# Patient Record
Sex: Female | Born: 1950 | Race: Black or African American | Hispanic: No | Smoking: Never smoker
Health system: Southern US, Community
[De-identification: ages and names within clinical notes are randomized; demographics above are authoritative.]

## PROBLEM LIST (undated history)

## (undated) DIAGNOSIS — M1990 Unspecified osteoarthritis, unspecified site: Secondary | ICD-10-CM

## (undated) HISTORY — PX: LAPAROSCOPIC GASTRIC BANDING: SHX1100

## (undated) HISTORY — DX: Unspecified osteoarthritis, unspecified site: M19.90

---

## 2018-01-21 HISTORY — PX: APPENDECTOMY: SHX54

## 2018-02-08 ENCOUNTER — Emergency Department (HOSPITAL_COMMUNITY): Payer: Medicare Other

## 2018-02-08 ENCOUNTER — Emergency Department (HOSPITAL_COMMUNITY)
Admission: EM | Admit: 2018-02-08 | Discharge: 2018-02-08 | Disposition: A | Discharge status: Home / Self Care | Payer: Medicare Other | Attending: Emergency Medicine | Admitting: Emergency Medicine

## 2018-02-08 ENCOUNTER — Encounter (HOSPITAL_COMMUNITY): Payer: Self-pay | Admitting: Emergency Medicine

## 2018-02-08 ENCOUNTER — Other Ambulatory Visit: Payer: Self-pay

## 2018-02-08 DIAGNOSIS — M25562 Pain in left knee: Secondary | ICD-10-CM | POA: Diagnosis present

## 2018-02-08 DIAGNOSIS — G8929 Other chronic pain: Secondary | ICD-10-CM | POA: Diagnosis not present

## 2018-02-08 MED ORDER — ONDANSETRON 4 MG PO TBDP
4.0000 mg | ORAL_TABLET | Freq: Once | ORAL | Status: AC
  Administered 2018-02-08: 4 mg via ORAL
  Filled 2018-02-08: qty 1

## 2018-02-08 MED ORDER — ONDANSETRON HCL 4 MG PO TABS: 4.0000 mg | ORAL_TABLET | Freq: Four times a day (QID) | ORAL | 0 refills | Status: DC

## 2018-02-08 MED ORDER — HYDROCODONE-ACETAMINOPHEN 5-325 MG PO TABS
1.0000 | ORAL_TABLET | Freq: Once | ORAL | Status: AC
  Administered 2018-02-08: 1 via ORAL
  Filled 2018-02-08: qty 1

## 2018-02-08 MED ORDER — OXYCODONE-ACETAMINOPHEN 5-325 MG PO TABS: 2.0000 | ORAL_TABLET | Freq: Three times a day (TID) | ORAL | 0 refills | Status: DC | PRN

## 2018-02-08 NOTE — ED Triage Notes (Signed)
States has chronic left knee pain and and past 2 days pain left knee worsening and currently 10/10 sharp with swelling. Pedal pulse radial pulse +2 full sensation. Denies trauma. States since the new have been exercising more.

## 2018-02-08 NOTE — Discharge Instructions (Addendum)
You may alternate taking Tylenol and Ibuprofen as needed for pain control. You may take 400-600 mg of ibuprofen every 6 hours and 500mg  of Tylenol every 6 hours. Do not exceed 4000 mg of Tylenol daily as this can lead to liver damage. Also, make sure to take Ibuprofen with meals as it can cause an upset stomach. Do not take other NSAIDs while taking Ibuprofen such as (Aleve, Naprosyn, Aspirin, Celebrex, etc) and do not take more than the prescribed dose as this can lead to ulcers and bleeding in your GI tract. You may use warm and cold compresses to help with your symptoms.   Prescription given for Percocet. You may take one percocet every 8 hours for breakthrough pain.Take medication as directed and do not operate machinery, drive a car, or work while taking this medication as it can make you drowsy.   Please follow up with your primary doctor within the next 7-10 days for re-evaluation and further treatment of your symptoms.  You were Given a referral to an orthopedic doctor.  Please call the office to make an appointment for follow-up  Please return to the ER sooner if you have any new or worsening symptoms.

## 2018-02-08 NOTE — ED Provider Notes (Signed)
[facility] EMERGENCY DEPARTMENT Provider Note   CSN: [id number] Arrival date & time: 02/08/18  1152     History   Chief Complaint Chief Complaint  Patient presents with  . Knee Pain    HPI Julie Richards is a 68 y.o. female.  HPI  Pt is a 68 y/o female who presents to the ED today c/o left knee pain.  Patient states her left knee pain that is chronic.  States she has a history of being a runner and is always had knee pain however over the last several days it seemed to worsen.  Describes her pain as 10/10, sharp with associated swelling.  It is worse with movement and when she tries to walk.  She states that the symptoms started after she has started exercising over the last few week.  She has been walking more.  History reviewed. No pertinent past medical history.  There are no active problems to display for this patient.   History reviewed. No pertinent surgical history.   OB History   No obstetric history on file.      Home Medications    Prior to Admission medications   Medication Sig Start Date End Date Taking? Authorizing Provider  ondansetron (ZOFRAN) 4 MG tablet Take 1 tablet (4 mg total) by mouth every 6 (six) hours. 02/08/18   Ehtan Delfavero S, PA-C  oxyCODONE-acetaminophen (PERCOCET/ROXICET) 5-325 MG tablet Take 2 tablets by mouth every 8 (eight) hours as needed for severe pain. 02/08/18   Glanda Spanbauer S, PA-C    Family History No family history on file.  Social History Social History   Tobacco Use  . Smoking status: Never Smoker  Substance Use Topics  . Alcohol use: Never    Frequency: Never  . Drug use: Never     Allergies   Percocet [oxycodone-acetaminophen]   Review of Systems Review of Systems  Constitutional: Negative for fever.  Musculoskeletal:       Left knee pain  Skin: Negative for color change and wound.     Physical Exam Updated Vital Signs BP 123/73 (BP Location: Right Arm)   Pulse 60   Temp  97.6 F (36.4 C) (Oral)   Resp 16   Ht 5\' 9"  (1.753 m)   Wt 133.8 kg   SpO2 98%   BMI 43.56 kg/m   Physical Exam Vitals signs and nursing note reviewed.  Constitutional:      General: She is not in acute distress.    Appearance: She is well-developed.  HENT:     Head: Normocephalic and atraumatic.  Eyes:     Conjunctiva/sclera: Conjunctivae normal.  Neck:     Musculoskeletal: Neck supple.  Cardiovascular:     Rate and Rhythm: Normal rate.  Pulmonary:     Effort: Pulmonary effort is normal.  Musculoskeletal:     Comments: TTP to the left knee over the medial joint line. There is a mild amount of swelling present however no erythema or warmth. No obvious joint laxity on exam. Is able to bend knee to 90 degrees.   Skin:    General: Skin is warm and dry.  Neurological:     Mental Status: She is alert.      ED Treatments / Results  Labs (all labs ordered are listed, but only abnormal results are displayed) Labs Reviewed - No data to display  EKG None  Radiology Dg Knee Complete 4 Views Left  Result Date: 02/08/2018 CLINICAL DATA:  Left knee pain  on treadmill. EXAM: LEFT KNEE - COMPLETE 4+ VIEW COMPARISON:  None. FINDINGS: No evidence of fracture, dislocation. There are degenerative joint changes of the left knee with narrowed joint space osteophyte formation and a small suprapatellar effusion. Soft tissues are unremarkable. IMPRESSION: No acute fracture or dislocation identified. Moderate osteoarthritic changes of left knee. Electronically Signed   By: Sherian Rein M.D.   On: 02/08/2018 14:49    Procedures Procedures (including critical care time) SPLINT APPLICATION Date/Time: 5:01 PM Authorized by: Karrie Meres Consent: Verbal consent obtained. Risks and benefits: risks, benefits and alternatives were discussed Consent given by: patient Splint applied by: orthopedic technician Location details: LLE Splint type: knee immobilizer Post-procedure: The splinted  body part was neurovascularly unchanged following the procedure. Patient tolerance: Patient tolerated the procedure well with no immediate complications.  Medications Ordered in ED Medications  HYDROcodone-acetaminophen (NORCO/VICODIN) 5-325 MG per tablet 1 tablet (1 tablet Oral Given 02/08/18 1246)  ondansetron (ZOFRAN-ODT) disintegrating tablet 4 mg (4 mg Oral Given 02/08/18 1247)     Initial Impression / Assessment and Plan / ED Course  I have reviewed the triage vital signs and the nursing notes.  Pertinent labs & imaging results that were available during my care of the patient were reviewed by me and considered in my medical decision making (see chart for details).   Final Clinical Impressions(s) / ED Diagnoses   Final diagnoses:  Chronic pain of left knee   Patient presenting with left knee pain that is chronic but has worsened over the last few days started exercising more.  Vitals are stable.  No erythema or warmth to the joint to suggest septic arthritis.  No fevers.  No recent falls or trauma.  On exam has tenderness to the medial aspect of the left knee.  X-ray right knee without evidence of fracture.  Advise anti-inflammatories and pain medications.  Knee immobilizer and crutches for pain comfort.  Will give referral to orthopedics however also advised patient to follow-up with her PCP in regards to symptoms.  Advised to return to the ER for new or worsening symptoms.  She voices understanding of the plan and reasons to return.  All questions answered.   ED Discharge Orders         Ordered    oxyCODONE-acetaminophen (PERCOCET/ROXICET) 5-325 MG tablet  Every 8 hours PRN     02/08/18 1516    ondansetron (ZOFRAN) 4 MG tablet  Every 6 hours     02/08/18 1516           Julie Richards S, PA-C 02/08/18 1701    Derwood Kaplan, MD 02/08/18 1726

## 2018-02-08 NOTE — Progress Notes (Signed)
Orthopedic Tech Progress Note Patient Details:  Julie Richards 1950-10-23 [id number]  Ortho Devices Type of Ortho Device: Crutches, Knee Immobilizer Ortho Device/Splint Location: LLE Ortho Device/Splint Interventions: Adjustment, Application, Ordered   Post Interventions Patient Tolerated: Well Instructions Provided: Care of device, Adjustment of device   Donald Pore 02/08/2018, 1:09 PM

## 2018-05-29 ENCOUNTER — Encounter (HOSPITAL_BASED_OUTPATIENT_CLINIC_OR_DEPARTMENT_OTHER): Payer: Self-pay

## 2018-05-29 DIAGNOSIS — G4710 Hypersomnia, unspecified: Secondary | ICD-10-CM

## 2018-05-29 DIAGNOSIS — R0683 Snoring: Secondary | ICD-10-CM

## 2018-05-29 DIAGNOSIS — R5383 Other fatigue: Secondary | ICD-10-CM

## 2018-06-03 ENCOUNTER — Other Ambulatory Visit: Payer: Self-pay | Admitting: Internal Medicine

## 2018-06-03 DIAGNOSIS — Z1231 Encounter for screening mammogram for malignant neoplasm of breast: Secondary | ICD-10-CM

## 2018-07-09 ENCOUNTER — Other Ambulatory Visit: Payer: Self-pay

## 2018-07-09 ENCOUNTER — Inpatient Hospital Stay (HOSPITAL_COMMUNITY)
Admission: EM | Admit: 2018-07-09 | Discharge: 2018-07-16 | DRG: 372 | Disposition: A | Discharge status: Home / Self Care | Payer: Medicare Other | Attending: Physician Assistant | Admitting: Physician Assistant

## 2018-07-09 ENCOUNTER — Emergency Department (HOSPITAL_COMMUNITY): Payer: Medicare Other

## 2018-07-09 DIAGNOSIS — Z79899 Other long term (current) drug therapy: Secondary | ICD-10-CM | POA: Diagnosis not present

## 2018-07-09 DIAGNOSIS — R109 Unspecified abdominal pain: Secondary | ICD-10-CM | POA: Diagnosis not present

## 2018-07-09 DIAGNOSIS — N179 Acute kidney failure, unspecified: Secondary | ICD-10-CM | POA: Diagnosis not present

## 2018-07-09 DIAGNOSIS — Z885 Allergy status to narcotic agent status: Secondary | ICD-10-CM

## 2018-07-09 DIAGNOSIS — Z79891 Long term (current) use of opiate analgesic: Secondary | ICD-10-CM

## 2018-07-09 DIAGNOSIS — Z1159 Encounter for screening for other viral diseases: Secondary | ICD-10-CM | POA: Diagnosis not present

## 2018-07-09 DIAGNOSIS — K3532 Acute appendicitis with perforation and localized peritonitis, without abscess: Principal | ICD-10-CM | POA: Diagnosis present

## 2018-07-09 LAB — CBC WITH DIFFERENTIAL/PLATELET
Abs Immature Granulocytes: 0.07 10*3/uL (ref 0.00–0.07)
Basophils Absolute: 0 10*3/uL (ref 0.0–0.1)
Basophils Relative: 0 %
Eosinophils Absolute: 0 10*3/uL (ref 0.0–0.5)
Eosinophils Relative: 0 %
HCT: 43.4 % (ref 36.0–46.0)
Hemoglobin: 14 g/dL (ref 12.0–15.0)
Immature Granulocytes: 1 %
Lymphocytes Relative: 16 %
Lymphs Abs: 2.4 10*3/uL (ref 0.7–4.0)
MCH: 27.6 pg (ref 26.0–34.0)
MCHC: 32.3 g/dL (ref 30.0–36.0)
MCV: 85.4 fL (ref 80.0–100.0)
Monocytes Absolute: 0.5 10*3/uL (ref 0.1–1.0)
Monocytes Relative: 3 %
Neutro Abs: 12 10*3/uL — ABNORMAL HIGH (ref 1.7–7.7)
Neutrophils Relative %: 80 %
Platelets: 178 10*3/uL (ref 150–400)
RBC: 5.08 MIL/uL (ref 3.87–5.11)
RDW: 14.1 % (ref 11.5–15.5)
WBC: 14.9 10*3/uL — ABNORMAL HIGH (ref 4.0–10.5)
nRBC: 0 % (ref 0.0–0.2)

## 2018-07-09 LAB — URINALYSIS, ROUTINE W REFLEX MICROSCOPIC
Bilirubin Urine: NEGATIVE
Glucose, UA: NEGATIVE mg/dL
Ketones, ur: NEGATIVE mg/dL
Nitrite: NEGATIVE
Protein, ur: NEGATIVE mg/dL
Specific Gravity, Urine: 1.021 (ref 1.005–1.030)
pH: 6 (ref 5.0–8.0)

## 2018-07-09 LAB — COMPREHENSIVE METABOLIC PANEL
ALT: 19 U/L (ref 0–44)
AST: 19 U/L (ref 15–41)
Albumin: 3.7 g/dL (ref 3.5–5.0)
Alkaline Phosphatase: 77 U/L (ref 38–126)
Anion gap: 11 (ref 5–15)
BUN: 13 mg/dL (ref 8–23)
CO2: 23 mmol/L (ref 22–32)
Calcium: 9.3 mg/dL (ref 8.9–10.3)
Chloride: 102 mmol/L (ref 98–111)
Creatinine, Ser: 1.13 mg/dL — ABNORMAL HIGH (ref 0.44–1.00)
GFR calc Af Amer: 59 mL/min — ABNORMAL LOW (ref >60)
GFR calc non Af Amer: 51 mL/min — ABNORMAL LOW (ref >60)
Glucose, Bld: 172 mg/dL — ABNORMAL HIGH (ref 70–99)
Potassium: 3.7 mmol/L (ref 3.5–5.1)
Sodium: 136 mmol/L (ref 135–145)
Total Bilirubin: 1.6 mg/dL — ABNORMAL HIGH (ref 0.3–1.2)
Total Protein: 7.3 g/dL (ref 6.5–8.1)

## 2018-07-09 LAB — SARS CORONAVIRUS 2 BY RT PCR (HOSPITAL ORDER, PERFORMED IN ~~LOC~~ HOSPITAL LAB): SARS Coronavirus 2: NEGATIVE

## 2018-07-09 LAB — LIPASE, BLOOD: Lipase: 24 U/L (ref 11–51)

## 2018-07-09 MED ORDER — ACETAMINOPHEN 325 MG PO TABS
650.0000 mg | ORAL_TABLET | Freq: Four times a day (QID) | ORAL | Status: DC | PRN
  Administered 2018-07-09 – 2018-07-12 (×2): 650 mg via ORAL
  Filled 2018-07-09 (×2): qty 2

## 2018-07-09 MED ORDER — ONDANSETRON HCL 4 MG/2ML IJ SOLN
4.0000 mg | Freq: Once | INTRAMUSCULAR | Status: AC
  Administered 2018-07-09: 4 mg via INTRAVENOUS
  Filled 2018-07-09: qty 2

## 2018-07-09 MED ORDER — IOHEXOL 300 MG/ML  SOLN
100.0000 mL | Freq: Once | INTRAMUSCULAR | Status: AC | PRN
  Administered 2018-07-09: 100 mL via INTRAVENOUS

## 2018-07-09 MED ORDER — PIPERACILLIN-TAZOBACTAM 3.375 G IVPB
3.3750 g | Freq: Three times a day (TID) | INTRAVENOUS | Status: DC
  Administered 2018-07-09 – 2018-07-14 (×14): 3.375 g via INTRAVENOUS
  Filled 2018-07-09 (×13): qty 50

## 2018-07-09 MED ORDER — SODIUM CHLORIDE 0.9 % IV BOLUS
1000.0000 mL | Freq: Once | INTRAVENOUS | Status: AC
  Administered 2018-07-09: 1000 mL via INTRAVENOUS

## 2018-07-09 MED ORDER — ENOXAPARIN SODIUM 40 MG/0.4ML ~~LOC~~ SOLN
40.0000 mg | SUBCUTANEOUS | Status: DC
  Administered 2018-07-09 – 2018-07-14 (×6): 40 mg via SUBCUTANEOUS
  Filled 2018-07-09 (×7): qty 0.4

## 2018-07-09 MED ORDER — ONDANSETRON HCL 4 MG/2ML IJ SOLN: 4.0000 mg | Freq: Four times a day (QID) | INTRAMUSCULAR | Status: DC | PRN

## 2018-07-09 MED ORDER — MORPHINE SULFATE (PF) 4 MG/ML IV SOLN
4.0000 mg | Freq: Once | INTRAVENOUS | Status: AC
  Administered 2018-07-09: 4 mg via INTRAVENOUS
  Filled 2018-07-09: qty 1

## 2018-07-09 MED ORDER — SODIUM CHLORIDE 0.9 % IV SOLN
2.0000 g | Freq: Once | INTRAVENOUS | Status: AC
  Administered 2018-07-09: 2 g via INTRAVENOUS
  Filled 2018-07-09: qty 20

## 2018-07-09 MED ORDER — KCL IN DEXTROSE-NACL 20-5-0.9 MEQ/L-%-% IV SOLN
INTRAVENOUS | Status: DC
  Administered 2018-07-09 – 2018-07-13 (×9): via INTRAVENOUS
  Filled 2018-07-09 (×10): qty 1000

## 2018-07-09 MED ORDER — ONDANSETRON 4 MG PO TBDP
4.0000 mg | ORAL_TABLET | Freq: Four times a day (QID) | ORAL | Status: DC | PRN
  Filled 2018-07-09: qty 1

## 2018-07-09 MED ORDER — DICYCLOMINE HCL 10 MG PO CAPS
10.0000 mg | ORAL_CAPSULE | Freq: Once | ORAL | Status: AC
  Administered 2018-07-09: 10 mg via ORAL
  Filled 2018-07-09: qty 1

## 2018-07-09 MED ORDER — HYDRALAZINE HCL 20 MG/ML IJ SOLN: 10.0000 mg | INTRAMUSCULAR | Status: DC | PRN

## 2018-07-09 MED ORDER — FENTANYL CITRATE (PF) 100 MCG/2ML IJ SOLN
50.0000 ug | INTRAMUSCULAR | Status: DC | PRN
  Administered 2018-07-10 (×4): 50 ug via INTRAVENOUS
  Filled 2018-07-09 (×4): qty 2

## 2018-07-09 MED ORDER — ACETAMINOPHEN 650 MG RE SUPP: 650.0000 mg | Freq: Four times a day (QID) | RECTAL | Status: DC | PRN

## 2018-07-09 MED ORDER — METRONIDAZOLE IN NACL 5-0.79 MG/ML-% IV SOLN
500.0000 mg | Freq: Once | INTRAVENOUS | Status: AC
  Administered 2018-07-09: 500 mg via INTRAVENOUS
  Filled 2018-07-09: qty 100

## 2018-07-09 MED ORDER — MORPHINE SULFATE (PF) 4 MG/ML IV SOLN
4.0000 mg | Freq: Once | INTRAVENOUS | Status: AC
  Administered 2018-07-09: 14:00:00 4 mg via INTRAVENOUS
  Filled 2018-07-09: qty 1

## 2018-07-09 NOTE — H&P (Signed)
Julie Richards is an 68 y.o. female.   Chief Complaint: Abdominal pain x1 day HPI: Asked to see patient at request of EDP for abdominal pain.  She complains of a 1 day history of abdominal pain that started yesterday.  The pain progressed overnight and is described as sharp crampy in her lower abdomen.  There is no radiation.  Her pain worsened today in her lower abdomen now is more in her right lower quadrant.  Denies any dysuria polyuria or blood in her stool.  She is had no nausea vomiting.  Pain is sharp in nature made worse with movement.  CT scan shows perforated appendicitis without abscess or free air or free fluid.  No past medical history on file.  No past surgical history on file.  No family history on file. Social History:  reports that she has never smoked. She does not have any smokeless tobacco history on file. She reports that she does not drink alcohol or use drugs.  Allergies:  Allergies  Allergen Reactions  . Percocet [Oxycodone-Acetaminophen] Nausea And Vomiting    (Not in a hospital admission)   Results for orders placed or performed during the hospital encounter of 07/09/18 (from the past 48 hour(s))  Comprehensive metabolic panel     Status: Abnormal   Collection Time: 07/09/18  1:48 PM  Result Value Ref Range   Sodium 136 135 - 145 mmol/L   Potassium 3.7 3.5 - 5.1 mmol/L   Chloride 102 98 - 111 mmol/L   CO2 23 22 - 32 mmol/L   Glucose, Bld 172 (H) 70 - 99 mg/dL   BUN 13 8 - 23 mg/dL   Creatinine, Ser 6.961.13 (H) 0.44 - 1.00 mg/dL   Calcium 9.3 8.9 - 29.510.3 mg/dL   Total Protein 7.3 6.5 - 8.1 g/dL   Albumin 3.7 3.5 - 5.0 g/dL   AST 19 15 - 41 U/L   ALT 19 0 - 44 U/L   Alkaline Phosphatase 77 38 - 126 U/L   Total Bilirubin 1.6 (H) 0.3 - 1.2 mg/dL   GFR calc non Af Amer 51 (L) >60 mL/min   GFR calc Af Amer 59 (L) >60 mL/min   Anion gap 11 5 - 15    Comment: Performed at [facility], [address] [place], [place] [address]  CBC with Differential      Status: Abnormal   Collection Time: 07/09/18  1:48 PM  Result Value Ref Range   WBC 14.9 (H) 4.0 - 10.5 K/uL   RBC 5.08 3.87 - 5.11 MIL/uL   Hemoglobin 14.0 12.0 - 15.0 g/dL   HCT 24.443.4 01.036.0 - 27.246.0 %   MCV 85.4 80.0 - 100.0 fL   MCH 27.6 26.0 - 34.0 pg   MCHC 32.3 30.0 - 36.0 g/dL   RDW 53.614.1 64.411.5 - 03.415.5 %   Platelets 178 150 - 400 K/uL   nRBC 0.0 0.0 - 0.2 %   Neutrophils Relative % 80 %   Neutro Abs 12.0 (H) 1.7 - 7.7 K/uL   Lymphocytes Relative 16 %   Lymphs Abs 2.4 0.7 - 4.0 K/uL   Monocytes Relative 3 %   Monocytes Absolute 0.5 0.1 - 1.0 K/uL   Eosinophils Relative 0 %   Eosinophils Absolute 0.0 0.0 - 0.5 K/uL   Basophils Relative 0 %   Basophils Absolute 0.0 0.0 - 0.1 K/uL   Immature Granulocytes 1 %   Abs Immature Granulocytes 0.07 0.00 - 0.07 K/uL  Comment: Performed at [facility], [address] [place], [place] [address]  Lipase, blood     Status: None   Collection Time: 07/09/18  1:48 PM  Result Value Ref Range   Lipase 24 11 - 51 U/L    Comment: Performed at [facility], [address] [place], [place] [address]  Urinalysis, Routine w reflex microscopic     Status: Abnormal   Collection Time: 07/09/18  4:21 PM  Result Value Ref Range   Color, Urine YELLOW YELLOW   APPearance CLEAR CLEAR   Specific Gravity, Urine 1.021 1.005 - 1.030   pH 6.0 5.0 - 8.0   Glucose, UA NEGATIVE NEGATIVE mg/dL   Hgb urine dipstick MODERATE (A) NEGATIVE   Bilirubin Urine NEGATIVE NEGATIVE   Ketones, ur NEGATIVE NEGATIVE mg/dL   Protein, ur NEGATIVE NEGATIVE mg/dL   Nitrite NEGATIVE NEGATIVE   Leukocytes,Ua SMALL (A) NEGATIVE   RBC / HPF 0-5 0 - 5 RBC/hpf   WBC, UA 0-5 0 - 5 WBC/hpf   Bacteria, UA RARE (A) NONE SEEN   Squamous Epithelial / LPF 0-5 0 - 5   Mucus PRESENT     Comment: Performed at [facility], [address] [place], [place] [address]   Ct Abdomen Pelvis W Contrast  Result Date: 07/09/2018 CLINICAL DATA:  Abdominal pain EXAM: CT ABDOMEN  AND PELVIS WITH CONTRAST TECHNIQUE: Multidetector CT imaging of the abdomen and pelvis was performed using the standard protocol following bolus administration of intravenous contrast. CONTRAST:  100mL OMNIPAQUE IOHEXOL 300 MG/ML  SOLN COMPARISON:  None. FINDINGS: Lower chest: No acute abnormality. Hepatobiliary: No focal liver abnormality is seen. No gallstones, gallbladder wall thickening, or biliary dilatation. Pancreas: Unremarkable. No pancreatic ductal dilatation or surrounding inflammatory changes. Spleen: Normal in size without focal abnormality. Adrenals/Urinary Tract: Adrenal glands are unremarkable. Kidneys are normal, without renal calculi, focal lesion, or hydronephrosis. Bladder is unremarkable. Stomach/Bowel: Prior gastric banding in satisfactory position. Small hiatal hernia. No bowel dilatation to suggest obstruction. Dilated appendix measuring 14 mm with severe surrounding inflammatory changes most consistent with acute appendicitis. There are a few locules air outside of the appendix collectively measuring approximately 18 mm, but adjacent to the appendix most concerning for perforated acute appendicitis. No drainable fluid collection. No pneumatosis, pneumoperitoneum or portal venous gas. Vascular/Lymphatic: No significant vascular findings are present. No enlarged abdominal or pelvic lymph nodes. Reproductive: Uterus and bilateral adnexa are unremarkable. Dystrophic calcification in the uterine fundus likely reflecting a small degenerated fibroid. Other: No abdominal wall hernia or abnormality. No abdominopelvic ascites. Musculoskeletal: No acute osseous abnormality. No aggressive osseous lesion. Bilateral facet arthropathy at L4-5 and L5-S1. IMPRESSION: 1. Dilated appendix with severe periappendiceal inflammatory changes and a few locules of air outside of, but adjacent to, the appendix most concerning for acute perforated appendicitis. Perforation appears contained and there is no drainable  fluid collection to suggest an abscess at this time. Electronically Signed   By: Elige KoHetal  Patel   On: 07/09/2018 16:25    Review of Systems  Constitutional: Negative for fever and malaise/fatigue.  Respiratory: Negative for cough and shortness of breath.   Gastrointestinal: Positive for abdominal pain. Negative for blood in stool, diarrhea and vomiting.    Blood pressure (!) 143/84, pulse 80, temperature 99.3 F (37.4 C), temperature source Oral, resp. rate 18, height 5\' 8"  (1.727 m), weight 134.7 kg, SpO2 98 %. Physical Exam  Constitutional: She is oriented to person, place, and time. She appears  well-developed and well-nourished.  HENT:  Head: Normocephalic and atraumatic.  Eyes: Pupils are equal, round, and reactive to light. EOM are normal.  Neck: Normal range of motion. Neck supple.  Cardiovascular: Normal rate.  Respiratory: Effort normal.  GI: There is abdominal tenderness in the right lower quadrant. There is rebound, guarding and tenderness at McBurney's point. There is no rigidity.  Musculoskeletal: Normal range of motion.  Neurological: She is alert and oriented to person, place, and time.  Psychiatric: She has a normal mood and affect. Her behavior is normal. Judgment and thought content normal.     Assessment/Plan Perforated appendicitis without abscess  Discussed nonoperative management which includes IV antibiotics for now.  There is no role for acute surgical intervention at this point which I explained to her.  Explained if her condition does worsen though she WOULD  require laparotomy at that point in  Time.    she is stable and will proceed with antibiotic treatment.  There is no drainable abscess at this point time but she may need reimaging in a few days to follow that.  Turner Daniels, MD 07/09/2018, 6:20 PM

## 2018-07-09 NOTE — ED Notes (Signed)
Spoke with patient's daughter, Burton Apley, relayed that CT showed appendicitis, surgery had been called and patient would likely be admitted for surgery. Daughter verbalized understanding of plan and thanked this Probation officer for the update.

## 2018-07-09 NOTE — ED Triage Notes (Signed)
Pt states that she had a pre made salad from [employer] yesterday afternoon and since then her stomach has been hurting ; pt denies any n/v/d ; pt states she has taken tums with no relief

## 2018-07-09 NOTE — ED Notes (Signed)
ED TO INPATIENT HANDOFF REPORT  ED Nurse Name and Phone #: [phone]  S Name/Age/Gender Julie Richards 68 y.o. female Room/Bed: 038C/038C  Code Status   Code Status: Not on file  Home/SNF/Other Home Patient oriented to: self, place, time and situation Is this baseline? Yes   Triage Complete: Triage complete  Chief Complaint abd pain  Triage Note Pt states that she had a pre made salad from [employer] yesterday afternoon and since then her stomach has been hurting ; pt denies any n/v/d ; pt states she has taken tums with no relief    Allergies Allergies  Allergen Reactions  . Percocet [Oxycodone-Acetaminophen] Nausea And Vomiting    Level of Care/Admitting Diagnosis ED Disposition    ED Disposition Condition Comment   Admit  [facility] [[id number]]  Level of Care: Med-Surg [16]  Covid Evaluation: Screening Protocol (No Symptoms)  Diagnosis: Perforated appendicitis [409811][[id number]]  Admitting Physician: CCS, MD [[id number]]  Attending Physician: CCS, MD [[id number]]  Estimated length of stay: past midnight tomorrow  Certification:: I certify this patient will need inpatient services for at least 2 midnights  PT Class (Do Not Modify): Inpatient [101]  PT Acc Code (Do Not Modify): Private [1]       B Medical/Surgery History No past medical history on file. No past surgical history on file.   A IV Location/Drains/Wounds Patient Lines/Drains/Airways Status   Active Line/Drains/Airways    Name:   Placement date:   Placement time:   Site:   Days:   Peripheral IV 07/09/18 Right Antecubital   07/09/18    1349    Antecubital   less than 1          Intake/Output Last 24 hours  Intake/Output Summary (Last 24 hours) at 07/09/2018 1855 Last data filed at 07/09/2018 1733 Gross per 24 hour  Intake 1100 ml  Output -  Net 1100 ml    Labs/Imaging Results for orders placed or performed during the hospital encounter of 07/09/18 (from the past 48 hour(s))   Comprehensive metabolic panel     Status: Abnormal   Collection Time: 07/09/18  1:48 PM  Result Value Ref Range   Sodium 136 135 - 145 mmol/L   Potassium 3.7 3.5 - 5.1 mmol/L   Chloride 102 98 - 111 mmol/L   CO2 23 22 - 32 mmol/L   Glucose, Bld 172 (H) 70 - 99 mg/dL   BUN 13 8 - 23 mg/dL   Creatinine, Ser 9.141.13 (H) 0.44 - 1.00 mg/dL   Calcium 9.3 8.9 - 78.210.3 mg/dL   Total Protein 7.3 6.5 - 8.1 g/dL   Albumin 3.7 3.5 - 5.0 g/dL   AST 19 15 - 41 U/L   ALT 19 0 - 44 U/L   Alkaline Phosphatase 77 38 - 126 U/L   Total Bilirubin 1.6 (H) 0.3 - 1.2 mg/dL   GFR calc non Af Amer 51 (L) >60 mL/min   GFR calc Af Amer 59 (L) >60 mL/min   Anion gap 11 5 - 15    Comment: Performed at [facility], [address] [place], [place] [address]  CBC with Differential     Status: Abnormal   Collection Time: 07/09/18  1:48 PM  Result Value Ref Range   WBC 14.9 (H) 4.0 - 10.5 K/uL   RBC 5.08 3.87 - 5.11 MIL/uL   Hemoglobin 14.0 12.0 - 15.0 g/dL   HCT 30.843.4 65.736.0 - 84.646.0 %   MCV 85.4 80.0 -  100.0 fL   MCH 27.6 26.0 - 34.0 pg   MCHC 32.3 30.0 - 36.0 g/dL   RDW 16.114.1 09.611.5 - 04.515.5 %   Platelets 178 150 - 400 K/uL   nRBC 0.0 0.0 - 0.2 %   Neutrophils Relative % 80 %   Neutro Abs 12.0 (H) 1.7 - 7.7 K/uL   Lymphocytes Relative 16 %   Lymphs Abs 2.4 0.7 - 4.0 K/uL   Monocytes Relative 3 %   Monocytes Absolute 0.5 0.1 - 1.0 K/uL   Eosinophils Relative 0 %   Eosinophils Absolute 0.0 0.0 - 0.5 K/uL   Basophils Relative 0 %   Basophils Absolute 0.0 0.0 - 0.1 K/uL   Immature Granulocytes 1 %   Abs Immature Granulocytes 0.07 0.00 - 0.07 K/uL    Comment: Performed at [facility], [address] [place], [place] [address]  Lipase, blood     Status: None   Collection Time: 07/09/18  1:48 PM  Result Value Ref Range   Lipase 24 11 - 51 U/L    Comment: Performed at [facility], [address] [place], [place] [address]  Urinalysis, Routine w reflex microscopic     Status: Abnormal    Collection Time: 07/09/18  4:21 PM  Result Value Ref Range   Color, Urine YELLOW YELLOW   APPearance CLEAR CLEAR   Specific Gravity, Urine 1.021 1.005 - 1.030   pH 6.0 5.0 - 8.0   Glucose, UA NEGATIVE NEGATIVE mg/dL   Hgb urine dipstick MODERATE (A) NEGATIVE   Bilirubin Urine NEGATIVE NEGATIVE   Ketones, ur NEGATIVE NEGATIVE mg/dL   Protein, ur NEGATIVE NEGATIVE mg/dL   Nitrite NEGATIVE NEGATIVE   Leukocytes,Ua SMALL (A) NEGATIVE   RBC / HPF 0-5 0 - 5 RBC/hpf   WBC, UA 0-5 0 - 5 WBC/hpf   Bacteria, UA RARE (A) NONE SEEN   Squamous Epithelial / LPF 0-5 0 - 5   Mucus PRESENT     Comment: Performed at [facility], [address] [place], [place] [address]  SARS Coronavirus 2 (CEPHEID - Performed in [facility] hospital lab), Hosp Order     Status: None   Collection Time: 07/09/18  5:06 PM   Specimen: Nasopharyngeal Swab  Result Value Ref Range   SARS Coronavirus 2 NEGATIVE NEGATIVE    Comment: (NOTE) If result is NEGATIVE SARS-CoV-2 target nucleic acids are NOT DETECTED. The SARS-CoV-2 RNA is generally detectable in upper and lower  respiratory specimens during the acute phase of infection. The lowest  concentration of SARS-CoV-2 viral copies this assay can detect is 250  copies / mL. A negative result does not preclude SARS-CoV-2 infection  and should not be used as the sole basis for treatment or other  patient management decisions.  A negative result may occur with  improper specimen collection / handling, submission of specimen other  than nasopharyngeal swab, presence of viral mutation(s) within the  areas targeted by this assay, and inadequate number of viral copies  (<250 copies / mL). A negative result must be combined with clinical  observations, patient history, and epidemiological information. If result is POSITIVE SARS-CoV-2 target nucleic acids are DETECTED. The SARS-CoV-2 RNA is generally detectable in upper and lower  respiratory specimens dur ing the  acute phase of infection.  Positive  results are indicative of active infection with SARS-CoV-2.  Clinical  correlation with patient history and other diagnostic information is  necessary to determine patient infection  status.  Positive results do  not rule out bacterial infection or co-infection with other viruses. If result is PRESUMPTIVE POSTIVE SARS-CoV-2 nucleic acids MAY BE PRESENT.   A presumptive positive result was obtained on the submitted specimen  and confirmed on repeat testing.  While 2019 novel coronavirus  (SARS-CoV-2) nucleic acids may be present in the submitted sample  additional confirmatory testing may be necessary for epidemiological  and / or clinical management purposes  to differentiate between  SARS-CoV-2 and other Sarbecovirus currently known to infect humans.  If clinically indicated additional testing with an alternate test  methodology [phone]) is advised. The SARS-CoV-2 RNA is generally  detectable in upper and lower respiratory sp ecimens during the acute  phase of infection. The expected result is Negative. Fact Sheet for Patients:  StrictlyIdeas.no Fact Sheet for Healthcare Providers: BankingDealers.co.za This test is not yet approved or cleared by the [place] FDA and has been authorized for detection and/or diagnosis of SARS-CoV-2 by FDA under an Emergency Use Authorization (EUA).  This EUA will remain in effect (meaning this test can be used) for the duration of the COVID-19 declaration under Section 564(b)(1) of the Act, 21 U.S.C. section 360bbb-3(b)(1), unless the authorization is terminated or revoked sooner. Performed at [facility], [address] [place], [place] [address]    Ct Abdomen Pelvis W Contrast  Result Date: 07/09/2018 CLINICAL DATA:  Abdominal pain EXAM: CT ABDOMEN AND PELVIS WITH CONTRAST TECHNIQUE: Multidetector CT imaging of the abdomen and pelvis was performed using  the standard protocol following bolus administration of intravenous contrast. CONTRAST:  175mL OMNIPAQUE IOHEXOL 300 MG/ML  SOLN COMPARISON:  None. FINDINGS: Lower chest: No acute abnormality. Hepatobiliary: No focal liver abnormality is seen. No gallstones, gallbladder wall thickening, or biliary dilatation. Pancreas: Unremarkable. No pancreatic ductal dilatation or surrounding inflammatory changes. Spleen: Normal in size without focal abnormality. Adrenals/Urinary Tract: Adrenal glands are unremarkable. Kidneys are normal, without renal calculi, focal lesion, or hydronephrosis. Bladder is unremarkable. Stomach/Bowel: Prior gastric banding in satisfactory position. Small hiatal hernia. No bowel dilatation to suggest obstruction. Dilated appendix measuring 14 mm with severe surrounding inflammatory changes most consistent with acute appendicitis. There are a few locules air outside of the appendix collectively measuring approximately 18 mm, but adjacent to the appendix most concerning for perforated acute appendicitis. No drainable fluid collection. No pneumatosis, pneumoperitoneum or portal venous gas. Vascular/Lymphatic: No significant vascular findings are present. No enlarged abdominal or pelvic lymph nodes. Reproductive: Uterus and bilateral adnexa are unremarkable. Dystrophic calcification in the uterine fundus likely reflecting a small degenerated fibroid. Other: No abdominal wall hernia or abnormality. No abdominopelvic ascites. Musculoskeletal: No acute osseous abnormality. No aggressive osseous lesion. Bilateral facet arthropathy at L4-5 and L5-S1. IMPRESSION: 1. Dilated appendix with severe periappendiceal inflammatory changes and a few locules of air outside of, but adjacent to, the appendix most concerning for acute perforated appendicitis. Perforation appears contained and there is no drainable fluid collection to suggest an abscess at this time. Electronically Signed   By: Kathreen Devoid   On:  07/09/2018 16:25    Pending Labs Unresulted Labs (From admission, onward)    Start     Ordered   Signed and Held  HIV antibody (Routine Testing)  Once,   R     Signed and Held   Signed and Held  Comprehensive metabolic panel  Tomorrow morning,   R     Signed and Held   Signed and Held  CBC  Tomorrow morning,  R     Signed and Held          Vitals/Pain Today's Vitals   07/09/18 1502 07/09/18 1645 07/09/18 1837 07/09/18 1840  BP:  (!) 143/84 (!) 150/85   Pulse:  (!) 104 (!) 117   Resp:  18 19   Temp:   99.2 F (37.3 C)   TempSrc:   Oral   SpO2:  98% 98%   Weight:      Height:      PainSc: 6  8   2      Isolation Precautions No active isolations  Medications Medications  cefTRIAXone (ROCEPHIN) 2 g in sodium chloride 0.9 % 100 mL IVPB (0 g Intravenous Stopped 07/09/18 1733)    And  metroNIDAZOLE (FLAGYL) IVPB 500 mg (500 mg Intravenous New Bag/Given 07/09/18 1848)  morphine 4 MG/ML injection 4 mg (4 mg Intravenous Given 07/09/18 1356)  sodium chloride 0.9 % bolus 1,000 mL (0 mLs Intravenous Stopped 07/09/18 1502)  ondansetron (ZOFRAN) injection 4 mg (4 mg Intravenous Given 07/09/18 1356)  dicyclomine (BENTYL) capsule 10 mg (10 mg Oral Given 07/09/18 1358)  iohexol (OMNIPAQUE) 300 MG/ML solution 100 mL (100 mLs Intravenous Contrast Given 07/09/18 1540)  morphine 4 MG/ML injection 4 mg (4 mg Intravenous Given 07/09/18 1700)    Mobility walks Low fall risk   Focused Assessments  R Recommendations: See Admitting Provider Note  Report given to:   Additional Notes:

## 2018-07-09 NOTE — ED Notes (Signed)
Report attempted, RN to call back. 

## 2018-07-09 NOTE — ED Provider Notes (Signed)
[facility] EMERGENCY [facility]PARTMENT Provider Note   CSN: [id number] Arrival date & time: 07/09/18  1312     History   Chief Complaint Chief Complaint  Patient presents with  . Abdominal Pain    HPI Julie Richards is a 68 y.o. female who presents with abdominal pain.  No significant past medical history.  She states "I'm sure it's gastroenteritis". She states that yesterday for lunch she ate a chef salad that was premade from [employer].  Yesterday evening she started to have lower abdominal cramping.  Today the cramping has become severe in nature.  It comes in waves.  Nothing makes it better or worse. She is having a lot of belching, bloating, and passing gas. She denies fever, chills, chest pain, shortness of breath, nausea, vomiting, diarrhea.  LBM was 2 days ago. She says it burns a little bit when she urinates.  She tried over-the-counter medicines without relief.  Past surgical history significant for lap band. She ate some blueberries around 7AM this morning.     HPI  No past medical history on file.  There are no active problems to display for this patient.   No past surgical history on file.   OB History   No obstetric history on file.      Home Medications    Prior to Admission medications   Medication Sig Start Date End Date Taking? Authorizing Provider  ondansetron (ZOFRAN) 4 MG tablet Take 1 tablet (4 mg total) by mouth every 6 (six) hours. 02/08/18   Couture, Cortni S, PA-C  oxyCODONE-acetaminophen (PERCOCET/ROXICET) 5-325 MG tablet Take 2 tablets by mouth every 8 (eight) hours as needed for severe pain. 02/08/18   Couture, Cortni S, PA-C    Family History No family history on file.  Social History Social History   Tobacco Use  . Smoking status: Never Smoker  Substance Use Topics  . Alcohol use: Never    Frequency: Never  . Drug use: Never     Allergies   Percocet [oxycodone-acetaminophen]   Review of Systems Review of Systems   Constitutional: Negative for fever.  Respiratory: Negative for shortness of breath.   Cardiovascular: Negative for chest pain.  Gastrointestinal: Positive for abdominal pain. Negative for blood in stool, constipation, diarrhea, nausea and vomiting.  Genitourinary: Positive for dysuria. Negative for difficulty urinating and flank pain.  All other systems reviewed and are negative.    Physical Exam Updated Vital Signs BP 109/66   Pulse 97   Temp 99.3 F (37.4 C) (Oral)   Resp 19   Ht 5\' 8"  (1.727 m)   Wt 134.7 kg   SpO2 98%   BMI 45.16 kg/m   Physical Exam Vitals signs and nursing note reviewed.  Constitutional:      General: She is not in acute distress.    Appearance: She is well-developed. She is obese. She is not ill-appearing.  HENT:     Head: Normocephalic and atraumatic.  Eyes:     General: No scleral icterus.       Right eye: No discharge.        Left eye: No discharge.     Conjunctiva/sclera: Conjunctivae normal.     Pupils: Pupils are equal, round, and reactive to light.  Neck:     Musculoskeletal: Normal range of motion.  Cardiovascular:     Rate and Rhythm: Normal rate and regular rhythm.  Pulmonary:     Effort: Pulmonary effort is normal. No respiratory distress.  Breath sounds: Normal breath sounds.  Abdominal:     General: Abdomen is protuberant. There is no distension.     Palpations: Abdomen is soft.     Tenderness: There is abdominal tenderness (diffuse, worse across the lower abdomen).  Skin:    General: Skin is warm and dry.  Neurological:     Mental Status: She is alert and oriented to person, place, and time.  Psychiatric:        Behavior: Behavior normal.      ED Treatments / Results  Labs (all labs ordered are listed, but only abnormal results are displayed) Labs Reviewed  COMPREHENSIVE METABOLIC PANEL - Abnormal; Notable for the following components:      Result Value   Glucose, Bld 172 (*)    Creatinine, Ser 1.13 (*)    Total  Bilirubin 1.6 (*)    GFR calc non Af Amer 51 (*)    GFR calc Af Amer 59 (*)    All other components within normal limits  CBC WITH DIFFERENTIAL/PLATELET - Abnormal; Notable for the following components:   WBC 14.9 (*)    Neutro Abs 12.0 (*)    All other components within normal limits  LIPASE, BLOOD  URINALYSIS, ROUTINE W REFLEX MICROSCOPIC    EKG    Radiology No results found.  Procedures Procedures (including critical care time)  Medications Ordered in ED Medications  morphine 4 MG/ML injection 4 mg (4 mg Intravenous Given 07/09/18 1356)  sodium chloride 0.9 % bolus 1,000 mL (0 mLs Intravenous Stopped 07/09/18 1502)  ondansetron (ZOFRAN) injection 4 mg (4 mg Intravenous Given 07/09/18 1356)  dicyclomine (BENTYL) capsule 10 mg (10 mg Oral Given 07/09/18 1358)     Initial Impression / Assessment and Plan / ED Course  I have reviewed the triage vital signs and the nursing notes.  Pertinent labs & imaging results that were available during my care of the patient were reviewed by me and considered in my medical decision making (see chart for details).  Clinical Course as of Jul 08 1649  Thu Jul 09, 2018  [id number] 68 year old female here with lower abdominal pain since yesterday possibly attributed to some food.  She is obese and has a soft abdomen but does have some focal tenderness in her right lower quadrant.  She has an elevated white count and her CT is suspicious for appendicitis.  Will consult surgery for evaluation.   [MB]    Clinical Course User Index [MB] Terrilee FilesButler, Michael C, MD    68 year old female presents with acute abdominal pain since yesterday.  She believes she has gastroenteritis after eating a salad from [employer].  Her vital signs are normal here.  She is generally tender but more tender in the lower abdomen.  Will obtain CBC CMP, lipase, urine as well as CT of the abdomen and pelvis.  Doubt gastroenteritis that she has not had any nausea vomiting diarrhea.  Will  give fluids and pain control.  3:20 PM Rechecked pt. Pain is 5/10. On repeat exam she is still having diffuse lower abdominal tenderness.  CBC shows leukocytosis of 14.9.  CMP is remarkable for hyperglycemia, mild elevated creatinine, mild elevated bilirubin.  UA appears clean  CT shows acute perforated appendicitis without abscess.  Shared visit with Dr. Charm BargesButler.  She was given Rocephin and Flagyl.  6:01 PM Discussed with Dr. Luisa Hartornett with surgery. He will come to see pt.   Final Clinical Impressions(s) / ED Diagnoses   Final diagnoses:  Acute  perforated appendicitis    ED Discharge Orders    None       Iris Pert 07/09/18 1803    Hayden Rasmussen, MD 07/10/18 1045

## 2018-07-10 ENCOUNTER — Other Ambulatory Visit: Payer: Self-pay

## 2018-07-10 ENCOUNTER — Encounter (HOSPITAL_COMMUNITY): Payer: Self-pay

## 2018-07-10 LAB — CBC
HCT: 39.4 % (ref 36.0–46.0)
Hemoglobin: 12.8 g/dL (ref 12.0–15.0)
MCH: 27.3 pg (ref 26.0–34.0)
MCHC: 32.5 g/dL (ref 30.0–36.0)
MCV: 84 fL (ref 80.0–100.0)
Platelets: 156 10*3/uL (ref 150–400)
RBC: 4.69 MIL/uL (ref 3.87–5.11)
RDW: 14.3 % (ref 11.5–15.5)
WBC: 20.2 10*3/uL — ABNORMAL HIGH (ref 4.0–10.5)
nRBC: 0 % (ref 0.0–0.2)

## 2018-07-10 LAB — COMPREHENSIVE METABOLIC PANEL
ALT: 15 U/L (ref 0–44)
AST: 21 U/L (ref 15–41)
Albumin: 3.1 g/dL — ABNORMAL LOW (ref 3.5–5.0)
Alkaline Phosphatase: 73 U/L (ref 38–126)
Anion gap: 9 (ref 5–15)
BUN: 17 mg/dL (ref 8–23)
CO2: 23 mmol/L (ref 22–32)
Calcium: 8.7 mg/dL — ABNORMAL LOW (ref 8.9–10.3)
Chloride: 106 mmol/L (ref 98–111)
Creatinine, Ser: 1.27 mg/dL — ABNORMAL HIGH (ref 0.44–1.00)
GFR calc Af Amer: 51 mL/min — ABNORMAL LOW (ref >60)
GFR calc non Af Amer: 44 mL/min — ABNORMAL LOW (ref >60)
Glucose, Bld: 164 mg/dL — ABNORMAL HIGH (ref 70–99)
Potassium: 3.5 mmol/L (ref 3.5–5.1)
Sodium: 138 mmol/L (ref 135–145)
Total Bilirubin: 1.9 mg/dL — ABNORMAL HIGH (ref 0.3–1.2)
Total Protein: 6.6 g/dL (ref 6.5–8.1)

## 2018-07-10 LAB — HIV ANTIBODY (ROUTINE TESTING W REFLEX): HIV Screen 4th Generation wRfx: NONREACTIVE

## 2018-07-10 MED ORDER — HYDROMORPHONE HCL 1 MG/ML IJ SOLN
1.0000 mg | INTRAMUSCULAR | Status: DC | PRN
  Administered 2018-07-10 (×2): 1 mg via INTRAVENOUS
  Administered 2018-07-10: 11:00:00 2 mg via INTRAVENOUS
  Administered 2018-07-11 – 2018-07-13 (×11): 1 mg via INTRAVENOUS
  Administered 2018-07-13 – 2018-07-14 (×2): 2 mg via INTRAVENOUS
  Filled 2018-07-10 (×3): qty 1
  Filled 2018-07-10 (×2): qty 2
  Filled 2018-07-10: qty 1
  Filled 2018-07-10: qty 2
  Filled 2018-07-10 (×9): qty 1

## 2018-07-10 NOTE — Plan of Care (Signed)
  Problem: Clinical Measurements: Goal: Ability to maintain clinical measurements within normal limits will improve Outcome: Progressing Goal: Will remain free from infection Outcome: Progressing   Problem: Coping: Goal: Level of anxiety will decrease Outcome: Progressing   Problem: Pain Managment: Goal: General experience of comfort will improve Outcome: Progressing   Problem: Safety: Goal: Ability to remain free from injury will improve Outcome: Progressing   

## 2018-07-10 NOTE — Progress Notes (Signed)
Subjective/Chief Complaint: Pt with con't abdominal pain   Objective: Vital signs in last 24 hours: Temp:  [99 F (37.2 C)-100.9 F (38.3 C)] 99 F (37.2 C) (06/19 0543) Pulse Rate:  [85-117] 94 (06/19 0543) Resp:  [17-32] 17 (06/19 0543) BP: (102-150)/(63-85) 106/69 (06/19 0543) SpO2:  [94 %-100 %] 100 % (06/19 0543) Weight:  [134.7 kg] 134.7 kg (06/18 2009) Last BM Date: 07/09/18  Intake/Output from previous day: 06/18 0701 - 06/19 0700 In: 1991.7 [P.O.:60; I.V.:689.9; IV Piggyback:1241.8] Out: -  Intake/Output this shift: No intake/output data recorded.  Constitutional: No acute distress, conversant, appears states age. Eyes: Anicteric sclerae, moist conjunctiva, no lid lag Lungs: Clear to auscultation bilaterally, normal respiratory effort CV: regular rate and rhythm, no murmurs, no peripheral edema, pedal pulses 2+ GI: Soft, no masses or hepatosplenomegaly, tender to palpation RLQ, no gaurding Skin: No rashes, palpation reveals normal turgor Psychiatric: appropriate judgment and insight, oriented to person, place, and time   Lab Results:  Recent Labs    07/09/18 1348 07/10/18 0257  WBC 14.9* 20.2*  HGB 14.0 12.8  HCT 43.4 39.4  PLT 178 156   BMET Recent Labs    07/09/18 1348 07/10/18 0257  NA 136 138  K 3.7 3.5  CL 102 106  CO2 23 23  GLUCOSE 172* 164*  BUN 13 17  CREATININE 1.13* 1.27*  CALCIUM 9.3 8.7*   Studies/Results: Ct Abdomen Pelvis W Contrast  Result Date: 07/09/2018 CLINICAL DATA:  Abdominal pain EXAM: CT ABDOMEN AND PELVIS WITH CONTRAST TECHNIQUE: Multidetector CT imaging of the abdomen and pelvis was performed using the standard protocol following bolus administration of intravenous contrast. CONTRAST:  126mL OMNIPAQUE IOHEXOL 300 MG/ML  SOLN COMPARISON:  None. FINDINGS: Lower chest: No acute abnormality. Hepatobiliary: No focal liver abnormality is seen. No gallstones, gallbladder wall thickening, or biliary dilatation. Pancreas:  Unremarkable. No pancreatic ductal dilatation or surrounding inflammatory changes. Spleen: Normal in size without focal abnormality. Adrenals/Urinary Tract: Adrenal glands are unremarkable. Kidneys are normal, without renal calculi, focal lesion, or hydronephrosis. Bladder is unremarkable. Stomach/Bowel: Prior gastric banding in satisfactory position. Small hiatal hernia. No bowel dilatation to suggest obstruction. Dilated appendix measuring 14 mm with severe surrounding inflammatory changes most consistent with acute appendicitis. There are a few locules air outside of the appendix collectively measuring approximately 18 mm, but adjacent to the appendix most concerning for perforated acute appendicitis. No drainable fluid collection. No pneumatosis, pneumoperitoneum or portal venous gas. Vascular/Lymphatic: No significant vascular findings are present. No enlarged abdominal or pelvic lymph nodes. Reproductive: Uterus and bilateral adnexa are unremarkable. Dystrophic calcification in the uterine fundus likely reflecting a small degenerated fibroid. Other: No abdominal wall hernia or abnormality. No abdominopelvic ascites. Musculoskeletal: No acute osseous abnormality. No aggressive osseous lesion. Bilateral facet arthropathy at L4-5 and L5-S1. IMPRESSION: 1. Dilated appendix with severe periappendiceal inflammatory changes and a few locules of air outside of, but adjacent to, the appendix most concerning for acute perforated appendicitis. Perforation appears contained and there is no drainable fluid collection to suggest an abscess at this time. Electronically Signed   By: Kathreen Devoid   On: 07/09/2018 16:25    Anti-infectives: Anti-infectives (From admission, onward)   Start     Dose/Rate Route Frequency Ordered Stop   07/09/18 2015  piperacillin-tazobactam (ZOSYN) IVPB 3.375 g     3.375 g 12.5 mL/hr over 240 Minutes Intravenous Every 8 hours 07/09/18 2009     07/09/18 1645  cefTRIAXone (ROCEPHIN) 2 g in  sodium  chloride 0.9 % 100 mL IVPB     2 g 200 mL/hr over 30 Minutes Intravenous  Once 07/09/18 1644 07/09/18 1733   07/09/18 1645  metroNIDAZOLE (FLAGYL) IVPB 500 mg     500 mg 100 mL/hr over 60 Minutes Intravenous  Once 07/09/18 1644 07/09/18 1948      Assessment/Plan: [age] F with perforated appendicitis and phlegmon ARF  1.  Plan to con't non op tx.  Con't Zosyn  2.  IVF 3.  PT may req repeat CT scan next week to check for drainable fluid collection   LOS: 1 day    Axel Fillerrmando Dragon Thrush 07/10/2018

## 2018-07-11 MED ORDER — DOCUSATE SODIUM 100 MG PO CAPS
100.0000 mg | ORAL_CAPSULE | Freq: Two times a day (BID) | ORAL | Status: DC
  Administered 2018-07-11 – 2018-07-14 (×6): 100 mg via ORAL
  Filled 2018-07-11 (×10): qty 1

## 2018-07-11 MED ORDER — DIPHENHYDRAMINE HCL 12.5 MG/5ML PO ELIX
12.5000 mg | ORAL_SOLUTION | Freq: Three times a day (TID) | ORAL | Status: DC | PRN
  Administered 2018-07-11: 12.5 mg via ORAL
  Filled 2018-07-11: qty 10

## 2018-07-11 MED ORDER — SODIUM CHLORIDE 0.9% FLUSH
10.0000 mL | INTRAVENOUS | Status: DC | PRN
  Administered 2018-07-14 – 2018-07-15 (×2): 10 mL
  Filled 2018-07-11 (×2): qty 40

## 2018-07-11 NOTE — Progress Notes (Signed)
Notified MD on call for breathing treatment for patient. MD order is for respiratory  to assess and treat.

## 2018-07-11 NOTE — Progress Notes (Addendum)
Assessment & Plan: 25 F with perforated appendicitis and phlegmon ARF   Continue IV hydration, abx's  Allow clear liquid diet  Encouraged OOB, ambulation  Plan repeat CT early next week for possible IR drainage procedure  Check CBC, BMET in AM 6/21         Armandina Gemma, MD       [facility]       Office: [phone]   Chief Complaint: Perforated acute appendicitis  Subjective: Patient in bed, nurse in room.  Comfortable.  Denies nausea or emesis.  Objective: Vital signs in last 24 hours: Temp:  [97.7 F (36.5 C)-100.3 F (37.9 C)] 98.8 F (37.1 C) (06/20 0604) Pulse Rate:  [86-94] 92 (06/20 0604) Resp:  [17-18] 18 (06/20 0604) BP: (110-111)/(66-83) 110/66 (06/20 0604) SpO2:  [94 %-96 %] 94 % (06/20 0604) Last BM Date: 07/09/18  Intake/Output from previous day: 06/19 0701 - 06/20 0700 In: 2710.9 [P.O.:150; I.V.:2409.5; IV Piggyback:151.4] Out: 0  Intake/Output this shift: No intake/output data recorded.  Physical Exam: HEENT - sclerae clear, mucous membranes moist Neck - soft Chest - clear bilaterally Cor - RRR Abdomen - soft, obese; moderate tenderness RLQ without mass Ext - no edema, non-tender Neuro - alert & oriented, no focal deficits  Lab Results:  Recent Labs    07/09/18 1348 07/10/18 0257  WBC 14.9* 20.2*  HGB 14.0 12.8  HCT 43.4 39.4  PLT 178 156   BMET Recent Labs    07/09/18 1348 07/10/18 0257  NA 136 138  K 3.7 3.5  CL 102 106  CO2 23 23  GLUCOSE 172* 164*  BUN 13 17  CREATININE 1.13* 1.27*  CALCIUM 9.3 8.7*   PT/INR No results for input(s): LABPROT, INR in the last 72 hours. Comprehensive Metabolic Panel:    Component Value Date/Time   NA 138 07/10/2018 0257   NA 136 07/09/2018 1348   K 3.5 07/10/2018 0257   K 3.7 07/09/2018 1348   CL 106 07/10/2018 0257   CL 102 07/09/2018 1348   CO2 23 07/10/2018 0257   CO2 23 07/09/2018 1348   BUN 17 07/10/2018 0257   BUN 13 07/09/2018 1348   CREATININE  1.27 (H) 07/10/2018 0257   CREATININE 1.13 (H) 07/09/2018 1348   GLUCOSE 164 (H) 07/10/2018 0257   GLUCOSE 172 (H) 07/09/2018 1348   CALCIUM 8.7 (L) 07/10/2018 0257   CALCIUM 9.3 07/09/2018 1348   AST 21 07/10/2018 0257   AST 19 07/09/2018 1348   ALT 15 07/10/2018 0257   ALT 19 07/09/2018 1348   ALKPHOS 73 07/10/2018 0257   ALKPHOS 77 07/09/2018 1348   BILITOT 1.9 (H) 07/10/2018 0257   BILITOT 1.6 (H) 07/09/2018 1348   PROT 6.6 07/10/2018 0257   PROT 7.3 07/09/2018 1348   ALBUMIN 3.1 (L) 07/10/2018 0257   ALBUMIN 3.7 07/09/2018 1348    Studies/Results: Ct Abdomen Pelvis W Contrast  Result Date: 07/09/2018 CLINICAL DATA:  Abdominal pain EXAM: CT ABDOMEN AND PELVIS WITH CONTRAST TECHNIQUE: Multidetector CT imaging of the abdomen and pelvis was performed using the standard protocol following bolus administration of intravenous contrast. CONTRAST:  162mL OMNIPAQUE IOHEXOL 300 MG/ML  SOLN COMPARISON:  None. FINDINGS: Lower chest: No acute abnormality. Hepatobiliary: No focal liver abnormality is seen. No gallstones, gallbladder wall thickening, or biliary dilatation. Pancreas: Unremarkable. No pancreatic ductal dilatation or surrounding inflammatory changes. Spleen: Normal in size without focal abnormality. Adrenals/Urinary Tract: Adrenal glands are unremarkable. Kidneys are normal, without  renal calculi, focal lesion, or hydronephrosis. Bladder is unremarkable. Stomach/Bowel: Prior gastric banding in satisfactory position. Small hiatal hernia. No bowel dilatation to suggest obstruction. Dilated appendix measuring 14 mm with severe surrounding inflammatory changes most consistent with acute appendicitis. There are a few locules air outside of the appendix collectively measuring approximately 18 mm, but adjacent to the appendix most concerning for perforated acute appendicitis. No drainable fluid collection. No pneumatosis, pneumoperitoneum or portal venous gas. Vascular/Lymphatic: No significant  vascular findings are present. No enlarged abdominal or pelvic lymph nodes. Reproductive: Uterus and bilateral adnexa are unremarkable. Dystrophic calcification in the uterine fundus likely reflecting a small degenerated fibroid. Other: No abdominal wall hernia or abnormality. No abdominopelvic ascites. Musculoskeletal: No acute osseous abnormality. No aggressive osseous lesion. Bilateral facet arthropathy at L4-5 and L5-S1. IMPRESSION: 1. Dilated appendix with severe periappendiceal inflammatory changes and a few locules of air outside of, but adjacent to, the appendix most concerning for acute perforated appendicitis. Perforation appears contained and there is no drainable fluid collection to suggest an abscess at this time. Electronically Signed   By: Elige KoHetal  Patel   On: 07/09/2018 16:25      Darnell Levelodd Nekeya Briski 07/11/2018  Patient ID: Melodye PedMargie Nigg, female   DOB: [birth year], 68 y.o.   MRN: [id number]

## 2018-07-11 NOTE — Progress Notes (Signed)
RT called to assess patient. Patient's breath sounds were clear and diminished in the bases. Spo2 96% on room air. Patient does not have history of respiratory issues or smoking history. RT did not give breathing treatment at this time. RN is about to give patient something for pain. RT informed RN to call if patient's WOB increases.

## 2018-07-12 LAB — BASIC METABOLIC PANEL
Anion gap: 7 (ref 5–15)
BUN: 13 mg/dL (ref 8–23)
CO2: 24 mmol/L (ref 22–32)
Calcium: 8.5 mg/dL — ABNORMAL LOW (ref 8.9–10.3)
Chloride: 110 mmol/L (ref 98–111)
Creatinine, Ser: 0.84 mg/dL (ref 0.44–1.00)
GFR calc Af Amer: >60 mL/min (ref >60)
GFR calc non Af Amer: >60 mL/min (ref >60)
Glucose, Bld: 135 mg/dL — ABNORMAL HIGH (ref 70–99)
Potassium: 3.4 mmol/L — ABNORMAL LOW (ref 3.5–5.1)
Sodium: 141 mmol/L (ref 135–145)

## 2018-07-12 LAB — CBC
HCT: 36.2 % (ref 36.0–46.0)
Hemoglobin: 11.8 g/dL — ABNORMAL LOW (ref 12.0–15.0)
MCH: 27.3 pg (ref 26.0–34.0)
MCHC: 32.6 g/dL (ref 30.0–36.0)
MCV: 83.8 fL (ref 80.0–100.0)
Platelets: 151 10*3/uL (ref 150–400)
RBC: 4.32 MIL/uL (ref 3.87–5.11)
RDW: 14.3 % (ref 11.5–15.5)
WBC: 16.7 10*3/uL — ABNORMAL HIGH (ref 4.0–10.5)
nRBC: 0 % (ref 0.0–0.2)

## 2018-07-12 NOTE — Progress Notes (Signed)
     Assessment & Plan: perforated appendicitis with phlegmon  Continue IV Zosyn             Continue IV hydration, clear liquid diet - creatinine improved             Continue clear liquid diet - patient does not wish to advance today             Encouraged OOB, ambulation - ambulating in halls with walker  WBC mildly improved today - 16.7             Plan repeat CT early next week for possible IR drainage procedure        Armandina Gemma, MD       [facility]       Office: [phone]   Chief Complaint: Perforated appendicitis with phlegmon  Subjective: Patient ambulating in hall with nursing, some pain RLQ.  Tolerating clear liquids.  Objective: Vital signs in last 24 hours: Temp:  [99.1 F (37.3 C)-100.6 F (38.1 C)] 100.6 F (38.1 C) (06/21 0413) Pulse Rate:  [90-97] 97 (06/21 0413) Resp:  [19-20] 19 (06/21 0413) BP: (131-145)/(73-75) 145/73 (06/21 0413) SpO2:  [97 %] 97 % (06/21 0413) Last BM Date: 07/07/18  Intake/Output from previous day: 06/20 0701 - 06/21 0700 In: 2349.9 [I.V.:2195.3; IV Piggyback:154.7] Out: -  Intake/Output this shift: No intake/output data recorded.  Physical Exam: HEENT - sclerae clear, mucous membranes moist Neck - soft Chest - clear bilaterally Cor - RRR Abdomen - soft, obese; tender RLQ Neuro - alert & oriented, no focal deficits  Lab Results:  Recent Labs    07/10/18 0257 07/12/18 0402  WBC 20.2* 16.7*  HGB 12.8 11.8*  HCT 39.4 36.2  PLT 156 151   BMET Recent Labs    07/10/18 0257 07/12/18 0402  NA 138 141  K 3.5 3.4*  CL 106 110  CO2 23 24  GLUCOSE 164* 135*  BUN 17 13  CREATININE 1.27* 0.84  CALCIUM 8.7* 8.5*   PT/INR No results for input(s): LABPROT, INR in the last 72 hours. Comprehensive Metabolic Panel:    Component Value Date/Time   NA 141 07/12/2018 0402   NA 138 07/10/2018 0257   K 3.4 (L) 07/12/2018 0402   K 3.5 07/10/2018 0257   CL 110 07/12/2018 0402   CL 106 07/10/2018  0257   CO2 24 07/12/2018 0402   CO2 23 07/10/2018 0257   BUN 13 07/12/2018 0402   BUN 17 07/10/2018 0257   CREATININE 0.84 07/12/2018 0402   CREATININE 1.27 (H) 07/10/2018 0257   GLUCOSE 135 (H) 07/12/2018 0402   GLUCOSE 164 (H) 07/10/2018 0257   CALCIUM 8.5 (L) 07/12/2018 0402   CALCIUM 8.7 (L) 07/10/2018 0257   AST 21 07/10/2018 0257   AST 19 07/09/2018 1348   ALT 15 07/10/2018 0257   ALT 19 07/09/2018 1348   ALKPHOS 73 07/10/2018 0257   ALKPHOS 77 07/09/2018 1348   BILITOT 1.9 (H) 07/10/2018 0257   BILITOT 1.6 (H) 07/09/2018 1348   PROT 6.6 07/10/2018 0257   PROT 7.3 07/09/2018 1348   ALBUMIN 3.1 (L) 07/10/2018 0257   ALBUMIN 3.7 07/09/2018 1348    Studies/Results: No results found.    Armandina Gemma 07/12/2018  Patient ID: Julie Richards, female   DOB: [birth year], 68 y.o.   MRN: [id number]

## 2018-07-13 ENCOUNTER — Inpatient Hospital Stay (HOSPITAL_COMMUNITY): Payer: Medicare Other

## 2018-07-13 ENCOUNTER — Other Ambulatory Visit (HOSPITAL_COMMUNITY): Payer: Medicare Other

## 2018-07-13 MED ORDER — POTASSIUM CHLORIDE CRYS ER 20 MEQ PO TBCR
20.0000 meq | EXTENDED_RELEASE_TABLET | Freq: Two times a day (BID) | ORAL | Status: AC
  Administered 2018-07-13 – 2018-07-14 (×4): 20 meq via ORAL
  Filled 2018-07-13 (×4): qty 1

## 2018-07-13 MED ORDER — IOHEXOL 300 MG/ML  SOLN
100.0000 mL | Freq: Once | INTRAMUSCULAR | Status: AC | PRN
  Administered 2018-07-13: 100 mL via INTRAVENOUS

## 2018-07-13 MED ORDER — KCL IN DEXTROSE-NACL 20-5-0.9 MEQ/L-%-% IV SOLN
INTRAVENOUS | Status: DC
  Administered 2018-07-13: 13:00:00 via INTRAVENOUS
  Filled 2018-07-13 (×2): qty 1000

## 2018-07-13 NOTE — Progress Notes (Signed)
[facility] Progress Note     Subjective: CC: RLQ pain Patient feels overall pain is improved but still occasionally has some cramping intermittent pain in RLQ. Denies nausea or vomiting. Tolerating CLD and having bowel function.   Objective: Vital signs in last 24 hours: Temp:  [98.2 F (36.8 C)-98.6 F (37 C)] 98.5 F (36.9 C) (06/22 0521) Pulse Rate:  [83-94] 94 (06/22 0521) Resp:  [17-20] 20 (06/22 0521) BP: (115-134)/(73-86) 123/73 (06/22 0521) SpO2:  [97 %-99 %] 97 % (06/22 0521) Last BM Date: 07/12/18  Intake/Output from previous day: 06/21 0701 - 06/22 0700 In: 3177.9 [P.O.:300; I.V.:2683.9; IV Piggyback:194] Out: -  Intake/Output this shift: No intake/output data recorded.  PE: Gen:  Alert, NAD, pleasant Card:  Regular rate and rhythm Pulm:  Normal effort, clear to auscultation bilaterally Abd: Soft, mildly TTP in RLQ, non-distended, +BS, no HSM Skin: warm and dry, no rashes  Psych: A&Ox3   Lab Results:  Recent Labs    07/12/18 0402  WBC 16.7*  HGB 11.8*  HCT 36.2  PLT 151   BMET Recent Labs    07/12/18 0402  NA 141  K 3.4*  CL 110  CO2 24  GLUCOSE 135*  BUN 13  CREATININE 0.84  CALCIUM 8.5*   PT/INR No results for input(s): LABPROT, INR in the last 72 hours. CMP     Component Value Date/Time   NA 141 07/12/2018 0402   K 3.4 (L) 07/12/2018 0402   CL 110 07/12/2018 0402   CO2 24 07/12/2018 0402   GLUCOSE 135 (H) 07/12/2018 0402   BUN 13 07/12/2018 0402   CREATININE 0.84 07/12/2018 0402   CALCIUM 8.5 (L) 07/12/2018 0402   PROT 6.6 07/10/2018 0257   ALBUMIN 3.1 (L) 07/10/2018 0257   AST 21 07/10/2018 0257   ALT 15 07/10/2018 0257   ALKPHOS 73 07/10/2018 0257   BILITOT 1.9 (H) 07/10/2018 0257   GFRNONAA >60 07/12/2018 0402   GFRAA >60 07/12/2018 0402   Lipase     Component Value Date/Time   LIPASE 24 07/09/2018 1348       Studies/Results: No results found.  Anti-infectives: Anti-infectives (From admission,  onward)   Start     Dose/Rate Route Frequency Ordered Stop   07/09/18 2015  piperacillin-tazobactam (ZOSYN) IVPB 3.375 g     3.375 g 12.5 mL/hr over 240 Minutes Intravenous Every 8 hours 07/09/18 2009     07/09/18 1645  cefTRIAXone (ROCEPHIN) 2 g in sodium chloride 0.9 % 100 mL IVPB     2 g 200 mL/hr over 30 Minutes Intravenous  Once 07/09/18 1644 07/09/18 1733   07/09/18 1645  metroNIDAZOLE (FLAGYL) IVPB 500 mg     500 mg 100 mL/hr over 60 Minutes Intravenous  Once 07/09/18 1644 07/09/18 1948       Assessment/Plan  Perforated appendicitis without abscess - tolerating CLD and having bowel function - advance to FLD - WBC trending down yesterday, 16 from 20 , afebrile - repeat tomorrow - CT today to re-evaluate for drainable collection, if present will consult IR - continue IV abx - continue to mobilize  FEN: FLD, decreased IVF VTE: SCDs ID: zosyn 6/18>>  LOS: 4 days    Brigid Re , [facility] 07/13/2018, 9:51 AM Pager: [place]: [phone]

## 2018-07-13 NOTE — Plan of Care (Signed)
  Problem: Health Behavior/Discharge Planning: Goal: Ability to manage health-related needs will improve Outcome: Progressing   Problem: Activity: Goal: Risk for activity intolerance will decrease Outcome: Progressing   Problem: Nutrition: Goal: Adequate nutrition will be maintained Outcome: Progressing   

## 2018-07-13 NOTE — Plan of Care (Signed)
  Problem: Clinical Measurements: Goal: Ability to maintain clinical measurements within normal limits will improve Outcome: Progressing   Problem: Coping: Goal: Level of anxiety will decrease Outcome: Progressing   Problem: Pain Managment: Goal: General experience of comfort will improve Outcome: Progressing   Problem: Safety: Goal: Ability to remain free from injury will improve Outcome: Progressing   

## 2018-07-14 LAB — CBC
HCT: 35.1 % — ABNORMAL LOW (ref 36.0–46.0)
Hemoglobin: 11.2 g/dL — ABNORMAL LOW (ref 12.0–15.0)
MCH: 26.7 pg (ref 26.0–34.0)
MCHC: 31.9 g/dL (ref 30.0–36.0)
MCV: 83.8 fL (ref 80.0–100.0)
Platelets: 194 10*3/uL (ref 150–400)
RBC: 4.19 MIL/uL (ref 3.87–5.11)
RDW: 14.2 % (ref 11.5–15.5)
WBC: 14 10*3/uL — ABNORMAL HIGH (ref 4.0–10.5)
nRBC: 0 % (ref 0.0–0.2)

## 2018-07-14 LAB — BASIC METABOLIC PANEL
Anion gap: 9 (ref 5–15)
BUN: 6 mg/dL — ABNORMAL LOW (ref 8–23)
CO2: 22 mmol/L (ref 22–32)
Calcium: 8.4 mg/dL — ABNORMAL LOW (ref 8.9–10.3)
Chloride: 107 mmol/L (ref 98–111)
Creatinine, Ser: 0.79 mg/dL (ref 0.44–1.00)
GFR calc Af Amer: >60 mL/min (ref >60)
GFR calc non Af Amer: >60 mL/min (ref >60)
Glucose, Bld: 106 mg/dL — ABNORMAL HIGH (ref 70–99)
Potassium: 3.5 mmol/L (ref 3.5–5.1)
Sodium: 138 mmol/L (ref 135–145)

## 2018-07-14 MED ORDER — HYDROMORPHONE HCL 1 MG/ML IJ SOLN
1.0000 mg | INTRAMUSCULAR | Status: DC | PRN
  Administered 2018-07-14: 1 mg via INTRAVENOUS
  Filled 2018-07-14: qty 1

## 2018-07-14 MED ORDER — AMOXICILLIN-POT CLAVULANATE 875-125 MG PO TABS
1.0000 | ORAL_TABLET | Freq: Two times a day (BID) | ORAL | Status: DC
  Administered 2018-07-14 – 2018-07-16 (×5): 1 via ORAL
  Filled 2018-07-14 (×5): qty 1

## 2018-07-14 MED ORDER — TRAMADOL HCL 50 MG PO TABS
50.0000 mg | ORAL_TABLET | Freq: Four times a day (QID) | ORAL | Status: DC | PRN
  Administered 2018-07-14 – 2018-07-15 (×3): 50 mg via ORAL
  Filled 2018-07-14 (×3): qty 1

## 2018-07-14 NOTE — Progress Notes (Signed)
[facility] Progress Note     Subjective: CC: pain Patient still having pain in RLQ that she describes as feeling like labor pain. Denies nausea or vomiting, tolerated FLD. Having bowel function.   Objective: Vital signs in last 24 hours: Temp:  [98.2 F (36.8 C)-98.9 F (37.2 C)] 98.6 F (37 C) (06/23 0533) Pulse Rate:  [86-97] 97 (06/23 0533) Resp:  [18-20] 20 (06/23 0533) BP: (117-136)/(70-86) 117/70 (06/23 0533) SpO2:  [91 %-97 %] 96 % (06/23 0533) Last BM Date: 07/13/18  Intake/Output from previous day: 06/22 0701 - 06/23 0700 In: 768.3 [P.O.:480; I.V.:238.3; IV Piggyback:50] Out: -  Intake/Output this shift: No intake/output data recorded.  PE: Gen:  Alert, NAD, pleasant Card:  Regular rate and rhythm Pulm:  Normal effort, clear to auscultation bilaterally Abd: Soft, mildly TTP in RLQ, non-distended, +BS, no HSM Skin: warm and dry, no rashes  Psych: A&Ox3   Lab Results:  Recent Labs    07/12/18 0402 07/14/18 0648  WBC 16.7* 14.0*  HGB 11.8* 11.2*  HCT 36.2 35.1*  PLT 151 194   BMET Recent Labs    07/12/18 0402 07/14/18 0338  NA 141 138  K 3.4* 3.5  CL 110 107  CO2 24 22  GLUCOSE 135* 106*  BUN 13 6*  CREATININE 0.84 0.79  CALCIUM 8.5* 8.4*   PT/INR No results for input(s): LABPROT, INR in the last 72 hours. CMP     Component Value Date/Time   NA 138 07/14/2018 0338   K 3.5 07/14/2018 0338   CL 107 07/14/2018 0338   CO2 22 07/14/2018 0338   GLUCOSE 106 (H) 07/14/2018 0338   BUN 6 (L) 07/14/2018 0338   CREATININE 0.79 07/14/2018 0338   CALCIUM 8.4 (L) 07/14/2018 0338   PROT 6.6 07/10/2018 0257   ALBUMIN 3.1 (L) 07/10/2018 0257   AST 21 07/10/2018 0257   ALT 15 07/10/2018 0257   ALKPHOS 73 07/10/2018 0257   BILITOT 1.9 (H) 07/10/2018 0257   GFRNONAA >60 07/14/2018 0338   GFRAA >60 07/14/2018 0338   Lipase     Component Value Date/Time   LIPASE 24 07/09/2018 1348       Studies/Results: Ct Abdomen Pelvis W  Contrast  Result Date: 07/13/2018 CLINICAL DATA:  Perforated appendicitis.  Re-evaluate for abscess. EXAM: CT ABDOMEN AND PELVIS WITH CONTRAST TECHNIQUE: Multidetector CT imaging of the abdomen and pelvis was performed using the standard protocol following bolus administration of intravenous contrast. CONTRAST:  100mL OMNIPAQUE IOHEXOL 300 MG/ML  SOLN COMPARISON:  CT abdomen pelvis dated July 09, 2018. FINDINGS: Lower chest: Minimal bilateral lower lobe subsegmental atelectasis. Hepatobiliary: Unchanged small simple cyst in the right hepatic lobe. No new focal liver abnormality. The gallbladder is unremarkable. No biliary dilatation. Pancreas: Unremarkable. No pancreatic ductal dilatation or surrounding inflammatory changes. Spleen: Normal in size without focal abnormality. Adrenals/Urinary Tract: The adrenal glands and left kidney are unremarkable. Unchanged nonobstructive calculi in the upper pole of the right kidney measuring up to 6 mm. No hydronephrosis. The bladder is under distended. Stomach/Bowel: Perforated appendicitis again identified with appendicolith at the base of the appendix. Inflammatory changes around the appendix have increased. The amount of extraluminal air adjacent to the appendix is slightly increased in size, now measuring 2.7 x 2.5 x 3.4 cm, previously 2.0 x 0.8 x 1.8 cm. 1.8 x 1.5 cm gas and fluid collection anterior to the cecum has slightly increased in size, previously 1.2 x 0.5 cm. Increased wall thickening of the cecum. Prior  gastric banding with unchanged small hiatal hernia. No obstruction. Vascular/Lymphatic: No significant vascular findings are present. No enlarged abdominal or pelvic lymph nodes. Reproductive: Unchanged small uterine fibroids.  No adnexal mass. Other: No abdominal wall hernia or abnormality. No abdominopelvic ascites. Musculoskeletal: No acute or significant osseous findings. IMPRESSION: 1. Perforated appendicitis again seen with interval increase in the  loculated extraluminal air adjacent to the appendix, now measuring 2.7 x 2.5 x 3.4 cm, previously 2.0 x 0.8 x 1.8 cm. Similarly, there is a small 1.8 x 1.5 cm gas and fluid collection anterior to the cecum that previously measured 1.2 x 0.5 cm, concerning for developing abscess. These both likely remain too small for percutaneous drainage. 2. Unchanged nonobstructive right nephrolithiasis. Electronically Signed   By: Titus Dubin M.D.   On: 07/13/2018 16:45    Anti-infectives: Anti-infectives (From admission, onward)   Start     Dose/Rate Route Frequency Ordered Stop   07/14/18 1000  amoxicillin-clavulanate (AUGMENTIN) 875-125 MG per tablet 1 tablet     1 tablet Oral Every 12 hours 07/14/18 0827     07/09/18 2015  piperacillin-tazobactam (ZOSYN) IVPB 3.375 g  Status:  Discontinued     3.375 g 12.5 mL/hr over 240 Minutes Intravenous Every 8 hours 07/09/18 2009 07/14/18 0827   07/09/18 1645  cefTRIAXone (ROCEPHIN) 2 g in sodium chloride 0.9 % 100 mL IVPB     2 g 200 mL/hr over 30 Minutes Intravenous  Once 07/09/18 1644 07/09/18 1733   07/09/18 1645  metroNIDAZOLE (FLAGYL) IVPB 500 mg     500 mg 100 mL/hr over 60 Minutes Intravenous  Once 07/09/18 1644 07/09/18 1948       Assessment/Plan Perforated appendicitis without abscess - tolerating FLD and having bowel function - advance to soft diet - WBC trending down, 14K today - will transition to PO abx - CT yesterday showed 2 collections still too small to drain - continue to mobilize - transition to PO pain control today   FEN: soft diet, SLIV VTE: SCDs, lovenox ID: zosyn 6/18>6/23; PO augmentin 6/23>>  LOS: 5 days    Brigid Re , [facility] 07/14/2018, 8:27 AM Pager: [place]: [phone]

## 2018-07-15 LAB — CBC
HCT: 33.9 % — ABNORMAL LOW (ref 36.0–46.0)
Hemoglobin: 11 g/dL — ABNORMAL LOW (ref 12.0–15.0)
MCH: 26.8 pg (ref 26.0–34.0)
MCHC: 32.4 g/dL (ref 30.0–36.0)
MCV: 82.7 fL (ref 80.0–100.0)
Platelets: 208 10*3/uL (ref 150–400)
RBC: 4.1 MIL/uL (ref 3.87–5.11)
RDW: 14.1 % (ref 11.5–15.5)
WBC: 14.6 10*3/uL — ABNORMAL HIGH (ref 4.0–10.5)
nRBC: 0 % (ref 0.0–0.2)

## 2018-07-15 MED ORDER — IBUPROFEN 400 MG PO TABS
400.0000 mg | ORAL_TABLET | Freq: Four times a day (QID) | ORAL | Status: DC
  Administered 2018-07-15 – 2018-07-16 (×5): 400 mg via ORAL
  Filled 2018-07-15 (×5): qty 1

## 2018-07-15 MED ORDER — ACETAMINOPHEN 325 MG PO TABS
650.0000 mg | ORAL_TABLET | Freq: Four times a day (QID) | ORAL | Status: DC
  Administered 2018-07-15 – 2018-07-16 (×5): 650 mg via ORAL
  Filled 2018-07-15 (×5): qty 2

## 2018-07-15 MED ORDER — PANTOPRAZOLE SODIUM 40 MG PO TBEC
40.0000 mg | DELAYED_RELEASE_TABLET | Freq: Every day | ORAL | Status: DC
  Administered 2018-07-15 – 2018-07-16 (×2): 40 mg via ORAL
  Filled 2018-07-15 (×2): qty 1

## 2018-07-15 NOTE — Care Management Important Message (Signed)
Important Message  Patient Details  Name: Julie Richards MRN: [id number] Date of Birth: 02-Aug-1950   Medicare Important Message Given:  Yes     Memory Argue 07/15/2018, 2:08 PM

## 2018-07-15 NOTE — Progress Notes (Signed)
[facility] Progress Note     Subjective: CC: pain Patient still having pain in RLQ, describes like a stitch this AM. Did not take any IV pain medication after yesterday AM. Tolerated diet, having bowel function.   Objective: Vital signs in last 24 hours: Temp:  [98 F (36.7 C)-98.8 F (37.1 C)] 98.4 F (36.9 C) (06/24 0528) Pulse Rate:  [82-92] 82 (06/24 0528) Resp:  [17] 17 (06/23 1729) BP: (130-145)/(78-85) 140/78 (06/24 0528) SpO2:  [97 %-100 %] 97 % (06/24 0528) Last BM Date: 07/14/18  Intake/Output from previous day: 06/23 0701 - 06/24 0700 In: 10 [I.V.:10] Out: -  Intake/Output this shift: No intake/output data recorded.  PE: Gen: Alert, NAD, pleasant Card: Regular rate and rhythm Pulm: Normal effort, clear to auscultation bilaterally Abd: Soft,mildly TTP in RLQ, non-distended,+BS, no HSM Skin: warm and dry, no rashes  Psych: A&Ox3   Lab Results:  Recent Labs    07/14/18 0648 07/15/18 0244  WBC 14.0* 14.6*  HGB 11.2* 11.0*  HCT 35.1* 33.9*  PLT 194 208   BMET Recent Labs    07/14/18 0338  NA 138  K 3.5  CL 107  CO2 22  GLUCOSE 106*  BUN 6*  CREATININE 0.79  CALCIUM 8.4*   PT/INR No results for input(s): LABPROT, INR in the last 72 hours. CMP     Component Value Date/Time   NA 138 07/14/2018 0338   K 3.5 07/14/2018 0338   CL 107 07/14/2018 0338   CO2 22 07/14/2018 0338   GLUCOSE 106 (H) 07/14/2018 0338   BUN 6 (L) 07/14/2018 0338   CREATININE 0.79 07/14/2018 0338   CALCIUM 8.4 (L) 07/14/2018 0338   PROT 6.6 07/10/2018 0257   ALBUMIN 3.1 (L) 07/10/2018 0257   AST 21 07/10/2018 0257   ALT 15 07/10/2018 0257   ALKPHOS 73 07/10/2018 0257   BILITOT 1.9 (H) 07/10/2018 0257   GFRNONAA >60 07/14/2018 0338   GFRAA >60 07/14/2018 0338   Lipase     Component Value Date/Time   LIPASE 24 07/09/2018 1348       Studies/Results: Ct Abdomen Pelvis W Contrast  Result Date: 07/13/2018 CLINICAL DATA:  Perforated  appendicitis.  Re-evaluate for abscess. EXAM: CT ABDOMEN AND PELVIS WITH CONTRAST TECHNIQUE: Multidetector CT imaging of the abdomen and pelvis was performed using the standard protocol following bolus administration of intravenous contrast. CONTRAST:  128mL OMNIPAQUE IOHEXOL 300 MG/ML  SOLN COMPARISON:  CT abdomen pelvis dated July 09, 2018. FINDINGS: Lower chest: Minimal bilateral lower lobe subsegmental atelectasis. Hepatobiliary: Unchanged small simple cyst in the right hepatic lobe. No new focal liver abnormality. The gallbladder is unremarkable. No biliary dilatation. Pancreas: Unremarkable. No pancreatic ductal dilatation or surrounding inflammatory changes. Spleen: Normal in size without focal abnormality. Adrenals/Urinary Tract: The adrenal glands and left kidney are unremarkable. Unchanged nonobstructive calculi in the upper pole of the right kidney measuring up to 6 mm. No hydronephrosis. The bladder is under distended. Stomach/Bowel: Perforated appendicitis again identified with appendicolith at the base of the appendix. Inflammatory changes around the appendix have increased. The amount of extraluminal air adjacent to the appendix is slightly increased in size, now measuring 2.7 x 2.5 x 3.4 cm, previously 2.0 x 0.8 x 1.8 cm. 1.8 x 1.5 cm gas and fluid collection anterior to the cecum has slightly increased in size, previously 1.2 x 0.5 cm. Increased wall thickening of the cecum. Prior gastric banding with unchanged small hiatal hernia. No obstruction. Vascular/Lymphatic: No significant vascular findings  are present. No enlarged abdominal or pelvic lymph nodes. Reproductive: Unchanged small uterine fibroids.  No adnexal mass. Other: No abdominal wall hernia or abnormality. No abdominopelvic ascites. Musculoskeletal: No acute or significant osseous findings. IMPRESSION: 1. Perforated appendicitis again seen with interval increase in the loculated extraluminal air adjacent to the appendix, now measuring  2.7 x 2.5 x 3.4 cm, previously 2.0 x 0.8 x 1.8 cm. Similarly, there is a small 1.8 x 1.5 cm gas and fluid collection anterior to the cecum that previously measured 1.2 x 0.5 cm, concerning for developing abscess. These both likely remain too small for percutaneous drainage. 2. Unchanged nonobstructive right nephrolithiasis. Electronically Signed   By: Obie DredgeWilliam T Derry M.D.   On: 07/13/2018 16:45    Anti-infectives: Anti-infectives (From admission, onward)   Start     Dose/Rate Route Frequency Ordered Stop   07/14/18 1200  amoxicillin-clavulanate (AUGMENTIN) 875-125 MG per tablet 1 tablet     1 tablet Oral Every 12 hours 07/14/18 0827     07/09/18 2015  piperacillin-tazobactam (ZOSYN) IVPB 3.375 g  Status:  Discontinued     3.375 g 12.5 mL/hr over 240 Minutes Intravenous Every 8 hours 07/09/18 2009 07/14/18 0827   07/09/18 1645  cefTRIAXone (ROCEPHIN) 2 g in sodium chloride 0.9 % 100 mL IVPB     2 g 200 mL/hr over 30 Minutes Intravenous  Once 07/09/18 1644 07/09/18 1733   07/09/18 1645  metroNIDAZOLE (FLAGYL) IVPB 500 mg     500 mg 100 mL/hr over 60 Minutes Intravenous  Once 07/09/18 1644 07/09/18 1948       Assessment/Plan Perforated appendicitis without abscess - tolerating soft and having bowel function - WBC stable on PO abx, afebrile - CT 6/22 showed 2 collections still too small to drain - continue to mobilize  - continue to work on PO pain control - scheduled tylenol and ibuprofen  FEN: soft diet, SLIV VTE: SCDs, lovenox ID: zosyn 6/18>6/23; PO augmentin 6/23>>  LOS: 6 days    Wells GuilesKelly Rayburn , [facility] 07/15/2018, 8:15 AM Pager: [phone] Consults: [phone]

## 2018-07-16 ENCOUNTER — Encounter (HOSPITAL_BASED_OUTPATIENT_CLINIC_OR_DEPARTMENT_OTHER): Payer: Medicare Other

## 2018-07-16 LAB — CBC
HCT: 32.8 % — ABNORMAL LOW (ref 36.0–46.0)
Hemoglobin: 10.7 g/dL — ABNORMAL LOW (ref 12.0–15.0)
MCH: 27 pg (ref 26.0–34.0)
MCHC: 32.6 g/dL (ref 30.0–36.0)
MCV: 82.8 fL (ref 80.0–100.0)
Platelets: 229 10*3/uL (ref 150–400)
RBC: 3.96 MIL/uL (ref 3.87–5.11)
RDW: 14 % (ref 11.5–15.5)
WBC: 11.3 10*3/uL — ABNORMAL HIGH (ref 4.0–10.5)
nRBC: 0 % (ref 0.0–0.2)

## 2018-07-16 MED ORDER — ACETAMINOPHEN 325 MG PO TABS: 650.0000 mg | ORAL_TABLET | Freq: Four times a day (QID) | ORAL | Status: DC | PRN

## 2018-07-16 MED ORDER — AMOXICILLIN-POT CLAVULANATE 875-125 MG PO TABS: 1.0000 | ORAL_TABLET | Freq: Two times a day (BID) | ORAL | 0 refills | Status: DC

## 2018-07-16 MED ORDER — TRAMADOL HCL 50 MG PO TABS: 50.0000 mg | ORAL_TABLET | Freq: Four times a day (QID) | ORAL | 1 refills | Status: DC | PRN

## 2018-07-16 MED ORDER — IBUPROFEN 400 MG PO TABS: 400.0000 mg | ORAL_TABLET | Freq: Four times a day (QID) | ORAL | 0 refills | Status: DC | PRN

## 2018-07-16 NOTE — Plan of Care (Signed)

## 2018-07-16 NOTE — Discharge Summary (Signed)
[facility] Discharge Summary   Patient ID: Julie Richards MRN: [id number] DOB/AGE: 10-05-1950 69 y.o.  Admit date: 07/09/2018 Discharge date: 07/16/2018  Admitting Diagnosis: Perforated appendicitis  Discharge Diagnosis Patient Active Problem List   Diagnosis Date Noted  . Perforated appendicitis 07/09/2018    Consultants None  Imaging: No results found.  Procedures None  Hospital Course:  Patient is a 68 year old female who presented to [facility] with abdominal pain.  Workup showed acute perforated appendicitis without drainable collections.  Patient was admitted and treated conservatively with bowel rest and antibiotics. Diet was advanced as tolerated. Follow up CT 6/22 showed 2 small collections that remained too small to drain. Patient continued on conservative management and pain and bowel function continued to improve. Transitioned to PO antibiotics 6/23 and tolerated well.   On 07/16/18, the patient was voiding well, tolerating diet, ambulating well, pain well controlled, vital signs stable and felt stable for discharge home.  Patient will follow up in our office in 2 weeks and knows to call with questions or concerns. She will call to confirm appointment date/time.    Physical Exam: Gen: Alert, NAD, pleasant Card: Regular rate and rhythm Pulm: Normal effort, clear to auscultation bilaterally Abd: Soft,mildly TTP in RLQ, non-distended,+BS, no HSM Skin: warm and dry, no rashes  Psych: A&Ox3  I have personally looked this patient up in the Controlled Substance Database and reviewed their medications.   Allergies as of 07/16/2018      Reactions   Percocet [oxycodone-acetaminophen] Nausea And Vomiting      Medication List    TAKE these medications   acetaminophen 325 MG tablet Commonly known as: TYLENOL Take 2 tablets (650 mg total) by mouth every 6 (six) hours as needed for mild pain.   amoxicillin-clavulanate 875-125 MG tablet Commonly known  as: AUGMENTIN Take 1 tablet by mouth every 12 (twelve) hours for 7 doses.   ibuprofen 400 MG tablet Commonly known as: ADVIL Take 1 tablet (400 mg total) by mouth every 6 (six) hours as needed for moderate pain.   multivitamin with minerals Tabs tablet Take 1 tablet by mouth daily.   OVER THE COUNTER MEDICATION Take 1 tablet by mouth daily. Mega 4   traMADol 50 MG tablet Commonly known as: ULTRAM Take 1-2 tablets (50-100 mg total) by mouth every 6 (six) hours as needed for moderate pain or severe pain.   Vitamin D3 1.25 MG (50000 UT) Caps Take 1 capsule by mouth daily.        Follow-up Information    Erroll Luna, MD. Go on 07/31/2018.   Specialty: General Surgery Why: Follow up appointment scheduled for 10:20 AM. Please arrive 30 min prior to appointment time for check in. Bring photo ID and insurance information. Our office is working on scheduling an outpatient CT for you prior to appointment.  Contact information: [address] [place] [address] [phone]           Signed: Brigid Re, [facility] 07/16/2018, 8:37 AM Pager: [phone] Consults: [phone]

## 2018-07-16 NOTE — Discharge Instructions (Signed)
When to call [facility] to be seen sooner: severe pain that does not get better with pain medication, nausea and vomiting, fever > 100.3  Appendicitis, Adult  Appendicitis is inflammation of the appendix. The appendix is a finger-shaped tube that is attached to the large intestine. If appendicitis is not treated, it can cause the appendix to tear (rupture). A ruptured appendix can lead to a life-threatening infection. It can also cause a painful collection of pus (abscess) to form in the appendix. What are the causes? This condition may be caused by a blockage in the appendix that leads to infection. The blockage can be caused by:  A ball of stool (feces).  Enlarged lymph glands. In some cases, the cause may not be known. What increases the risk? Age is a risk factor. You are more likely to develop this condition if you are between 4310 and 66 years of age. What are the signs or symptoms? Symptoms of this condition include:  Pain that starts around the belly button and moves toward the lower right part of the abdomen. The pain can become more severe as time passes. It gets worse with coughing or sudden movements.  Tenderness in the lower right abdomen.  Nausea.  Vomiting.  Loss of appetite.  Fever.  Difficulty passing stool (constipation).  Passing very loose stools (diarrhea).  Generally feeling unwell. How is this diagnosed? This condition may be diagnosed with:  A physical exam.  Blood tests.  Urine test. To confirm the diagnosis, an ultrasound, MRI, or CT scan may be done. How is this treated? This condition is usually treated with surgery to remove the appendix (appendectomy). There are two methods for doing an appendectomy:  Open appendectomy. In this surgery, the appendix is removed through a large incision that is made in the lower right abdomen. This procedure may be recommended if: ? You have major scarring from a previous surgery. ? You have a  bleeding disorder. ? You are pregnant and are about to give birth. ? You have a condition that makes it hard to do surgery through small incisions (laparoscopic procedure). This includes severe infection or a ruptured appendix.  Laparoscopic appendectomy. In this surgery, the appendix is removed through small incisions. This procedure usually causes less pain and fewer problems than an open appendectomy. It also has a shorter recovery time. If the appendix has ruptured and an abscess has formed:  A drain may be placed into the abscess to remove fluid.  Antibiotic medicines may be given through an IV.  The appendix may or may not need to be removed. Follow these instructions at home: If you had surgery, follow instructions from your health care provider about how to care for yourself at home and how to care for your incision. Medicines  Take over-the-counter and prescription medicines only as told by your health care provider.  If you were prescribed an antibiotic medicine, take it as told by your health care provider. Do not stop taking the antibiotic even if you start to feel better. Eating and drinking  Follow instructions from your health care provider about eating restrictions. You may slowly resume a regular diet once your nausea or vomiting stops. General instructions  Do not use any products that contain nicotine or tobacco, such as cigarettes, e-cigarettes, and chewing tobacco. If you need help quitting, ask your health care provider.  Do not drive or use heavy machinery while taking prescription pain medicine.  Ask your health care provider if the  medicine prescribed to you can cause constipation. You may need to take steps to prevent or treat constipation, such as: ? Drink enough fluid to keep your urine pale yellow. ? Take over-the-counter or prescription medicines. ? Eat foods that are high in fiber, such as beans, whole grains, and fresh fruits and vegetables. ? Limit  foods that are high in fat and processed sugars, such as fried or sweet foods.  Keep all follow-up visits as told by your health care provider. This is important. Contact a health care provider if:  There is pus, blood, or excessive drainage coming from your incision.  You have nausea or vomiting. Get help right away if you have:  Worsening abdominal pain.  A fever.  Chills.  Fatigue.  Muscle aches.  Shortness of breath. Summary  Appendicitis is inflammation of the appendix.  This condition may be caused by a blockage in the appendix that leads to infection.  This condition is usually treated with surgery to remove the appendix. This information is not intended to replace advice given to you by your health care provider. Make sure you discuss any questions you have with your health care provider. Document Released: 01/07/2005 Document Revised: 06/25/2017 Document Reviewed: 06/25/2017 Elsevier Interactive Patient Education  2019 [employer].

## 2018-07-18 ENCOUNTER — Emergency Department (HOSPITAL_COMMUNITY): Payer: Medicare Other

## 2018-07-18 ENCOUNTER — Encounter (HOSPITAL_COMMUNITY): Payer: Self-pay

## 2018-07-18 ENCOUNTER — Other Ambulatory Visit: Payer: Self-pay

## 2018-07-18 ENCOUNTER — Inpatient Hospital Stay (HOSPITAL_COMMUNITY)
Admission: EM | Admit: 2018-07-18 | Discharge: 2018-07-27 | DRG: 871 | Disposition: A | Discharge status: Home / Self Care | Payer: Medicare Other | Attending: General Surgery | Admitting: General Surgery

## 2018-07-18 DIAGNOSIS — Z885 Allergy status to narcotic agent status: Secondary | ICD-10-CM

## 2018-07-18 DIAGNOSIS — Z9884 Bariatric surgery status: Secondary | ICD-10-CM | POA: Diagnosis not present

## 2018-07-18 DIAGNOSIS — A419 Sepsis, unspecified organism: Secondary | ICD-10-CM

## 2018-07-18 DIAGNOSIS — Z8719 Personal history of other diseases of the digestive system: Secondary | ICD-10-CM | POA: Diagnosis not present

## 2018-07-18 DIAGNOSIS — R0982 Postnasal drip: Secondary | ICD-10-CM | POA: Diagnosis not present

## 2018-07-18 DIAGNOSIS — E669 Obesity, unspecified: Secondary | ICD-10-CM | POA: Diagnosis present

## 2018-07-18 DIAGNOSIS — G4700 Insomnia, unspecified: Secondary | ICD-10-CM | POA: Diagnosis present

## 2018-07-18 DIAGNOSIS — A4151 Sepsis due to Escherichia coli [E. coli]: Principal | ICD-10-CM | POA: Diagnosis present

## 2018-07-18 DIAGNOSIS — Z6841 Body Mass Index (BMI) 40.0 and over, adult: Secondary | ICD-10-CM | POA: Diagnosis not present

## 2018-07-18 DIAGNOSIS — E876 Hypokalemia: Secondary | ICD-10-CM | POA: Diagnosis not present

## 2018-07-18 DIAGNOSIS — K3532 Acute appendicitis with perforation and localized peritonitis, without abscess: Secondary | ICD-10-CM

## 2018-07-18 DIAGNOSIS — K567 Ileus, unspecified: Secondary | ICD-10-CM | POA: Diagnosis not present

## 2018-07-18 DIAGNOSIS — Z20828 Contact with and (suspected) exposure to other viral communicable diseases: Secondary | ICD-10-CM | POA: Diagnosis present

## 2018-07-18 DIAGNOSIS — K3533 Acute appendicitis with perforation and localized peritonitis, with abscess: Secondary | ICD-10-CM | POA: Diagnosis present

## 2018-07-18 DIAGNOSIS — R1033 Periumbilical pain: Secondary | ICD-10-CM | POA: Diagnosis present

## 2018-07-18 DIAGNOSIS — K651 Peritoneal abscess: Secondary | ICD-10-CM | POA: Diagnosis present

## 2018-07-18 LAB — COMPREHENSIVE METABOLIC PANEL
ALT: 46 U/L — ABNORMAL HIGH (ref 0–44)
AST: 49 U/L — ABNORMAL HIGH (ref 15–41)
Albumin: 2.9 g/dL — ABNORMAL LOW (ref 3.5–5.0)
Alkaline Phosphatase: 80 U/L (ref 38–126)
Anion gap: 14 (ref 5–15)
BUN: <5 mg/dL — ABNORMAL LOW (ref 8–23)
CO2: 22 mmol/L (ref 22–32)
Calcium: 9.3 mg/dL (ref 8.9–10.3)
Chloride: 102 mmol/L (ref 98–111)
Creatinine, Ser: 0.91 mg/dL (ref 0.44–1.00)
GFR calc Af Amer: >60 mL/min (ref >60)
GFR calc non Af Amer: >60 mL/min (ref >60)
Glucose, Bld: 137 mg/dL — ABNORMAL HIGH (ref 70–99)
Potassium: 3.3 mmol/L — ABNORMAL LOW (ref 3.5–5.1)
Sodium: 138 mmol/L (ref 135–145)
Total Bilirubin: 0.8 mg/dL (ref 0.3–1.2)
Total Protein: 7.5 g/dL (ref 6.5–8.1)

## 2018-07-18 LAB — URINALYSIS, ROUTINE W REFLEX MICROSCOPIC
Bilirubin Urine: NEGATIVE
Glucose, UA: NEGATIVE mg/dL
Ketones, ur: 80 mg/dL — AB
Nitrite: NEGATIVE
Protein, ur: 100 mg/dL — AB
Specific Gravity, Urine: 1.019 (ref 1.005–1.030)
Trans Epithel, UA: 1
pH: 6 (ref 5.0–8.0)

## 2018-07-18 LAB — CBC WITH DIFFERENTIAL/PLATELET
Abs Immature Granulocytes: 0.17 10*3/uL — ABNORMAL HIGH (ref 0.00–0.07)
Basophils Absolute: 0.1 10*3/uL (ref 0.0–0.1)
Basophils Relative: 0 %
Eosinophils Absolute: 0 10*3/uL (ref 0.0–0.5)
Eosinophils Relative: 0 %
HCT: 38 % (ref 36.0–46.0)
Hemoglobin: 12.2 g/dL (ref 12.0–15.0)
Immature Granulocytes: 1 %
Lymphocytes Relative: 11 %
Lymphs Abs: 2.4 10*3/uL (ref 0.7–4.0)
MCH: 26.8 pg (ref 26.0–34.0)
MCHC: 32.1 g/dL (ref 30.0–36.0)
MCV: 83.5 fL (ref 80.0–100.0)
Monocytes Absolute: 1 10*3/uL (ref 0.1–1.0)
Monocytes Relative: 5 %
Neutro Abs: 17.6 10*3/uL — ABNORMAL HIGH (ref 1.7–7.7)
Neutrophils Relative %: 83 %
Platelets: 326 10*3/uL (ref 150–400)
RBC: 4.55 MIL/uL (ref 3.87–5.11)
RDW: 14.2 % (ref 11.5–15.5)
WBC: 21.3 10*3/uL — ABNORMAL HIGH (ref 4.0–10.5)
nRBC: 0 % (ref 0.0–0.2)

## 2018-07-18 LAB — LACTIC ACID, PLASMA: Lactic Acid, Venous: 1.4 mmol/L (ref 0.5–1.9)

## 2018-07-18 LAB — SARS CORONAVIRUS 2 BY RT PCR (HOSPITAL ORDER, PERFORMED IN ~~LOC~~ HOSPITAL LAB): SARS Coronavirus 2: NEGATIVE

## 2018-07-18 LAB — LIPASE, BLOOD: Lipase: 40 U/L (ref 11–51)

## 2018-07-18 MED ORDER — ONDANSETRON HCL 4 MG/2ML IJ SOLN
4.0000 mg | Freq: Four times a day (QID) | INTRAMUSCULAR | Status: DC | PRN
  Administered 2018-07-18 – 2018-07-25 (×4): 4 mg via INTRAVENOUS
  Filled 2018-07-18 (×3): qty 2

## 2018-07-18 MED ORDER — METOPROLOL TARTRATE 5 MG/5ML IV SOLN: 5.0000 mg | Freq: Four times a day (QID) | INTRAVENOUS | Status: DC | PRN

## 2018-07-18 MED ORDER — TRAMADOL HCL 50 MG PO TABS: 50.0000 mg | ORAL_TABLET | Freq: Four times a day (QID) | ORAL | Status: DC | PRN

## 2018-07-18 MED ORDER — DIPHENHYDRAMINE HCL 12.5 MG/5ML PO ELIX: 12.5000 mg | ORAL_SOLUTION | Freq: Four times a day (QID) | ORAL | Status: DC | PRN

## 2018-07-18 MED ORDER — MORPHINE SULFATE (PF) 2 MG/ML IV SOLN
2.0000 mg | INTRAVENOUS | Status: DC | PRN
  Administered 2018-07-19: 2 mg via INTRAVENOUS
  Filled 2018-07-18 (×2): qty 1

## 2018-07-18 MED ORDER — SODIUM CHLORIDE 0.9 % IV SOLN: 2.0000 g | Freq: Three times a day (TID) | INTRAVENOUS | Status: DC

## 2018-07-18 MED ORDER — ONDANSETRON HCL 4 MG/2ML IJ SOLN
4.0000 mg | Freq: Once | INTRAMUSCULAR | Status: AC
  Administered 2018-07-18: 4 mg via INTRAVENOUS
  Filled 2018-07-18: qty 2

## 2018-07-18 MED ORDER — MORPHINE SULFATE (PF) 4 MG/ML IV SOLN
4.0000 mg | Freq: Once | INTRAVENOUS | Status: AC
  Administered 2018-07-18: 4 mg via INTRAVENOUS
  Filled 2018-07-18: qty 1

## 2018-07-18 MED ORDER — DIPHENHYDRAMINE HCL 50 MG/ML IJ SOLN
12.5000 mg | Freq: Four times a day (QID) | INTRAMUSCULAR | Status: DC | PRN
  Administered 2018-07-19 – 2018-07-26 (×7): 12.5 mg via INTRAVENOUS
  Filled 2018-07-18 (×7): qty 1

## 2018-07-18 MED ORDER — METRONIDAZOLE IN NACL 5-0.79 MG/ML-% IV SOLN
500.0000 mg | Freq: Once | INTRAVENOUS | Status: AC
  Administered 2018-07-18: 19:00:00 500 mg via INTRAVENOUS
  Filled 2018-07-18: qty 100

## 2018-07-18 MED ORDER — IOHEXOL 300 MG/ML  SOLN
100.0000 mL | Freq: Once | INTRAMUSCULAR | Status: AC | PRN
  Administered 2018-07-18: 100 mL via INTRAVENOUS

## 2018-07-18 MED ORDER — ONDANSETRON HCL 4 MG/2ML IJ SOLN
INTRAMUSCULAR | Status: AC
  Filled 2018-07-18: qty 2

## 2018-07-18 MED ORDER — SODIUM CHLORIDE 0.9 % IV BOLUS
1000.0000 mL | Freq: Once | INTRAVENOUS | Status: AC
  Administered 2018-07-18: 1000 mL via INTRAVENOUS

## 2018-07-18 MED ORDER — SODIUM CHLORIDE 0.9 % IV SOLN
2.0000 g | Freq: Once | INTRAVENOUS | Status: AC
  Administered 2018-07-18: 2 g via INTRAVENOUS
  Filled 2018-07-18: qty 2

## 2018-07-18 MED ORDER — DEXTROSE-NACL 5-0.45 % IV SOLN
INTRAVENOUS | Status: DC
  Administered 2018-07-18 – 2018-07-20 (×2): via INTRAVENOUS

## 2018-07-18 MED ORDER — SODIUM CHLORIDE 0.9 % IV SOLN
2.0000 g | Freq: Three times a day (TID) | INTRAVENOUS | Status: DC
  Administered 2018-07-19 – 2018-07-23 (×12): 2 g via INTRAVENOUS
  Filled 2018-07-18 (×16): qty 2

## 2018-07-18 MED ORDER — ENOXAPARIN SODIUM 40 MG/0.4ML ~~LOC~~ SOLN
40.0000 mg | SUBCUTANEOUS | Status: DC
  Administered 2018-07-19: 40 mg via SUBCUTANEOUS
  Filled 2018-07-18: qty 0.4

## 2018-07-18 MED ORDER — METRONIDAZOLE IN NACL 5-0.79 MG/ML-% IV SOLN
500.0000 mg | Freq: Three times a day (TID) | INTRAVENOUS | Status: DC
  Administered 2018-07-19 – 2018-07-22 (×11): 500 mg via INTRAVENOUS
  Filled 2018-07-18 (×11): qty 100

## 2018-07-18 MED ORDER — ONDANSETRON 4 MG PO TBDP
4.0000 mg | ORAL_TABLET | Freq: Four times a day (QID) | ORAL | Status: DC | PRN
  Administered 2018-07-22: 4 mg via ORAL
  Filled 2018-07-18: qty 1

## 2018-07-18 MED ORDER — OXYCODONE HCL 5 MG PO TABS
5.0000 mg | ORAL_TABLET | ORAL | Status: DC | PRN
  Administered 2018-07-21 – 2018-07-27 (×5): 5 mg via ORAL
  Filled 2018-07-18 (×5): qty 1

## 2018-07-18 NOTE — ED Provider Notes (Signed)
Medical screening examination/treatment/procedure(s) were conducted as a shared visit with non-physician practitioner(s) and myself.  I personally evaluated the patient during the encounter. Briefly, the patient is a 68 y.o. female who presents the ED with nausea, vomiting, abdominal pain.  Patient currently on antibiotics after extended hospital stay where she was treated with IV antibiotics for perforated appendicitis.  Had multiple CT scans but did not have any obvious abscess.  States that she was feeling okay at discharge but over the last day she has had increased nausea, vomiting, diarrhea, abdominal pain.  Pain mostly in the lower abdominal area.  Patient tachycardic and has temperature 100.2.  Patient with a leukocytosis of 21.  Infectious work-up initiated.  Empirically given IV antibiotics.  Will get a CT scan of the abdomen to look for any developing abscesses or source for infection.  CT scan shows worsening abscess in abdomen.  Patient already given IV antibiotics.  Patient to be admitted to general surgery for further care.  This chart was dictated using voice recognition software.  Despite best efforts to proofread,  errors can occur which can change the documentation meaning.     EKG Interpretation  Date/Time:  Saturday July 18 2018 15:38:13 EDT Ventricular Rate:  99 PR Interval:    QRS Duration: 98 QT Interval:  359 QTC Calculation: 461 R Axis:   88 Text Interpretation:  Sinus rhythm Probable left atrial enlargement Borderline right axis deviation Confirmed by Lennice Sites [phone] on 07/18/2018 3:49:12 PM          Lennice Sites, DO 07/18/18 1957

## 2018-07-18 NOTE — ED Notes (Signed)
Attempted report 

## 2018-07-18 NOTE — ED Notes (Signed)
ED TO INPATIENT HANDOFF REPORT  ED Nurse Name and Phone #:  Elliot GurneyWoody [phone]  S Name/Age/Gender Julie PedMargie Degregorio 68 y.o. female Room/Bed: 042C/042C  Code Status   Code Status: Full Code  Home/SNF/Other Home Patient oriented to: self, place, time and situation Is this baseline? Yes   Triage Complete: Triage complete  Chief Complaint emesis/post op problem  Triage Note Pt from home, c/o emesis that began last night, lower abd pain; states that "nothing stays down"; was seen last week for same, diagnosed with ruptured appendix, sent home with antibiotics; endorses diarrhea, but states she took a colace, states "I become bloated if I don't take it". Denies sick sick contacts, denies fevers, cough   Allergies Allergies  Allergen Reactions  . Percocet [Oxycodone-Acetaminophen] Nausea And Vomiting    Level of Care/Admitting Diagnosis ED Disposition    ED Disposition Condition Comment   Admit  [facility] [[id number]]  Level of Care: Med-Surg [16]  Covid Evaluation: Screening Protocol (No Symptoms)  Diagnosis: Abscess of abdominal cavity [facility](HCC) [[id number]][[id number]]  Admitting Physician: CCS, MD [[id number]]  Attending Physician: CCS, MD [[id number]]  Estimated length of stay: past midnight tomorrow  Certification:: I certify this patient will need inpatient services for at least 2 midnights  PT Class (Do Not Modify): Inpatient [101]  PT Acc Code (Do Not Modify): Private [1]       B Medical/Surgery History History reviewed. No pertinent past medical history. Past Surgical History:  Procedure Laterality Date  . LAPAROSCOPIC GASTRIC BANDING       A IV Location/Drains/Wounds Patient Lines/Drains/Airways Status   Active Line/Drains/Airways    Name:   Placement date:   Placement time:   Site:   Days:   Peripheral IV 07/18/18 Right;Anterior Forearm   07/18/18    1703    Forearm   less than 1          Intake/Output Last 24 hours  Intake/Output Summary (Last 24 hours)  at 07/18/2018 2136 Last data filed at 07/18/2018 2048 Gross per 24 hour  Intake 1100 ml  Output -  Net 1100 ml    Labs/Imaging Results for orders placed or performed during the hospital encounter of 07/18/18 (from the past 48 hour(s))  Lipase, blood     Status: None   Collection Time: 07/18/18  3:00 PM  Result Value Ref Range   Lipase 40 11 - 51 U/L    Comment: Performed at [facility], [address] [place], [place] [address]  Comprehensive metabolic panel     Status: Abnormal   Collection Time: 07/18/18  3:00 PM  Result Value Ref Range   Sodium 138 135 - 145 mmol/L   Potassium 3.3 (L) 3.5 - 5.1 mmol/L   Chloride 102 98 - 111 mmol/L   CO2 22 22 - 32 mmol/L   Glucose, Bld 137 (H) 70 - 99 mg/dL   BUN <5 (L) 8 - 23 mg/dL   Creatinine, Ser 9.810.91 0.44 - 1.00 mg/dL   Calcium 9.3 8.9 - 19.110.3 mg/dL   Total Protein 7.5 6.5 - 8.1 g/dL   Albumin 2.9 (L) 3.5 - 5.0 g/dL   AST 49 (H) 15 - 41 U/L   ALT 46 (H) 0 - 44 U/L   Alkaline Phosphatase 80 38 - 126 U/L   Total Bilirubin 0.8 0.3 - 1.2 mg/dL   GFR calc non Af Amer >60 >60 mL/min   GFR calc Af Amer >60 >60 mL/min   Anion gap 14  5 - 15    Comment: Performed at [facility], [address] [place], [place] [address]  CBC with Differential     Status: Abnormal   Collection Time: 07/18/18  3:00 PM  Result Value Ref Range   WBC 21.3 (H) 4.0 - 10.5 K/uL   RBC 4.55 3.87 - 5.11 MIL/uL   Hemoglobin 12.2 12.0 - 15.0 g/dL   HCT 19.138.0 47.836.0 - 29.546.0 %   MCV 83.5 80.0 - 100.0 fL   MCH 26.8 26.0 - 34.0 pg   MCHC 32.1 30.0 - 36.0 g/dL   RDW 62.114.2 30.811.5 - 65.715.5 %   Platelets 326 150 - 400 K/uL   nRBC 0.0 0.0 - 0.2 %   Neutrophils Relative % 83 %   Neutro Abs 17.6 (H) 1.7 - 7.7 K/uL   Lymphocytes Relative 11 %   Lymphs Abs 2.4 0.7 - 4.0 K/uL   Monocytes Relative 5 %   Monocytes Absolute 1.0 0.1 - 1.0 K/uL   Eosinophils Relative 0 %   Eosinophils Absolute 0.0 0.0 - 0.5 K/uL   Basophils Relative 0 %   Basophils Absolute 0.1 0.0 - 0.1  K/uL   Immature Granulocytes 1 %   Abs Immature Granulocytes 0.17 (H) 0.00 - 0.07 K/uL    Comment: Performed at [facility], [address] [place], [place] [address]  Lactic acid, plasma     Status: None   Collection Time: 07/18/18  3:30 PM  Result Value Ref Range   Lactic Acid, Venous 1.4 0.5 - 1.9 mmol/L    Comment: Performed at [facility], [address] [place], [place] [address]  SARS Coronavirus 2 (CEPHEID - Performed in [facility] hospital lab), Hosp Order     Status: None   Collection Time: 07/18/18  3:50 PM   Specimen: Nasopharyngeal Swab  Result Value Ref Range   SARS Coronavirus 2 NEGATIVE NEGATIVE    Comment: (NOTE) If result is NEGATIVE SARS-CoV-2 target nucleic acids are NOT DETECTED. The SARS-CoV-2 RNA is generally detectable in upper and lower  respiratory specimens during the acute phase of infection. The lowest  concentration of SARS-CoV-2 viral copies this assay can detect is 250  copies / mL. A negative result does not preclude SARS-CoV-2 infection  and should not be used as the sole basis for treatment or other  patient management decisions.  A negative result may occur with  improper specimen collection / handling, submission of specimen other  than nasopharyngeal swab, presence of viral mutation(s) within the  areas targeted by this assay, and inadequate number of viral copies  (<250 copies / mL). A negative result must be combined with clinical  observations, patient history, and epidemiological information. If result is POSITIVE SARS-CoV-2 target nucleic acids are DETECTED. The SARS-CoV-2 RNA is generally detectable in upper and lower  respiratory specimens dur ing the acute phase of infection.  Positive  results are indicative of active infection with SARS-CoV-2.  Clinical  correlation with patient history and other diagnostic information is  necessary to determine patient infection status.  Positive results do  not rule out bacterial  infection or co-infection with other viruses. If result is PRESUMPTIVE POSTIVE SARS-CoV-2 nucleic acids MAY BE PRESENT.   A presumptive positive result was obtained on the submitted specimen  and confirmed on repeat testing.  While 2019 novel coronavirus  (SARS-CoV-2) nucleic acids may be present in the submitted sample  additional confirmatory testing may be necessary for epidemiological  and / or clinical management purposes  to differentiate between  SARS-CoV-2 and other Sarbecovirus currently known to infect humans.  If clinically indicated additional testing with an alternate test  methodology [phone]) is advised. The SARS-CoV-2 RNA is generally  detectable in upper and lower respiratory sp ecimens during the acute  phase of infection. The expected result is Negative. Fact Sheet for Patients:  StrictlyIdeas.no Fact Sheet for Healthcare Providers: BankingDealers.co.za This test is not yet approved or cleared by the [place] FDA and has been authorized for detection and/or diagnosis of SARS-CoV-2 by FDA under an Emergency Use Authorization (EUA).  This EUA will remain in effect (meaning this test can be used) for the duration of the COVID-19 declaration under Section 564(b)(1) of the Act, 21 U.S.C. section 360bbb-3(b)(1), unless the authorization is terminated or revoked sooner. Performed at [facility], [place] [address], [place], [place] [address]   Urinalysis, Routine w reflex microscopic     Status: Abnormal   Collection Time: 07/18/18  4:00 PM  Result Value Ref Range   Color, Urine YELLOW YELLOW   APPearance HAZY (A) CLEAR   Specific Gravity, Urine 1.019 1.005 - 1.030   pH 6.0 5.0 - 8.0   Glucose, UA NEGATIVE NEGATIVE mg/dL   Hgb urine dipstick MODERATE (A) NEGATIVE   Bilirubin Urine NEGATIVE NEGATIVE   Ketones, ur 80 (A) NEGATIVE mg/dL   Protein, ur 100 (A) NEGATIVE mg/dL   Nitrite NEGATIVE NEGATIVE    Leukocytes,Ua TRACE (A) NEGATIVE   RBC / HPF 21-50 0 - 5 RBC/hpf   WBC, UA 6-10 0 - 5 WBC/hpf   Bacteria, UA RARE (A) NONE SEEN   Squamous Epithelial / LPF 0-5 0 - 5   Trans Epithel, UA 1    Mucus PRESENT     Comment: Performed at [facility], [place] [address] [place], [place] [address]   Dg Chest Portable 1 View  Result Date: 07/18/2018 CLINICAL DATA:  Fever EXAM: PORTABLE CHEST 1 VIEW COMPARISON:  None. FINDINGS: Cardiomegaly. Pulmonary vascular prominence without focal airspace opacity or overt edema. The visualized skeletal structures are unremarkable. IMPRESSION: Cardiomegaly. Pulmonary vascular prominence without focal airspace opacity or overt edema. Electronically Signed   By: Eddie Candle M.D.   On: 07/18/2018 17:23    Pending Labs Unresulted Labs (From admission, onward)    Start     Ordered   07/25/18 0500  Creatinine, serum  (enoxaparin (LOVENOX)    CrCl >/= 30 ml/min)  Weekly,   R    Comments: while on enoxaparin therapy    07/18/18 2110   07/19/18 0500  Comprehensive metabolic panel  Daily,   R     07/18/18 2110   07/19/18 0500  CBC  Daily,   R     07/18/18 2110   07/18/18 2109  CBC  (enoxaparin (LOVENOX)    CrCl >/= 30 ml/min)  Once,   STAT    Comments: Baseline for enoxaparin therapy IF NOT ALREADY DRAWN.  Notify MD if PLT < 100 K.    07/18/18 2110   07/18/18 2109  Creatinine, serum  (enoxaparin (LOVENOX)    CrCl >/= 30 ml/min)  Once,   STAT    Comments: Baseline for enoxaparin therapy IF NOT ALREADY DRAWN.    07/18/18 2110   07/18/18 1521  Blood Culture (routine x 2)  BLOOD CULTURE X 2,   STAT     07/18/18 1523          Vitals/Pain  Today's Vitals   07/18/18 2051 07/18/18 2052 07/18/18 2100 07/18/18 2130  BP:  (!) 152/92 139/78 134/78  Pulse:  (!) 102 (!) 103 100  Resp:  (!) 23 (!) 25 (!) 24  Temp:      TempSrc:      SpO2:  96% 95% 90%  Weight:      Height:      PainSc: 6        Isolation Precautions No active  isolations  Medications Medications  ceFEPIme (MAXIPIME) 2 g in sodium chloride 0.9 % 100 mL IVPB (has no administration in time range)  enoxaparin (LOVENOX) injection 40 mg (has no administration in time range)  dextrose 5 %-0.45 % sodium chloride infusion (has no administration in time range)  oxyCODONE (Oxy IR/ROXICODONE) immediate release tablet 5 mg (has no administration in time range)  morphine 2 MG/ML injection 2 mg (has no administration in time range)  ondansetron (ZOFRAN-ODT) disintegrating tablet 4 mg ( Oral See Alternative 07/18/18 2125)    Or  ondansetron (ZOFRAN) injection 4 mg (4 mg Intravenous Given 07/18/18 2125)  metoprolol tartrate (LOPRESSOR) injection 5 mg (has no administration in time range)  diphenhydrAMINE (BENADRYL) 12.5 MG/5ML elixir 12.5 mg (has no administration in time range)    Or  diphenhydrAMINE (BENADRYL) injection 12.5 mg (has no administration in time range)  ceFEPIme (MAXIPIME) 2 g in sodium chloride 0.9 % 100 mL IVPB (has no administration in time range)    And  metroNIDAZOLE (FLAGYL) IVPB 500 mg (has no administration in time range)  ceFEPIme (MAXIPIME) 2 g in sodium chloride 0.9 % 100 mL IVPB (0 g Intravenous Stopped 07/18/18 1838)  metroNIDAZOLE (FLAGYL) IVPB 500 mg (0 mg Intravenous Stopped 07/18/18 2104)  morphine 4 MG/ML injection 4 mg (4 mg Intravenous Given 07/18/18 1722)  ondansetron (ZOFRAN) injection 4 mg (4 mg Intravenous Given 07/18/18 1722)  iohexol (OMNIPAQUE) 300 MG/ML solution 100 mL (100 mLs Intravenous Contrast Given 07/18/18 1734)  sodium chloride 0.9 % bolus 1,000 mL (0 mLs Intravenous Stopped 07/18/18 2048)  morphine 4 MG/ML injection 4 mg (4 mg Intravenous Given 07/18/18 2125)    Mobility walks Low fall risk   Focused Assessments Gastro assessment: bloating, nausea, diarrhea   R Recommendations: See Admitting Provider Note  Report given to:   Additional Notes:

## 2018-07-18 NOTE — Progress Notes (Signed)
Pharmacy Antibiotic Note  Julie Richards is a 68 y.o. female admitted on 07/18/2018 with intra-abdominal infection. Pharmacy has been consulted for cefepime dosing. Pt with Tmax 100.2 and WBC is elevated at 21.3. Scr is WNL. Pt was recently medically managed wfor perforated appendicitis and was discharged on augmentin on 6/25.   Plan: Cefepime 2gm IV Q8H F/u renal fxn, C&S, clinical status and peak/trough at SS  Height: 5' 8.5" (174 cm) Weight: 295 lb (133.8 kg) IBW/kg (Calculated) : 65.05  Temp (24hrs), Avg:99.1 F (37.3 C), Min:99.1 F (37.3 C), Max:99.1 F (37.3 C)  Recent Labs  Lab 07/12/18 0402 07/14/18 0338 07/14/18 0648 07/15/18 0244 07/16/18 0254  WBC 16.7*  --  14.0* 14.6* 11.3*  CREATININE 0.84 0.79  --   --   --     Estimated Creatinine Clearance: 102.5 mL/min (by C-G formula based on SCr of 0.79 mg/dL).    Allergies  Allergen Reactions  . Percocet [Oxycodone-Acetaminophen] Nausea And Vomiting    Antimicrobials this admission: Cefepime 6/27>> Flagyl x 1 6/27  Dose adjustments this admission: N/A  Microbiology results: Pending  Thank you for allowing pharmacy to be a part of this patient's care.  Lamoine Fredricksen, Rande Lawman 07/18/2018 3:26 PM

## 2018-07-18 NOTE — ED Triage Notes (Signed)
Pt from home, c/o emesis that began last night, lower abd pain; states that "nothing stays down"; was seen last week for same, diagnosed with ruptured appendix, sent home with antibiotics; endorses diarrhea, but states she took a colace, states "I become bloated if I don't take it". Denies sick sick contacts, denies fevers, cough

## 2018-07-18 NOTE — H&P (Signed)
Reason for Consult:abdominal pain  Julie Richards is an 68 y.o. female.  HPI: 68 yo female was admitted 2 weeks ago with ruptured appendicitis. At the time there was no drainable abscess. She was admitted for IV antibiotics. She improved and was discharge home 1 week ago. She did well but over the last few days has had increasing bloating, abdominal pain. Now she has nausea and vomiting and the pain has increased in severity.  History reviewed. No pertinent past medical history.  Past Surgical History:  Procedure Laterality Date  . LAPAROSCOPIC GASTRIC BANDING      No family history on file.  Social History:  reports that she has never smoked. She has never used smokeless tobacco. She reports that she does not drink alcohol or use drugs.  Allergies:  Allergies  Allergen Reactions  . Percocet [Oxycodone-Acetaminophen] Nausea And Vomiting    Medications: I have reviewed the patient's current medications.  Results for orders placed or performed during the hospital encounter of 07/18/18 (from the past 48 hour(s))  Lipase, blood     Status: None   Collection Time: 07/18/18  3:00 PM  Result Value Ref Range   Lipase 40 11 - 51 U/L    Comment: Performed at [facility], [place] [address] [place], [place] [address]  Comprehensive metabolic panel     Status: Abnormal   Collection Time: 07/18/18  3:00 PM  Result Value Ref Range   Sodium 138 135 - 145 mmol/L   Potassium 3.3 (L) 3.5 - 5.1 mmol/L   Chloride 102 98 - 111 mmol/L   CO2 22 22 - 32 mmol/L   Glucose, Bld 137 (H) 70 - 99 mg/dL   BUN <5 (L) 8 - 23 mg/dL   Creatinine, Ser 0.91 0.44 - 1.00 mg/dL   Calcium 9.3 8.9 - 10.3 mg/dL   Total Protein 7.5 6.5 - 8.1 g/dL   Albumin 2.9 (L) 3.5 - 5.0 g/dL   AST 49 (H) 15 - 41 U/L   ALT 46 (H) 0 - 44 U/L   Alkaline Phosphatase 80 38 - 126 U/L   Total Bilirubin 0.8 0.3 - 1.2 mg/dL   GFR calc non Af Amer >60 >60 mL/min   GFR calc Af Amer >60 >60 mL/min   Anion gap 14 5 - 15   Comment: Performed at [facility], [place] [address] [place], [place] [address]  CBC with Differential     Status: Abnormal   Collection Time: 07/18/18  3:00 PM  Result Value Ref Range   WBC 21.3 (H) 4.0 - 10.5 K/uL   RBC 4.55 3.87 - 5.11 MIL/uL   Hemoglobin 12.2 12.0 - 15.0 g/dL   HCT 38.0 36.0 - 46.0 %   MCV 83.5 80.0 - 100.0 fL   MCH 26.8 26.0 - 34.0 pg   MCHC 32.1 30.0 - 36.0 g/dL   RDW 14.2 11.5 - 15.5 %   Platelets 326 150 - 400 K/uL   nRBC 0.0 0.0 - 0.2 %   Neutrophils Relative % 83 %   Neutro Abs 17.6 (H) 1.7 - 7.7 K/uL   Lymphocytes Relative 11 %   Lymphs Abs 2.4 0.7 - 4.0 K/uL   Monocytes Relative 5 %   Monocytes Absolute 1.0 0.1 - 1.0 K/uL   Eosinophils Relative 0 %   Eosinophils Absolute 0.0 0.0 - 0.5 K/uL   Basophils Relative 0 %   Basophils Absolute 0.1 0.0 - 0.1 K/uL   Immature Granulocytes 1 %  Abs Immature Granulocytes 0.17 (H) 0.00 - 0.07 K/uL    Comment: Performed at [facility], [address] [place], [place] [address]  Lactic acid, plasma     Status: None   Collection Time: 07/18/18  3:30 PM  Result Value Ref Range   Lactic Acid, Venous 1.4 0.5 - 1.9 mmol/L    Comment: Performed at [facility], [address] [place], [place] [address]  SARS Coronavirus 2 (CEPHEID - Performed in [facility] hospital lab), Hosp Order     Status: None   Collection Time: 07/18/18  3:50 PM   Specimen: Nasopharyngeal Swab  Result Value Ref Range   SARS Coronavirus 2 NEGATIVE NEGATIVE    Comment: (NOTE) If result is NEGATIVE SARS-CoV-2 target nucleic acids are NOT DETECTED. The SARS-CoV-2 RNA is generally detectable in upper and lower  respiratory specimens during the acute phase of infection. The lowest  concentration of SARS-CoV-2 viral copies this assay can detect is 250  copies / mL. A negative result does not preclude SARS-CoV-2 infection  and should not be used as the sole basis for treatment or other  patient management decisions.  A negative  result may occur with  improper specimen collection / handling, submission of specimen other  than nasopharyngeal swab, presence of viral mutation(s) within the  areas targeted by this assay, and inadequate number of viral copies  (<250 copies / mL). A negative result must be combined with clinical  observations, patient history, and epidemiological information. If result is POSITIVE SARS-CoV-2 target nucleic acids are DETECTED. The SARS-CoV-2 RNA is generally detectable in upper and lower  respiratory specimens dur ing the acute phase of infection.  Positive  results are indicative of active infection with SARS-CoV-2.  Clinical  correlation with patient history and other diagnostic information is  necessary to determine patient infection status.  Positive results do  not rule out bacterial infection or co-infection with other viruses. If result is PRESUMPTIVE POSTIVE SARS-CoV-2 nucleic acids MAY BE PRESENT.   A presumptive positive result was obtained on the submitted specimen  and confirmed on repeat testing.  While 2019 novel coronavirus  (SARS-CoV-2) nucleic acids may be present in the submitted sample  additional confirmatory testing may be necessary for epidemiological  and / or clinical management purposes  to differentiate between  SARS-CoV-2 and other Sarbecovirus currently known to infect humans.  If clinically indicated additional testing with an alternate test  methodology [phone] is advised. The SARS-CoV-2 RNA is generally  detectable in upper and lower respiratory sp ecimens during the acute  phase of infection. The expected result is Negative. Fact Sheet for Patients:  BoilerBrush.com.cyhttps://www.fda.gov/media/136312/download Fact Sheet for Healthcare Providers: https://pope.com/https://www.fda.gov/media/136313/download This test is not yet approved or cleared by the [place] FDA and has been authorized for detection and/or diagnosis of SARS-CoV-2 by FDA under an Emergency Use Authorization  (EUA).  This EUA will remain in effect (meaning this test can be used) for the duration of the COVID-19 declaration under Section 564(b)(1) of the Act, 21 U.S.C. section 360bbb-3(b)(1), unless the authorization is terminated or revoked sooner. Performed at [facility], [address] [place], [place] [address]   Urinalysis, Routine w reflex microscopic     Status: Abnormal   Collection Time: 07/18/18  4:00 PM  Result Value Ref Range   Color, Urine YELLOW YELLOW   APPearance HAZY (A) CLEAR   Specific Gravity, Urine 1.019 1.005 - 1.030   pH 6.0 5.0 - 8.0  Glucose, UA NEGATIVE NEGATIVE mg/dL   Hgb urine dipstick MODERATE (A) NEGATIVE   Bilirubin Urine NEGATIVE NEGATIVE   Ketones, ur 80 (A) NEGATIVE mg/dL   Protein, ur 161100 (A) NEGATIVE mg/dL   Nitrite NEGATIVE NEGATIVE   Leukocytes,Ua TRACE (A) NEGATIVE   RBC / HPF 21-50 0 - 5 RBC/hpf   WBC, UA 6-10 0 - 5 WBC/hpf   Bacteria, UA RARE (A) NONE SEEN   Squamous Epithelial / LPF 0-5 0 - 5   Trans Epithel, UA 1    Mucus PRESENT     Comment: Performed at [facility], [address] [place], [place] [address]    Dg Chest Portable 1 View  Result Date: 07/18/2018 CLINICAL DATA:  Fever EXAM: PORTABLE CHEST 1 VIEW COMPARISON:  None. FINDINGS: Cardiomegaly. Pulmonary vascular prominence without focal airspace opacity or overt edema. The visualized skeletal structures are unremarkable. IMPRESSION: Cardiomegaly. Pulmonary vascular prominence without focal airspace opacity or overt edema. Electronically Signed   By: Lauralyn PrimesAlex  Bibbey M.D.   On: 07/18/2018 17:23    Review of Systems  Constitutional: Negative for chills and fever.  HENT: Negative for hearing loss.   Eyes: Negative for blurred vision and double vision.  Respiratory: Negative for cough and hemoptysis.   Cardiovascular: Negative for chest pain and palpitations.  Gastrointestinal: Positive for abdominal pain, nausea and vomiting.  Genitourinary: Negative for dysuria and  urgency.  Musculoskeletal: Negative for myalgias and neck pain.  Skin: Negative for itching and rash.  Neurological: Negative for dizziness, tingling and headaches.  Endo/Heme/Allergies: Does not bruise/bleed easily.  Psychiatric/Behavioral: Negative for depression and suicidal ideas.   Blood pressure (!) 152/92, pulse (!) 102, temperature 99.6 F (37.6 C), temperature source Oral, resp. rate (!) 23, height 5' 8.5" (1.74 m), weight 133.8 kg, SpO2 96 %. Physical Exam  Vitals reviewed. Constitutional: She is oriented to person, place, and time. She appears well-developed and well-nourished.  HENT:  Head: Normocephalic and atraumatic.  Eyes: Pupils are equal, round, and reactive to light. Conjunctivae and EOM are normal.  Neck: Normal range of motion. Neck supple.  Cardiovascular: Normal rate and regular rhythm.  Respiratory: Effort normal and breath sounds normal.  GI: Soft. Bowel sounds are normal. She exhibits no distension. There is abdominal tenderness in the right lower quadrant and periumbilical area. There is guarding.  Musculoskeletal: Normal range of motion.  Neurological: She is alert and oriented to person, place, and time.  Skin: Skin is warm and dry.  Psychiatric: She has a normal mood and affect. Her behavior is normal.      Assessment/Plan: 68 yo female with ruptured appendicitis. Previously she had no drainable abscess. Now she has a larger 9cm abscess as well as exam and lab findings consistent with recurrent abdominal abscess. -IV abx -pain control -IR consult for drainage of abscess -NPO  De BlanchLuke Aaron Ryver Zadrozny 07/18/2018, 9:10 PM

## 2018-07-18 NOTE — ED Notes (Signed)
Patient transported to CT 

## 2018-07-18 NOTE — ED Provider Notes (Signed)
[facility] EMERGENCY DEPARTMENT Provider Note   CSN: [id number] Arrival date & time: 07/18/18  1441    History   Chief Complaint Chief Complaint  Patient presents with   Emesis    HPI Julie Richards is a 68 y.o. female who presents today for evaluation of abdominal pain.  She was seen on 07/09/2018 and discharged on 6/25 after medical management of a ruptured appendix.  She reports that the rest of the day on 6/25 and for the majority of the day on 6/26 she was well, able to hold down soup and her antibiotics without difficulty.  Last night she began feeling bloated like her abdomen is tight with nausea, vomiting, and diarrhea.  She reports that she has had 10 episodes of vomiting today and about 5 episodes of loose unformed diarrhea without blood in either.  She says that her pain has shifted from her right lower quadrant to the umbilical area.  She denies any fevers at home however she has not checked.  She has not had any ibuprofen or Tylenol today.  She has been unable to take her antibiotics as she vomited them every time she tried today.        HPI  History reviewed. No pertinent past medical history.  Patient Active Problem List   Diagnosis Date Noted   Perforated appendicitis 07/09/2018    Past Surgical History:  Procedure Laterality Date   LAPAROSCOPIC GASTRIC BANDING       OB History   No obstetric history on file.      Home Medications    Prior to Admission medications   Medication Sig Start Date End Date Taking? Authorizing Provider  acetaminophen (TYLENOL) 325 MG tablet Take 2 tablets (650 mg total) by mouth every 6 (six) hours as needed for mild pain. 07/16/18   Rayburn, Alphonsus SiasKelly A, PA-C  amoxicillin-clavulanate (AUGMENTIN) 875-125 MG tablet Take 1 tablet by mouth every 12 (twelve) hours for 7 doses. 07/16/18 07/20/18  Rayburn, Alphonsus SiasKelly A, PA-C  Cholecalciferol (VITAMIN D3) 1.25 MG (50000 UT) CAPS Take 1 capsule by mouth daily.    [provider]  ibuprofen (ADVIL) 400 MG tablet Take 1 tablet (400 mg total) by mouth every 6 (six) hours as needed for moderate pain. 07/16/18   Rayburn, Alphonsus SiasKelly A, PA-C  Multiple Vitamin (MULTIVITAMIN WITH MINERALS) TABS tablet Take 1 tablet by mouth daily.    [provider]  OVER THE COUNTER MEDICATION Take 1 tablet by mouth daily. Mega 4    [provider]  traMADol (ULTRAM) 50 MG tablet Take 1-2 tablets (50-100 mg total) by mouth every 6 (six) hours as needed for moderate pain or severe pain. 07/16/18   Rayburn, Alphonsus SiasKelly A, PA-C    Family History No family history on file.  Social History Social History   Tobacco Use   Smoking status: Never Smoker   Smokeless tobacco: Never Used  Substance Use Topics   Alcohol use: Never    Frequency: Never   Drug use: Never     Allergies   Percocet [oxycodone-acetaminophen]   Review of Systems Review of Systems  Constitutional: Positive for appetite change and fatigue. Negative for chills, diaphoresis and fever.  Gastrointestinal: Positive for abdominal distention, abdominal pain, diarrhea, nausea and vomiting.  Genitourinary: Negative for dysuria, flank pain, hematuria and urgency.  Musculoskeletal: Negative for back pain and neck pain.  Skin: Negative for color change and rash.  Neurological: Positive for weakness (Generalized, overall. ). Negative for headaches.  All other systems reviewed and are negative.    Physical Exam Updated Vital Signs BP (!) 159/96    Pulse (!) 105    Temp 99.6 F (37.6 C) (Oral)    Resp (!) 24    Ht 5' 8.5" (1.74 m)    Wt 133.8 kg    SpO2 94%    BMI 44.20 kg/m   Physical Exam Vitals signs and nursing note reviewed.  Constitutional:      Appearance: She is well-developed. She is ill-appearing.  HENT:     Head: Normocephalic and atraumatic.  Eyes:     General: No scleral icterus.       Right eye: No discharge.        Left eye: No discharge.     Conjunctiva/sclera: Conjunctivae  normal.  Neck:     Musculoskeletal: Normal range of motion.  Cardiovascular:     Rate and Rhythm: Regular rhythm. Tachycardia present.     Pulses: Normal pulses.     Heart sounds: Normal heart sounds.  Pulmonary:     Effort: Pulmonary effort is normal. No respiratory distress.     Breath sounds: Normal breath sounds. No stridor.  Abdominal:     General: Bowel sounds are decreased. There is distension.     Palpations: Abdomen is soft.     Tenderness: There is abdominal tenderness. There is rebound.     Hernia: No hernia is present.     Comments: Distention of abdomen, with swelling visualized in the LUQ.  Abdomen is generally TTP, primarily in the periumbilical area with rebound tenderness, involuntary guarding, and pressure in the LLQ causes pain in RLQ.   Musculoskeletal:        General: No deformity.  Skin:    General: Skin is warm and dry.  Neurological:     General: No focal deficit present.     Mental Status: She is alert and oriented to person, place, and time.     Motor: No abnormal muscle tone.  Psychiatric:        Mood and Affect: Mood normal.        Behavior: Behavior normal.      ED Treatments / Results  Labs (all labs ordered are listed, but only abnormal results are displayed) Labs Reviewed  COMPREHENSIVE METABOLIC PANEL - Abnormal; Notable for the following components:      Result Value   Potassium 3.3 (*)    Glucose, Bld 137 (*)    BUN <5 (*)    Albumin 2.9 (*)    AST 49 (*)    ALT 46 (*)    All other components within normal limits  URINALYSIS, ROUTINE W REFLEX MICROSCOPIC - Abnormal; Notable for the following components:   APPearance HAZY (*)    Hgb urine dipstick MODERATE (*)    Ketones, ur 80 (*)    Protein, ur 100 (*)    Leukocytes,Ua TRACE (*)    Bacteria, UA RARE (*)    All other components within normal limits  CBC WITH DIFFERENTIAL/PLATELET - Abnormal; Notable for the following components:   WBC 21.3 (*)    Neutro Abs 17.6 (*)    Abs  Immature Granulocytes 0.17 (*)    All other components within normal limits  SARS CORONAVIRUS 2 (HOSPITAL ORDER, PERFORMED IN  HOSPITAL LAB)  CULTURE, BLOOD (ROUTINE X 2)  CULTURE, BLOOD (ROUTINE X 2)  LIPASE, BLOOD  LACTIC ACID, PLASMA    EKG EKG Interpretation  Date/Time:  Saturday July 18 2018 15:38:13 EDT Ventricular Rate:  99 PR Interval:    QRS Duration: 98 QT Interval:  359 QTC Calculation: 461 R Axis:   88 Text Interpretation:  Sinus rhythm Probable left atrial enlargement Borderline right axis deviation Confirmed by Lennice Sites [phone]) on 07/18/2018 3:49:12 PM   Radiology Ct Abdomen Pelvis W Contrast  Result Date: 07/18/2018 CLINICAL DATA:  Abdominal pain and nausea and vomiting for 10 days. Perforated appendicitis with abscess. EXAM: CT ABDOMEN AND PELVIS WITH CONTRAST TECHNIQUE: Multidetector CT imaging of the abdomen and pelvis was performed using the standard protocol following bolus administration of intravenous contrast. CONTRAST:  143mL OMNIPAQUE IOHEXOL 300 MG/ML  SOLN COMPARISON:  07/13/2018 FINDINGS: Lower Chest: No acute findings. Hepatobiliary: No hepatic masses identified. Small right hepatic lobe cysts remains stable. Gallbladder is unremarkable. Pancreas:  No mass or inflammatory changes. Spleen: Within normal limits in size and appearance. Adrenals/Urinary Tract: No masses identified. Small right renal calculi again seen no evidence of ureteral calculi or hydronephrosis. Stomach/Bowel: Gastric lap band again seen in place. Increased size of fluid and gas collection in right lower quadrant with surrounding inflammatory changes. This measures 9.5 x 6.6 cm, compared to 4.7 x 4.2 cm previously. Associated wall thickening of the cecum and ascending colon is again demonstrated. These findings are consistent with ruptured appendicitis with enlarging periappendiceal abscess. Vascular/Lymphatic: No pathologically enlarged lymph nodes. No abdominal aortic  aneurysm. Reproductive: Stable small uterine fibroids measuring up to 2 cm. No adnexal mass or free pelvic fluid. Other:  None. Musculoskeletal:  No suspicious bone lesions identified. IMPRESSION: 1. Ruptured appendicitis, with increased size of periappendiceal abscess no measuring 9.5 cm 2. Stable right nephrolithiasis, without ureteral calculi or hydronephrosis. 3. Stable small uterine fibroids. Electronically Signed   By: Marlaine Hind M.D.   On: 07/18/2018 18:23   Dg Chest Portable 1 View  Result Date: 07/18/2018 CLINICAL DATA:  Fever EXAM: PORTABLE CHEST 1 VIEW COMPARISON:  None. FINDINGS: Cardiomegaly. Pulmonary vascular prominence without focal airspace opacity or overt edema. The visualized skeletal structures are unremarkable. IMPRESSION: Cardiomegaly. Pulmonary vascular prominence without focal airspace opacity or overt edema. Electronically Signed   By: Eddie Candle M.D.   On: 07/18/2018 17:23   CT abdomen pelvis results visible in PACS.   Impressions indicate ruptured appendicitis with increased size of periappendiceal abscess now measuring 9.5 cm   Procedures .Critical Care Performed by: Lorin Glass, PA-C Authorized by: Lorin Glass, PA-C   Critical care provider statement:    Critical care time (minutes):  45   Critical care time was exclusive of:  Separately billable procedures and treating other patients   Critical care was necessary to treat or prevent imminent or life-threatening deterioration of the following conditions:  Sepsis   Critical care was time spent personally by me on the following activities:  Discussions with consultants, evaluation of patient's response to treatment, examination of patient, ordering and performing treatments and interventions, ordering and review of laboratory studies, ordering and review of radiographic studies, pulse oximetry, re-evaluation of patient's condition, obtaining history from patient or surrogate and review of old  charts   (including critical care time)  Medications Ordered in ED Medications  metroNIDAZOLE (FLAGYL) IVPB 500 mg (500 mg Intravenous New Bag/Given 07/18/18 1838)  ceFEPIme (MAXIPIME) 2 g in sodium chloride 0.9 % 100 mL IVPB (has no administration in time range)  ceFEPIme (MAXIPIME) 2 g in sodium chloride 0.9 % 100 mL IVPB (0 g Intravenous Stopped 07/18/18 1838)  morphine 4 MG/ML  injection 4 mg (4 mg Intravenous Given 07/18/18 1722)  ondansetron (ZOFRAN) injection 4 mg (4 mg Intravenous Given 07/18/18 1722)  iohexol (OMNIPAQUE) 300 MG/ML solution 100 mL (100 mLs Intravenous Contrast Given 07/18/18 1734)  sodium chloride 0.9 % bolus 1,000 mL (1,000 mLs Intravenous New Bag/Given 07/18/18 1808)     Initial Impression / Assessment and Plan / ED Course  I have reviewed the triage vital signs and the nursing notes.  Pertinent labs & imaging results that were available during my care of the patient were reviewed by me and considered in my medical decision making (see chart for details).  Clinical Course as of Jul 18 1850  Sat Jul 18, 2018  1749 Lactic acid is not elevated above 4, she is not hypotensive, therefore 30/kg fluid bolus not given.  Lactic Acid, Venous: 1.4 [EH]    Clinical Course User Index [EH] Cristina GongHammond, Darly Fails W, PA-C      Patient presents today for evaluation of worsening nausea, vomiting, and diarrhea and pain in the setting of a ruptured appendix treated with medical management.  On initial exam concern for sepsis as she is tachycardic and tachypneic.  Code sepsis was called and she was started on broad-spectrum intra-abdominal antibiotics.  Labs were obtained and reviewed, her white count is 21.3.  Her urine has Rare bacteria with 0-5 squamous epithelial cells.  Potassium is slightly low at 3.3, however she does not have other significant electrolyte or hematologic derangements.  Lactic acid is 1.4.  She was not given 30/kg fluid bolus as her lactic acid was not elevated above  4 and she is not hypotensive.    CT abdomen pelvis was obtained showing that her abscess/fluid collection has significantly increased now measuring 9.5 cm.  I suspect that this is the cause of her sepsis and symptoms today.  This patient was seen as a shared visit with Dr. Lockie Molauratolo.  I spoke with Dr. Ellouise NewerKessinger from general surgery who will admit the patient for further management.  Patient's pain was treated in the emergency room with morphine, and she was given Zofran for nausea.  Patient remained hemodynamically stable with mild tachycardia, tachypnea and hypertension while under my care.  Patient to be admitted to the hospital.    Final Clinical Impressions(s) / ED Diagnoses   Final diagnoses:  Perforated appendicitis  Sepsis without acute organ dysfunction, due to unspecified organism [facility](HCC)    ED Discharge Orders    None       Cristina GongHammond, Renel Ende W, PA-C 07/18/18 2256    Virgina Norfolkuratolo, Adam, DO 07/18/18 2343

## 2018-07-18 NOTE — ED Notes (Signed)
Iv team at bedside  

## 2018-07-19 ENCOUNTER — Encounter (HOSPITAL_COMMUNITY): Payer: Self-pay | Admitting: *Deleted

## 2018-07-19 LAB — COMPREHENSIVE METABOLIC PANEL
ALT: 35 U/L (ref 0–44)
AST: 29 U/L (ref 15–41)
Albumin: 2.4 g/dL — ABNORMAL LOW (ref 3.5–5.0)
Alkaline Phosphatase: 72 U/L (ref 38–126)
Anion gap: 9 (ref 5–15)
BUN: 5 mg/dL — ABNORMAL LOW (ref 8–23)
CO2: 23 mmol/L (ref 22–32)
Calcium: 8.3 mg/dL — ABNORMAL LOW (ref 8.9–10.3)
Chloride: 105 mmol/L (ref 98–111)
Creatinine, Ser: 0.84 mg/dL (ref 0.44–1.00)
GFR calc Af Amer: >60 mL/min (ref >60)
GFR calc non Af Amer: >60 mL/min (ref >60)
Glucose, Bld: 109 mg/dL — ABNORMAL HIGH (ref 70–99)
Potassium: 3.2 mmol/L — ABNORMAL LOW (ref 3.5–5.1)
Sodium: 137 mmol/L (ref 135–145)
Total Bilirubin: 0.7 mg/dL (ref 0.3–1.2)
Total Protein: 6.4 g/dL — ABNORMAL LOW (ref 6.5–8.1)

## 2018-07-19 LAB — CBC
HCT: 34 % — ABNORMAL LOW (ref 36.0–46.0)
Hemoglobin: 11.2 g/dL — ABNORMAL LOW (ref 12.0–15.0)
MCH: 27.4 pg (ref 26.0–34.0)
MCHC: 32.9 g/dL (ref 30.0–36.0)
MCV: 83.1 fL (ref 80.0–100.0)
Platelets: 309 10*3/uL (ref 150–400)
RBC: 4.09 MIL/uL (ref 3.87–5.11)
RDW: 14.3 % (ref 11.5–15.5)
WBC: 23.2 10*3/uL — ABNORMAL HIGH (ref 4.0–10.5)
nRBC: 0 % (ref 0.0–0.2)

## 2018-07-19 LAB — PROTIME-INR
INR: 1.2 (ref 0.8–1.2)
Prothrombin Time: 15 seconds (ref 11.4–15.2)

## 2018-07-19 MED ORDER — HYDROMORPHONE HCL 1 MG/ML IJ SOLN
1.0000 mg | INTRAMUSCULAR | Status: DC | PRN
  Administered 2018-07-19 – 2018-07-25 (×18): 1 mg via INTRAVENOUS
  Filled 2018-07-19 (×20): qty 1

## 2018-07-19 MED ORDER — ENOXAPARIN SODIUM 40 MG/0.4ML ~~LOC~~ SOLN: 40.0000 mg | SUBCUTANEOUS | Status: DC

## 2018-07-19 NOTE — Consult Note (Signed)
Chief Complaint: Patient was seen in consultation today for abdominal abscess aspiration/possible drain placement.  Referring Physician(s): Dr. Feliciana RossettiLuke Kinsinger  Supervising Physician: Gilmer MorWagner, Jaime  Patient [facility]: [facility]  History of Present Illness: Julie Richards is a 68 y.o. female with no significant past medical history who originally [facility] with abdominal pain on 07/09/18 and was ultimately found to have acute perforated appendicitis without drainable collections. She was treated conservatively with bowel rest and antibiotics. She was discharged on 07/16/18 with PO antibiotics and close follow up with surgery. Unfortunately she began to experience worsening abdominal pain, bloating, nausea, vomiting and diarrhea and again [facility] on 07/18/18. CT abd/pelvis with contrast was performed which showed rupture appendicitis with increase size of periappendiceal abscess now measuring 9.5 cm. IR has been consulted for aspiration and possible drain placement.  Patient sitting up in bed during exam, appears uncomfortable. States her abdominal pain is worse right now because she is due for pain medications - she reports several episodes of emesis this morning that "looked like spit up because I have nothing left." She denies any other complaints. She is hopeful that her appendix will eventually be removed. She states understanding of requested procedure and wishes to proceed.   History reviewed. No pertinent past medical history.  Past Surgical History:  Procedure Laterality Date   LAPAROSCOPIC GASTRIC BANDING      Allergies: Percocet [oxycodone-acetaminophen]  Medications: Prior to Admission medications   Medication Sig Start Date End Date Taking? Authorizing Provider  acetaminophen (TYLENOL) 325 MG tablet Take 2 tablets (650 mg total) by mouth every 6 (six) hours as needed for mild pain. Patient taking differently: Take 325 mg by mouth every 6 (six) hours as  needed for mild pain.  07/16/18  Yes Rayburn, Tresa EndoKelly A, PA-C  amoxicillin-clavulanate (AUGMENTIN) 875-125 MG tablet Take 1 tablet by mouth every 12 (twelve) hours for 7 doses. 07/16/18 07/20/18 Yes Rayburn, Alphonsus SiasKelly A, PA-C  Cholecalciferol (VITAMIN D3 PO) Take 1 capsule by mouth daily.    Yes [provider]  docusate sodium (COLACE) 100 MG capsule Take 100 mg by mouth daily as needed for mild constipation (bloatin).   Yes [provider]  ibuprofen (ADVIL) 400 MG tablet Take 1 tablet (400 mg total) by mouth every 6 (six) hours as needed for moderate pain. 07/16/18  Yes Rayburn, Alphonsus SiasKelly A, PA-C  Multiple Vitamin (MULTIVITAMIN WITH MINERALS) TABS tablet Take 1 tablet by mouth daily.   Yes [provider]  OVER THE COUNTER MEDICATION Take 1 capsule by mouth daily. Mega 4    Yes [provider]  traMADol (ULTRAM) 50 MG tablet Take 1-2 tablets (50-100 mg total) by mouth every 6 (six) hours as needed for moderate pain or severe pain. 07/16/18  Yes Rayburn, Alphonsus SiasKelly A, PA-C     No family history on file.  Social History   Socioeconomic History   Marital status: Widowed    Spouse name: Not on file   Number of children: Not on file   Years of education: Not on file   Highest education level: Not on file  Occupational History   Not on file  Social Needs   Financial resource strain: Not on file   Food insecurity    Worry: Not on file    Inability: Not on file   Transportation needs    Medical: Not on file    Non-medical: Not on file  Tobacco Use   Smoking status: Never Smoker  Smokeless tobacco: Never Used  Substance and Sexual Activity   Alcohol use: Never    Frequency: Never   Drug use: Never   Sexual activity: Not on file  Lifestyle   Physical activity    Days per week: Not on file    Minutes per session: Not on file   Stress: Not on file  Relationships   Social connections    Talks on phone: Not on file    Gets together: Not on file     Attends religious service: Not on file    Active member of club or organization: Not on file    Attends meetings of clubs or organizations: Not on file    Relationship status: Not on file  Other Topics Concern   Not on file  Social History Narrative   Not on file     Review of Systems: A 12 point ROS discussed and pertinent positives are indicated in the HPI above.  All other systems are negative.  Review of Systems  Constitutional: Positive for fatigue. Negative for chills and fever.  Respiratory: Negative for cough and shortness of breath.   Cardiovascular: Negative for chest pain.  Gastrointestinal: Positive for abdominal distention, abdominal pain, diarrhea, nausea and vomiting.  Musculoskeletal: Positive for back pain.  Skin: Negative for rash.  Neurological: Negative for syncope and headaches.  Psychiatric/Behavioral: Negative for confusion.    Vital Signs: BP 137/70    Pulse 98    Temp 99.6 F (37.6 C) (Oral)    Resp 20    Ht 5' 8.5" (1.74 m)    Wt 295 lb (133.8 kg)    SpO2 90%    BMI 44.20 kg/m   Physical Exam Vitals signs and nursing note reviewed.  Constitutional:      Comments: Appears uncomfortable, pleasant, good historian.  HENT:     Head: Normocephalic.  Cardiovascular:     Rate and Rhythm: Normal rate and regular rhythm.  Pulmonary:     Effort: Pulmonary effort is normal.     Breath sounds: Normal breath sounds.  Abdominal:     General: There is distension.     Tenderness: There is abdominal tenderness (LLQ, RLQ).  Skin:    General: Skin is warm and dry.  Neurological:     Mental Status: She is alert and oriented to person, place, and time.  Psychiatric:        Mood and Affect: Mood normal.        Behavior: Behavior normal.        Thought Content: Thought content normal.        Judgment: Judgment normal.      MD Evaluation Airway: WNL Heart: WNL Abdomen: WNL Chest/ Lungs: WNL ASA  Classification: 3 Mallampati/Airway Score:  One   Imaging: Ct Abdomen Pelvis W Contrast  Result Date: 07/18/2018 CLINICAL DATA:  Abdominal pain and nausea and vomiting for 10 days. Perforated appendicitis with abscess. EXAM: CT ABDOMEN AND PELVIS WITH CONTRAST TECHNIQUE: Multidetector CT imaging of the abdomen and pelvis was performed using the standard protocol following bolus administration of intravenous contrast. CONTRAST:  118mL OMNIPAQUE IOHEXOL 300 MG/ML  SOLN COMPARISON:  07/13/2018 FINDINGS: Lower Chest: No acute findings. Hepatobiliary: No hepatic masses identified. Small right hepatic lobe cysts remains stable. Gallbladder is unremarkable. Pancreas:  No mass or inflammatory changes. Spleen: Within normal limits in size and appearance. Adrenals/Urinary Tract: No masses identified. Small right renal calculi again seen no evidence of ureteral calculi or hydronephrosis. Stomach/Bowel: Gastric lap band  again seen in place. Increased size of fluid and gas collection in right lower quadrant with surrounding inflammatory changes. This measures 9.5 x 6.6 cm, compared to 4.7 x 4.2 cm previously. Associated wall thickening of the cecum and ascending colon is again demonstrated. These findings are consistent with ruptured appendicitis with enlarging periappendiceal abscess. Vascular/Lymphatic: No pathologically enlarged lymph nodes. No abdominal aortic aneurysm. Reproductive: Stable small uterine fibroids measuring up to 2 cm. No adnexal mass or free pelvic fluid. Other:  None. Musculoskeletal:  No suspicious bone lesions identified. IMPRESSION: 1. Ruptured appendicitis, with increased size of periappendiceal abscess no measuring 9.5 cm 2. Stable right nephrolithiasis, without ureteral calculi or hydronephrosis. 3. Stable small uterine fibroids. Electronically Signed   By: Danae OrleansJohn A Stahl M.D.   On: 07/18/2018 18:23   Ct Abdomen Pelvis W Contrast  Result Date: 07/13/2018 CLINICAL DATA:  Perforated appendicitis.  Re-evaluate for abscess. EXAM: CT ABDOMEN  AND PELVIS WITH CONTRAST TECHNIQUE: Multidetector CT imaging of the abdomen and pelvis was performed using the standard protocol following bolus administration of intravenous contrast. CONTRAST:  100mL OMNIPAQUE IOHEXOL 300 MG/ML  SOLN COMPARISON:  CT abdomen pelvis dated July 09, 2018. FINDINGS: Lower chest: Minimal bilateral lower lobe subsegmental atelectasis. Hepatobiliary: Unchanged small simple cyst in the right hepatic lobe. No new focal liver abnormality. The gallbladder is unremarkable. No biliary dilatation. Pancreas: Unremarkable. No pancreatic ductal dilatation or surrounding inflammatory changes. Spleen: Normal in size without focal abnormality. Adrenals/Urinary Tract: The adrenal glands and left kidney are unremarkable. Unchanged nonobstructive calculi in the upper pole of the right kidney measuring up to 6 mm. No hydronephrosis. The bladder is under distended. Stomach/Bowel: Perforated appendicitis again identified with appendicolith at the base of the appendix. Inflammatory changes around the appendix have increased. The amount of extraluminal air adjacent to the appendix is slightly increased in size, now measuring 2.7 x 2.5 x 3.4 cm, previously 2.0 x 0.8 x 1.8 cm. 1.8 x 1.5 cm gas and fluid collection anterior to the cecum has slightly increased in size, previously 1.2 x 0.5 cm. Increased wall thickening of the cecum. Prior gastric banding with unchanged small hiatal hernia. No obstruction. Vascular/Lymphatic: No significant vascular findings are present. No enlarged abdominal or pelvic lymph nodes. Reproductive: Unchanged small uterine fibroids.  No adnexal mass. Other: No abdominal wall hernia or abnormality. No abdominopelvic ascites. Musculoskeletal: No acute or significant osseous findings. IMPRESSION: 1. Perforated appendicitis again seen with interval increase in the loculated extraluminal air adjacent to the appendix, now measuring 2.7 x 2.5 x 3.4 cm, previously 2.0 x 0.8 x 1.8 cm.  Similarly, there is a small 1.8 x 1.5 cm gas and fluid collection anterior to the cecum that previously measured 1.2 x 0.5 cm, concerning for developing abscess. These both likely remain too small for percutaneous drainage. 2. Unchanged nonobstructive right nephrolithiasis. Electronically Signed   By: Obie DredgeWilliam T Derry M.D.   On: 07/13/2018 16:45   Ct Abdomen Pelvis W Contrast  Result Date: 07/09/2018 CLINICAL DATA:  Abdominal pain EXAM: CT ABDOMEN AND PELVIS WITH CONTRAST TECHNIQUE: Multidetector CT imaging of the abdomen and pelvis was performed using the standard protocol following bolus administration of intravenous contrast. CONTRAST:  100mL OMNIPAQUE IOHEXOL 300 MG/ML  SOLN COMPARISON:  None. FINDINGS: Lower chest: No acute abnormality. Hepatobiliary: No focal liver abnormality is seen. No gallstones, gallbladder wall thickening, or biliary dilatation. Pancreas: Unremarkable. No pancreatic ductal dilatation or surrounding inflammatory changes. Spleen: Normal in size without focal abnormality. Adrenals/Urinary Tract: Adrenal glands are unremarkable. Kidneys  are normal, without renal calculi, focal lesion, or hydronephrosis. Bladder is unremarkable. Stomach/Bowel: Prior gastric banding in satisfactory position. Small hiatal hernia. No bowel dilatation to suggest obstruction. Dilated appendix measuring 14 mm with severe surrounding inflammatory changes most consistent with acute appendicitis. There are a few locules air outside of the appendix collectively measuring approximately 18 mm, but adjacent to the appendix most concerning for perforated acute appendicitis. No drainable fluid collection. No pneumatosis, pneumoperitoneum or portal venous gas. Vascular/Lymphatic: No significant vascular findings are present. No enlarged abdominal or pelvic lymph nodes. Reproductive: Uterus and bilateral adnexa are unremarkable. Dystrophic calcification in the uterine fundus likely reflecting a small degenerated fibroid.  Other: No abdominal wall hernia or abnormality. No abdominopelvic ascites. Musculoskeletal: No acute osseous abnormality. No aggressive osseous lesion. Bilateral facet arthropathy at L4-5 and L5-S1. IMPRESSION: 1. Dilated appendix with severe periappendiceal inflammatory changes and a few locules of air outside of, but adjacent to, the appendix most concerning for acute perforated appendicitis. Perforation appears contained and there is no drainable fluid collection to suggest an abscess at this time. Electronically Signed   By: Elige Ko   On: 07/09/2018 16:25   Dg Chest Portable 1 View  Result Date: 07/18/2018 CLINICAL DATA:  Fever EXAM: PORTABLE CHEST 1 VIEW COMPARISON:  None. FINDINGS: Cardiomegaly. Pulmonary vascular prominence without focal airspace opacity or overt edema. The visualized skeletal structures are unremarkable. IMPRESSION: Cardiomegaly. Pulmonary vascular prominence without focal airspace opacity or overt edema. Electronically Signed   By: Lauralyn Primes M.D.   On: 07/18/2018 17:23    Labs:  CBC: Recent Labs    07/15/18 0244 07/16/18 0254 07/18/18 1500 07/19/18 0334  WBC 14.6* 11.3* 21.3* 23.2*  HGB 11.0* 10.7* 12.2 11.2*  HCT 33.9* 32.8* 38.0 34.0*  PLT 208 229 326 309    COAGS: Recent Labs    07/19/18 0818  INR 1.2    BMP: Recent Labs    07/12/18 0402 07/14/18 0338 07/18/18 1500 07/19/18 0334  NA 141 138 138 137  K 3.4* 3.5 3.3* 3.2*  CL 110 107 102 105  CO2 GLUCOSE 135* 106* 137* 109*  BUN 13 6* <5* 5*  CALCIUM 8.5* 8.4* 9.3 8.3*  CREATININE 0.84 0.79 0.91 0.84  GFRNONAA >60 >60 >60 >60  GFRAA >60 >60 >60 >60    LIVER FUNCTION TESTS: Recent Labs    07/09/18 1348 07/10/18 0257 07/18/18 1500 07/19/18 0334  BILITOT 1.6* 1.9* 0.8 0.7  AST 19 21 49* 29  ALT 19 15 46* 35  ALKPHOS 77 73 80 72  PROT 7.3 6.6 7.5 6.4*  ALBUMIN 3.7 3.1* 2.9* 2.4*    TUMOR MARKERS: No results for input(s): AFPTM, CEA, CA199, CHROMGRNA in the  last 8760 hours.  Assessment and Plan:  68 y/o F with known perforated appendicitis who was treated earlier this month with bowel rest and antibiotics who returned to [facility] ED last night due to worsening abdominal pain, nausea, vomiting and diarrhea. Repeat imaging shows periappendiceal collection which has increased from 6/22 - request made to IR for percutaneous drainage of this collection. Patient reviewed with Dr. Loreta Ave today who approves patient for procedure.  Patient has been NPO today and I will place orders for NPO at midnight as well, she received Lovenox this morning which precludes IR from proceeding with placement today - I have held tomorrow's scheduled Lovenox in anticipation of procedure. Tmax 100.2, WBC 23.2, hgb 11.2, plt 309, INR 1.2.  Risks and benefits discussed  with the patient including bleeding, infection, damage to adjacent structures, bowel perforation/fistula connection, and sepsis.  All of the patient's questions were answered, patient is agreeable to proceed.  Consent signed and in chart.  Thank you for this interesting consult.  I greatly enjoyed meeting Julie Richards and look forward to participating in their care.  A copy of this report was sent to the requesting provider on this date.  Electronically Signed: Villa HerbShannon A Jacque Garrels, PA-C 07/19/2018, 12:58 PM   I spent a total of 40 Minutes in face to face in clinical consultation, greater than 50% of which was counseling/coordinating care for abdominal abscess drain placement.

## 2018-07-19 NOTE — Progress Notes (Signed)
Subjective: Awake.  Sitting in chair.  A little lethargic.  Recent IV narcotics.  Says she has no pain at this time.  Does not look toxic Await IR drainage of pelvic abscess. Temp 99.6.  BP 130/70.  Heart rate 98.  Objective: Vital signs in last 24 hours: Temp:  [99.1 F (37.3 C)-100.2 F (37.9 C)] 99.6 F (37.6 C) (06/27 2227) Pulse Rate:  [95-108] 98 (06/27 2227) Resp:  [20-28] 20 (06/27 2227) BP: (134-167)/(70-97) 137/70 (06/27 2227) SpO2:  [90 %-97 %] 90 % (06/27 2200) Weight:  [133.8 kg] 133.8 kg (06/27 1455) Last BM Date: 07/18/18  Intake/Output from previous day: 06/27 0701 - 06/28 0700 In: 1598.7 [I.V.:1298.7; IV Piggyback:300] Out: 300 [Urine:300] Intake/Output this shift: No intake/output data recorded.  General appearance: Awake sitting in chair.  A little lethargic.  Recent analgesics given.  Appropriate Resp: clear to auscultation bilaterally GI: Mild obesity.  Soft.  Tender with guarding right lower quadrant.  I do not appreciate a mass. Extremities: extremities normal, atraumatic, no cyanosis or edema  Lab Results:  Recent Labs    07/18/18 1500 07/19/18 0334  WBC 21.3* 23.2*  HGB 12.2 11.2*  HCT 38.0 34.0*  PLT 326 309   BMET Recent Labs    07/18/18 1500 07/19/18 0334  NA 138 137  K 3.3* 3.2*  CL 102 105  CO2 22 23  GLUCOSE 137* 109*  BUN <5* 5*  CREATININE 0.91 0.84  CALCIUM 9.3 8.3*   PT/INR Recent Labs    07/19/18 0818  LABPROT 15.0  INR 1.2   ABG No results for input(s): PHART, HCO3 in the last 72 hours.  Invalid input(s): PCO2, PO2  Studies/Results: Ct Abdomen Pelvis W Contrast  Result Date: 07/18/2018 CLINICAL DATA:  Abdominal pain and nausea and vomiting for 10 days. Perforated appendicitis with abscess. EXAM: CT ABDOMEN AND PELVIS WITH CONTRAST TECHNIQUE: Multidetector CT imaging of the abdomen and pelvis was performed using the standard protocol following bolus administration of intravenous contrast. CONTRAST:  111mL  OMNIPAQUE IOHEXOL 300 MG/ML  SOLN COMPARISON:  07/13/2018 FINDINGS: Lower Chest: No acute findings. Hepatobiliary: No hepatic masses identified. Small right hepatic lobe cysts remains stable. Gallbladder is unremarkable. Pancreas:  No mass or inflammatory changes. Spleen: Within normal limits in size and appearance. Adrenals/Urinary Tract: No masses identified. Small right renal calculi again seen no evidence of ureteral calculi or hydronephrosis. Stomach/Bowel: Gastric lap band again seen in place. Increased size of fluid and gas collection in right lower quadrant with surrounding inflammatory changes. This measures 9.5 x 6.6 cm, compared to 4.7 x 4.2 cm previously. Associated wall thickening of the cecum and ascending colon is again demonstrated. These findings are consistent with ruptured appendicitis with enlarging periappendiceal abscess. Vascular/Lymphatic: No pathologically enlarged lymph nodes. No abdominal aortic aneurysm. Reproductive: Stable small uterine fibroids measuring up to 2 cm. No adnexal mass or free pelvic fluid. Other:  None. Musculoskeletal:  No suspicious bone lesions identified. IMPRESSION: 1. Ruptured appendicitis, with increased size of periappendiceal abscess no measuring 9.5 cm 2. Stable right nephrolithiasis, without ureteral calculi or hydronephrosis. 3. Stable small uterine fibroids. Electronically Signed   By: Marlaine Hind M.D.   On: 07/18/2018 18:23   Dg Chest Portable 1 View  Result Date: 07/18/2018 CLINICAL DATA:  Fever EXAM: PORTABLE CHEST 1 VIEW COMPARISON:  None. FINDINGS: Cardiomegaly. Pulmonary vascular prominence without focal airspace opacity or overt edema. The visualized skeletal structures are unremarkable. IMPRESSION: Cardiomegaly. Pulmonary vascular prominence without focal airspace opacity or overt edema.  Electronically Signed   By: Lauralyn PrimesAlex  Bibbey M.D.   On: 07/18/2018 17:23    Anti-infectives: Anti-infectives (From admission, onward)   Start     Dose/Rate  Route Frequency Ordered Stop   07/19/18 0200  ceFEPIme (MAXIPIME) 2 g in sodium chloride 0.9 % 100 mL IVPB     2 g 200 mL/hr over 30 Minutes Intravenous Every 8 hours 07/18/18 1602     07/19/18 0200  metroNIDAZOLE (FLAGYL) IVPB 500 mg     500 mg 100 mL/hr over 60 Minutes Intravenous Every 8 hours 07/18/18 2110     07/18/18 2115  ceFEPIme (MAXIPIME) 2 g in sodium chloride 0.9 % 100 mL IVPB  Status:  Discontinued     2 g 200 mL/hr over 30 Minutes Intravenous Every 8 hours 07/18/18 2110 07/18/18 2219   07/18/18 1530  ceFEPIme (MAXIPIME) 2 g in sodium chloride 0.9 % 100 mL IVPB     2 g 200 mL/hr over 30 Minutes Intravenous  Once 07/18/18 1523 07/18/18 1838   07/18/18 1530  metroNIDAZOLE (FLAGYL) IVPB 500 mg     500 mg 100 mL/hr over 60 Minutes Intravenous  Once 07/18/18 1523 07/18/18 2104      Assessment/Plan:  Pelvic abscess.  9.5 cm.  This is related to her perforated appendicitis -Proceed with IR drainage of pelvic abscess today. -IV antibiotics.  Flagyl and Maxipime -N.p.o. until radiology procedure completed, then can advance diet as tolerated -Lovenox for DVT prophylaxis. -Labs ordered for tomorrow   LOS: 1 day    Ernestene MentionHaywood M Galo Sayed 07/19/2018

## 2018-07-20 ENCOUNTER — Inpatient Hospital Stay (HOSPITAL_COMMUNITY): Payer: Medicare Other

## 2018-07-20 LAB — COMPREHENSIVE METABOLIC PANEL
ALT: 26 U/L (ref 0–44)
AST: 22 U/L (ref 15–41)
Albumin: 2.3 g/dL — ABNORMAL LOW (ref 3.5–5.0)
Alkaline Phosphatase: 68 U/L (ref 38–126)
Anion gap: 9 (ref 5–15)
BUN: 9 mg/dL (ref 8–23)
CO2: 27 mmol/L (ref 22–32)
Calcium: 8.5 mg/dL — ABNORMAL LOW (ref 8.9–10.3)
Chloride: 102 mmol/L (ref 98–111)
Creatinine, Ser: 0.82 mg/dL (ref 0.44–1.00)
GFR calc Af Amer: >60 mL/min (ref >60)
GFR calc non Af Amer: >60 mL/min (ref >60)
Glucose, Bld: 101 mg/dL — ABNORMAL HIGH (ref 70–99)
Potassium: 3.8 mmol/L (ref 3.5–5.1)
Sodium: 138 mmol/L (ref 135–145)
Total Bilirubin: 1 mg/dL (ref 0.3–1.2)
Total Protein: 6.5 g/dL (ref 6.5–8.1)

## 2018-07-20 LAB — CBC
HCT: 35.3 % — ABNORMAL LOW (ref 36.0–46.0)
Hemoglobin: 11.6 g/dL — ABNORMAL LOW (ref 12.0–15.0)
MCH: 27.6 pg (ref 26.0–34.0)
MCHC: 32.9 g/dL (ref 30.0–36.0)
MCV: 83.8 fL (ref 80.0–100.0)
Platelets: 345 10*3/uL (ref 150–400)
RBC: 4.21 MIL/uL (ref 3.87–5.11)
RDW: 14.2 % (ref 11.5–15.5)
WBC: 18.2 10*3/uL — ABNORMAL HIGH (ref 4.0–10.5)
nRBC: 0 % (ref 0.0–0.2)

## 2018-07-20 MED ORDER — SODIUM CHLORIDE 0.9% FLUSH
5.0000 mL | Freq: Three times a day (TID) | INTRAVENOUS | Status: DC
  Administered 2018-07-20 – 2018-07-27 (×19): 5 mL

## 2018-07-20 MED ORDER — FENTANYL CITRATE (PF) 100 MCG/2ML IJ SOLN
INTRAMUSCULAR | Status: AC | PRN
  Administered 2018-07-20: 25 ug via INTRAVENOUS
  Administered 2018-07-20: 50 ug via INTRAVENOUS
  Administered 2018-07-20: 25 ug via INTRAVENOUS

## 2018-07-20 MED ORDER — SODIUM CHLORIDE 0.9% FLUSH
10.0000 mL | Freq: Two times a day (BID) | INTRAVENOUS | Status: DC
  Administered 2018-07-20: 10 mL

## 2018-07-20 MED ORDER — KCL IN DEXTROSE-NACL 20-5-0.45 MEQ/L-%-% IV SOLN
INTRAVENOUS | Status: DC
  Administered 2018-07-20 – 2018-07-26 (×8): via INTRAVENOUS
  Filled 2018-07-20 (×8): qty 1000

## 2018-07-20 MED ORDER — ENOXAPARIN SODIUM 40 MG/0.4ML ~~LOC~~ SOLN
40.0000 mg | SUBCUTANEOUS | Status: DC
  Administered 2018-07-21 – 2018-07-27 (×7): 40 mg via SUBCUTANEOUS
  Filled 2018-07-20 (×7): qty 0.4

## 2018-07-20 MED ORDER — MIDAZOLAM HCL 2 MG/2ML IJ SOLN
INTRAMUSCULAR | Status: AC | PRN
  Administered 2018-07-20: 0.5 mg via INTRAVENOUS
  Administered 2018-07-20: 1 mg via INTRAVENOUS
  Administered 2018-07-20: 0.5 mg via INTRAVENOUS

## 2018-07-20 MED ORDER — MIDAZOLAM HCL 2 MG/2ML IJ SOLN
INTRAMUSCULAR | Status: AC
  Filled 2018-07-20: qty 2

## 2018-07-20 MED ORDER — FENTANYL CITRATE (PF) 100 MCG/2ML IJ SOLN
INTRAMUSCULAR | Status: AC
  Filled 2018-07-20: qty 2

## 2018-07-20 MED ORDER — SODIUM CHLORIDE 0.9% FLUSH: 10.0000 mL | INTRAVENOUS | Status: DC | PRN

## 2018-07-20 MED ORDER — LIDOCAINE HCL 1 % IJ SOLN
INTRAMUSCULAR | Status: AC
  Filled 2018-07-20: qty 20

## 2018-07-20 NOTE — Progress Notes (Signed)
Patient transported off unit for procedure. 

## 2018-07-20 NOTE — Progress Notes (Signed)
Patient returned to unit from IR procedure.

## 2018-07-20 NOTE — Progress Notes (Signed)
Subjective/Chief Complaint: No flatus or bm, no more nausea or emesis, concerned about not having appendix out and kids not sure what is going on   Objective: Vital signs in last 24 hours: Temp:  [97.7 F (36.5 C)-98.7 F (37.1 C)] 98.7 F (37.1 C) (06/29 0459) Pulse Rate:  [76-88] 88 (06/29 0459) Resp:  [17-20] 17 (06/29 0459) BP: (109-125)/(59-76) 125/70 (06/29 0459) SpO2:  [89 %-95 %] 89 % (06/29 0459) Last BM Date: 07/18/18  Intake/Output from previous day: 06/28 0701 - 06/29 0700 In: 0  Out: 650 [Urine:650] Intake/Output this shift: No intake/output data recorded.  General appearance: Awake and alert, nervous Lungs clear cv rrr GI: soft tender rlq and periumbilical Extremities: no edema Neuro a/o times three  Lab Results:  Recent Labs    07/19/18 0334 07/20/18 0256  WBC 23.2* 18.2*  HGB 11.2* 11.6*  HCT 34.0* 35.3*  PLT 309 345   BMET Recent Labs    07/19/18 0334 07/20/18 0256  NA 137 138  K 3.2* 3.8  CL 105 102  CO2 23 27  GLUCOSE 109* 101*  BUN 5* 9  CREATININE 0.84 0.82  CALCIUM 8.3* 8.5*   PT/INR Recent Labs    07/19/18 0818  LABPROT 15.0  INR 1.2   ABG No results for input(s): PHART, HCO3 in the last 72 hours.  Invalid input(s): PCO2, PO2  Studies/Results: Ct Abdomen Pelvis W Contrast  Result Date: 07/18/2018 CLINICAL DATA:  Abdominal pain and nausea and vomiting for 10 days. Perforated appendicitis with abscess. EXAM: CT ABDOMEN AND PELVIS WITH CONTRAST TECHNIQUE: Multidetector CT imaging of the abdomen and pelvis was performed using the standard protocol following bolus administration of intravenous contrast. CONTRAST:  100mL OMNIPAQUE IOHEXOL 300 MG/ML  SOLN COMPARISON:  07/13/2018 FINDINGS: Lower Chest: No acute findings. Hepatobiliary: No hepatic masses identified. Small right hepatic lobe cysts remains stable. Gallbladder is unremarkable. Pancreas:  No mass or inflammatory changes. Spleen: Within normal limits in size and  appearance. Adrenals/Urinary Tract: No masses identified. Small right renal calculi again seen no evidence of ureteral calculi or hydronephrosis. Stomach/Bowel: Gastric lap band again seen in place. Increased size of fluid and gas collection in right lower quadrant with surrounding inflammatory changes. This measures 9.5 x 6.6 cm, compared to 4.7 x 4.2 cm previously. Associated wall thickening of the cecum and ascending colon is again demonstrated. These findings are consistent with ruptured appendicitis with enlarging periappendiceal abscess. Vascular/Lymphatic: No pathologically enlarged lymph nodes. No abdominal aortic aneurysm. Reproductive: Stable small uterine fibroids measuring up to 2 cm. No adnexal mass or free pelvic fluid. Other:  None. Musculoskeletal:  No suspicious bone lesions identified. IMPRESSION: 1. Ruptured appendicitis, with increased size of periappendiceal abscess no measuring 9.5 cm 2. Stable right nephrolithiasis, without ureteral calculi or hydronephrosis. 3. Stable small uterine fibroids. Electronically Signed   By: Danae OrleansJohn A Stahl M.D.   On: 07/18/2018 18:23   Dg Chest Portable 1 View  Result Date: 07/18/2018 CLINICAL DATA:  Fever EXAM: PORTABLE CHEST 1 VIEW COMPARISON:  None. FINDINGS: Cardiomegaly. Pulmonary vascular prominence without focal airspace opacity or overt edema. The visualized skeletal structures are unremarkable. IMPRESSION: Cardiomegaly. Pulmonary vascular prominence without focal airspace opacity or overt edema. Electronically Signed   By: Lauralyn PrimesAlex  Bibbey M.D.   On: 07/18/2018 17:23    Anti-infectives: Anti-infectives (From admission, onward)   Start     Dose/Rate Route Frequency Ordered Stop   07/19/18 0200  ceFEPIme (MAXIPIME) 2 g in sodium chloride 0.9 % 100 mL IVPB  2 g 200 mL/hr over 30 Minutes Intravenous Every 8 hours 07/18/18 1602     07/19/18 0200  metroNIDAZOLE (FLAGYL) IVPB 500 mg     500 mg 100 mL/hr over 60 Minutes Intravenous Every 8 hours  07/18/18 2110     07/18/18 2115  ceFEPIme (MAXIPIME) 2 g in sodium chloride 0.9 % 100 mL IVPB  Status:  Discontinued     2 g 200 mL/hr over 30 Minutes Intravenous Every 8 hours 07/18/18 2110 07/18/18 2219   07/18/18 1530  ceFEPIme (MAXIPIME) 2 g in sodium chloride 0.9 % 100 mL IVPB     2 g 200 mL/hr over 30 Minutes Intravenous  Once 07/18/18 1523 07/18/18 1838   07/18/18 1530  metroNIDAZOLE (FLAGYL) IVPB 500 mg     500 mg 100 mL/hr over 60 Minutes Intravenous  Once 07/18/18 1523 07/18/18 2104      Assessment/Plan: Pelvic abscess.  9.5 cm secondary to perforated appendicitis -Proceed with IR drainage of pelvic abscess today. -IV antibiotics continue Flagyl and Maxipime -clears after procedure today -Lovenox for DVT prophylaxis. -will discuss with daughter via phone later today   Rolm Bookbinder 07/20/2018

## 2018-07-20 NOTE — Procedures (Signed)
Interventional Radiology Procedure Note  Procedure: CT guided placement of a 69F drain into the perforated appendiceal abscess.  Aspiration of nearly 200 mL thick, foul smelling purulent fluid.   Complications: None  Estimated Blood Loss: None  Recommendations: - Drain to JP - Flush at least once per shift  Signed,  Criselda Peaches, MD

## 2018-07-20 NOTE — Progress Notes (Signed)
Pharmacy Antibiotic Note  Julie Richards is a 68 y.o. female admitted on 07/18/2018 with pelvic abscess secondary to perforated appendicitis. Pharmacy has been consulted for cefepime dosing. Patient also on metronidazole per MD.   Plan:  Continue Cefepime 2g IV q8h  Continue Metrondiazole 500mg  IV q8h per MD.  Dosage of antibiotics remain stable and need for further dosage adjustment appears unlikely at present. Pharmacy will sign off at this time. Please reconsult if a change in clinical status warrants re-evaluation of dosages.  Height: 5' 8.5" (174 cm) Weight: 295 lb (133.8 kg) IBW/kg (Calculated) : 65.05  Temp (24hrs), Avg:98.2 F (36.8 C), Min:97.7 F (36.5 C), Max:98.7 F (37.1 C)  Recent Labs  Lab 07/14/18 0338  07/15/18 0244 07/16/18 0254 07/18/18 1500 07/18/18 1530 07/19/18 0334 07/20/18 0256  WBC  --    < > 14.6* 11.3* 21.3*  --  23.2* 18.2*  CREATININE 0.79  --   --   --  0.91  --  0.84 0.82  LATICACIDVEN  --   --   --   --   --  1.4  --   --    < > = values in this interval not displayed.    Estimated Creatinine Clearance: 100 mL/min (by C-G formula based on SCr of 0.82 mg/dL).    Allergies  Allergen Reactions  . Percocet [Oxycodone-Acetaminophen] Nausea And Vomiting    Antimicrobials this admission: Cefepime 6/27 >> Flagyl 6/27 >>  Dose adjustments this admission: N/A  Microbiology results: 6/27 BCx: NGTD 6/27 COVID: negative   Thank you for allowing pharmacy to be a part of this patient's care.   Lindell Spar, PharmD, BCPS Clinical Pharmacist  07/20/2018 10:26 AM

## 2018-07-20 NOTE — Progress Notes (Signed)
MEDICATION-RELATED CONSULT NOTE   IR Procedure Consult - Anticoagulant/Antiplatelet PTA/Inpatient Med List Review by Pharmacist    Procedure: CT guided placement of a 55F drain into the perforated appendiceal abscess. Aspiration of thick, foul smelling purulent fluid.     Completed: 07/20/2018 at 1058  Post-Procedural bleeding risk per IR MD assessment: Low   Antithrombotic medications on inpatient or PTA profile prior to procedure:  Enoxaparin 40mg  SQ q24h     Recommended restart time per IR Post-Procedure Guidelines: Day 0 (at least 4 hours after procedure)      Plan:     Enoxaparin scheduled to resume 07/21/2018 AM as per surgeon. Will continue as ordered per MD.    Lindell Spar, PharmD, BCPS Clinical Pharmacist  07/20/2018 2:26 PM

## 2018-07-21 LAB — COMPREHENSIVE METABOLIC PANEL
ALT: 19 U/L (ref 0–44)
AST: 12 U/L — ABNORMAL LOW (ref 15–41)
Albumin: 2.1 g/dL — ABNORMAL LOW (ref 3.5–5.0)
Alkaline Phosphatase: 54 U/L (ref 38–126)
Anion gap: 9 (ref 5–15)
BUN: 8 mg/dL (ref 8–23)
CO2: 24 mmol/L (ref 22–32)
Calcium: 8.2 mg/dL — ABNORMAL LOW (ref 8.9–10.3)
Chloride: 103 mmol/L (ref 98–111)
Creatinine, Ser: 0.86 mg/dL (ref 0.44–1.00)
GFR calc Af Amer: >60 mL/min (ref >60)
GFR calc non Af Amer: >60 mL/min (ref >60)
Glucose, Bld: 147 mg/dL — ABNORMAL HIGH (ref 70–99)
Potassium: 3.2 mmol/L — ABNORMAL LOW (ref 3.5–5.1)
Sodium: 136 mmol/L (ref 135–145)
Total Bilirubin: 1.1 mg/dL (ref 0.3–1.2)
Total Protein: 6.2 g/dL — ABNORMAL LOW (ref 6.5–8.1)

## 2018-07-21 LAB — CBC
HCT: 35.7 % — ABNORMAL LOW (ref 36.0–46.0)
Hemoglobin: 11.5 g/dL — ABNORMAL LOW (ref 12.0–15.0)
MCH: 26.6 pg (ref 26.0–34.0)
MCHC: 32.2 g/dL (ref 30.0–36.0)
MCV: 82.6 fL (ref 80.0–100.0)
Platelets: 317 10*3/uL (ref 150–400)
RBC: 4.32 MIL/uL (ref 3.87–5.11)
RDW: 14.1 % (ref 11.5–15.5)
WBC: 14.5 10*3/uL — ABNORMAL HIGH (ref 4.0–10.5)
nRBC: 0 % (ref 0.0–0.2)

## 2018-07-21 MED ORDER — BOOST / RESOURCE BREEZE PO LIQD CUSTOM
1.0000 | Freq: Three times a day (TID) | ORAL | Status: DC
  Administered 2018-07-21 – 2018-07-27 (×12): 1 via ORAL

## 2018-07-21 NOTE — Progress Notes (Signed)
Referring Physician(s): Dr M Wakefield  Supervising Physician: Wagner, Jaime  Patient Status:  [facility] - In-pt  Chief Complaint:  RLQ abscess drain placed in IR 6/29  Subjective:  Tired Less abd pain today Some better  Allergies: Percocet [oxycodone-acetaminophen]  Medications: Prior to Admission medications   Medication Sig Start Date End Date Taking? Authorizing Provider  acetaminophen (TYLENOL) 325 MG tablet Take 2 tablets (650 mg total) by mouth every 6 (six) hours as needed for mild pain. Patient taking differently: Take 325 mg by mouth every 6 (six) hours as needed for mild pain.  07/16/18  Yes Rayburn, Kelly A, PA-C  Cholecalciferol (VITAMIN D3 PO) Take 1 capsule by mouth daily.    Yes [provider]  docusate sodium (COLACE) 100 MG capsule Take 100 mg by mouth daily as needed for mild constipation (bloatin).   Yes [provider]  ibuprofen (ADVIL) 400 MG tablet Take 1 tablet (400 mg total) by mouth every 6 (six) hours as needed for moderate pain. 07/16/18  Yes Rayburn, Kelly A, PA-C  Multiple Vitamin (MULTIVITAMIN WITH MINERALS) TABS tablet Take 1 tablet by mouth daily.   Yes [provider]  OVER THE COUNTER MEDICATION Take 1 capsule by mouth daily. Mega 4    Yes [provider]  traMADol (ULTRAM) 50 MG tablet Take 1-2 tablets (50-100 mg total) by mouth every 6 (six) hours as needed for moderate pain or severe pain. 07/16/18  Yes Rayburn, Kelly A, PA-C     Vital Signs: BP 130/321Ree [id number] Kida440JTriBurgess Me[facility] Ambulator oWillia ngs. Hepatobiliary: No hepatic masses identified. Small right hepatic lobe cysts remains stable. Gallbladder is unremarkable. Pancreas:  No mass or inflammatory changes. Spleen: Within normal limits in size and appearance. Adrenals/Urinary Tract: No masses identified. Small right renal calculi again seen no evidence of ureteral calculi or hydronephrosis. Stomach/Bowel: Gastric lap band again seen in place. Increased size of fluid and gas collection in right lower quadrant with surrounding inflammatory changes. This measures 9.5 x 6.6 cm, compared to 4.7 x 4.2 cm previously. Associated wall thickening of the cecum and ascending colon is again demonstrated. These findings are consistent with ruptured appendicitis with enlarging periappendiceal abscess. Vascular/Lymphatic: No pathologically enlarged lymph nodes. No abdominal aortic aneurysm. Reproductive: Stable small uterine fibroids measuring up to 2 cm. No adnexal mass or free pelvic fluid. Other:  None. Musculoskeletal:  No suspicious bone lesions identified. IMPRESSION: 1. Ruptured appendicitis, with increased  size of periappendiceal abscess no measuring 9.5 cm 2. Stable right nephrolithiasis, without ureteral calculi or hydronephrosis. 3. Stable small uterine fibroids. Electronically Signed   By: Marlaine Hind M.D.   On: 07/18/2018 18:23   Dg Chest Portable 1 View  Result Date: 07/18/2018 CLINICAL DATA:  Fever EXAM: PORTABLE  CHEST 1 VIEW COMPARISON:  None. FINDINGS: Cardiomegaly. Pulmonary vascular prominence without focal airspace opacity or overt edema. The visualized skeletal structures are unremarkable. IMPRESSION: Cardiomegaly. Pulmonary vascular prominence without focal airspace opacity or overt edema. Electronically Signed   By: Eddie Candle M.D.   On: 07/18/2018 17:23   Ct Image Guided Drainage By Percutaneous Catheter  Result Date: 07/20/2018 INDICATION: 68 year old female with ruptured appendicitis and right lower quadrant peri appendiceal abscess. She presents for CT-guided drain placement. EXAM: CT-guided drain placement MEDICATIONS: The patient is currently admitted to the hospital and receiving intravenous antibiotics. The antibiotics were administered within an appropriate time frame prior to the initiation of the procedure. ANESTHESIA/SEDATION: Fentanyl 100 mcg IV; Versed 2 mg IV Moderate Sedation Time:  23 minutes The patient was continuously monitored during the procedure by the interventional radiology nurse under my direct supervision. COMPLICATIONS: None immediate. PROCEDURE: Informed written consent was obtained from the patient after a thorough discussion of the procedural risks, benefits and alternatives. All questions were addressed. A timeout was performed prior to the initiation of the procedure. A planning axial CT scan was performed. The fluid and gas collection in the right lower quadrant adjacent to the appendix was successfully identified. A suitable skin entry site was selected and marked. The overlying skin was sterilely prepped and draped in the standard fashion using chlorhexidine skin prep. Local anesthesia was attained by infiltration with 1% lidocaine. Under intermittent CT guidance, an 18 gauge trocar needle was carefully advanced through the right lower quadrant anterior abdominal wall and into the fluid and gas collection. An Amplatz wire was then coiled within the collection. A small  dermatotomy was made. The skin tract was dilated to 12 [place]. A Cook 12 French drainage catheter was then advanced over the wire and formed in the fluid collection. Aspiration yields nearly 200 mL of thick, foul-smelling purulent fluid. A sample was sent for Gram stain and culture. Additional CT imaging confirms excellent placement of the drainage catheter and near-total resolution of the abscess cavity. The abscess cavity was then lavaged with sterile saline in the drain connected to JP bulb suction. The drain was then secured to the skin with 0 Prolene suture. Sterile bandages were applied. IMPRESSION: Technically successful placement of a 12 French drainage catheter into the right lower quadrant peri-appendiceal abscess yielding approximately 200 mL of thick, foul-smelling purulent fluid. PLAN: 1. Maintain drain to JP bulb suction. 2. Flush at least once per shift. 3. Follow-up in drain clinic with CT scan of the abdomen and pelvis and drain injection in 2 weeks. Signed, Criselda Peaches, MD, [place] [facility] Radiology Electronically Signed   By: Jacqulynn Cadet M.D.   On: 07/20/2018 15:00    Labs:  CBC: Recent Labs    07/16/18 0254 07/18/18 1500 07/19/18 0334 07/20/18 0256  WBC 11.3* 21.3* 23.2* 18.2*  HGB 10.7* 12.2 11.2* 11.6*  HCT 32.8* 38.0 34.0* 35.3*  PLT 229 326 309 345    COAGS: Recent Labs    07/19/18 0818  INR 1.2    BMP: Recent Labs    07/14/18 0338 07/18/18 1500 07/19/18 0334 07/20/18 0256  NA 138 138 137  138  K 3.5 3.3* 3.2* 3.8  CL 107 102 105 102  CO2 22 22 23 27   GLUCOSE 106* 137* 109* 101*  BUN 6* <5* 5* 9  CALCIUM 8.4* 9.3 8.3* 8.5*  CREATININE 0.79 0.91 0.84 0.82  GFRNONAA >60 >60 >60 >60  GFRAA >60 >60 >60 >60    LIVER FUNCTION TESTS: Recent Labs    07/10/18 0257 07/18/18 1500 07/19/18 0334 07/20/18 0256  BILITOT 1.9* 0.8 0.7 1.0  AST 21 49* 29 22  ALT 15 46* 35 26  ALKPHOS 73 80 72 68   PROT 6.6 7.5 6.4* 6.5  ALBUMIN 3.1* 2.9* 2.4* 2.3*    Assessment and Plan:  RLQ abscess drain intact Will follow  Electronically Signed: Robet LeuPamela A Elveta Rape, PA-C 07/21/2018, 7:44 AM   I spent a total of 15 Minutes at the the patient's bedside AND on the patient's hospital floor or unit, greater than 50% of which was counseling/coordinating care for RLQ abscess drain

## 2018-07-21 NOTE — Progress Notes (Addendum)
[facility]/Trauma Progress Note      Assessment/Plan Pelvic abscess. 9.5 cm secondary to perforated appendicitis, previous admission 06/18 - S/P IR drainage of pelvic abscess 06/29 yielding 200cc of purulent fluid, cultures pending.  - continue IV abx - continue CLD until flatus - attempted to call daughter this am with no answer, will try again this afternoon  FEN: CLD VTE: SCD's, lovenox ID: Maxipime & Flagyl 06/27>> Follow up: TBD  DISPO: ambulate, IS, continue drain and IV abx    LOS: 3 days    Subjective: CC: abdominal pain  Pt states pain is slightly better. She denies flatus or BM. No nausea or vomiting, fever or chills overnight. She has not been using her IS and it hurts to take deep breaths. Encouraged ambulation.   Objective: Vital signs in last 24 hours: Temp:  [98.6 F (37 C)-98.9 F (37.2 C)] 98.9 F (37.2 C) (06/30 0431) Pulse Rate:  [82-93] 89 (06/30 0431) Resp:  [18-23] 19 (06/30 0431) BP: (126-151)/(64-97) 130/64 (06/30 0431) SpO2:  [93 %-100 %] 94 % (06/30 0431) Last BM Date: 07/18/18  Intake/Output from previous day: 06/29 0701 - 06/30 0700 In: 2094.5 [P.O.:240; I.V.:1439.5; IV Piggyback:400] Out: 2825 [Urine:2450; Drains:375] Intake/Output this shift: No intake/output data recorded.  PE: Gen:  Alert, NAD, pleasant, cooperative Card:  RRR, no M/G/R heard Pulm:  CTA, no W/R/R, effort normal Abd: Soft, ND, +BS, no HSM, incisions C/D/I, drain with minimal serosanguinous purulent drainage, generalized TTP with guarding worse in the RLQ Skin: no rashes noted, warm and dry   Anti-infectives: Anti-infectives (From admission, onward)   Start     Dose/Rate Route Frequency Ordered Stop   07/19/18 0200  ceFEPIme (MAXIPIME) 2 g in sodium chloride 0.9 % 100 mL IVPB     2 g 200 mL/hr over 30 Minutes Intravenous Every 8 hours 07/18/18 1602     07/19/18 0200  metroNIDAZOLE (FLAGYL) IVPB 500 mg     500 mg 100 mL/hr over 60 Minutes  Intravenous Every 8 hours 07/18/18 2110     07/18/18 2115  ceFEPIme (MAXIPIME) 2 g in sodium chloride 0.9 % 100 mL IVPB  Status:  Discontinued     2 g 200 mL/hr over 30 Minutes Intravenous Every 8 hours 07/18/18 2110 07/18/18 2219   07/18/18 1530  ceFEPIme (MAXIPIME) 2 g in sodium chloride 0.9 % 100 mL IVPB     2 g 200 mL/hr over 30 Minutes Intravenous  Once 07/18/18 1523 07/18/18 1838   07/18/18 1530  metroNIDAZOLE (FLAGYL) IVPB 500 mg     500 mg 100 mL/hr over 60 Minutes Intravenous  Once 07/18/18 1523 07/18/18 2104      Lab Results:  Recent Labs    07/19/18 0334 07/20/18 0256  WBC 23.2* 18.2*  HGB 11.2* 11.6*  HCT 34.0* 35.3*  PLT 309 345   BMET Recent Labs    07/19/18 0334 07/20/18 0256  NA 137 138  K 3.2* 3.8  CL 105 102  CO2 23 27  GLUCOSE 109* 101*  BUN 5* 9  CREATININE 0.84 0.82  CALCIUM 8.3* 8.5*   PT/INR Recent Labs    07/19/18 0818  LABPROT 15.0  INR 1.2   CMP     Component Value Date/Time   NA 138 07/20/2018 0256   K 3.8 07/20/2018 0256   CL 102 07/20/2018 0256   CO2 27 07/20/2018 0256   GLUCOSE 101 (H) 07/20/2018 0256   BUN 9 07/20/2018 0256   CREATININE 0.82 07/20/2018 0256  CALCIUM 8.5 (L) 07/20/2018 0256   PROT 6.5 07/20/2018 0256   ALBUMIN 2.3 (L) 07/20/2018 0256   AST 22 07/20/2018 0256   ALT 26 07/20/2018 0256   ALKPHOS 68 07/20/2018 0256   BILITOT 1.0 07/20/2018 0256   GFRNONAA >60 07/20/2018 0256   GFRAA >60 07/20/2018 0256   Lipase     Component Value Date/Time   LIPASE 40 07/18/2018 1500    Studies/Results: Ct Image Guided Drainage By Percutaneous Catheter  Result Date: 07/20/2018 INDICATION: 68 year old female with ruptured appendicitis and right lower quadrant peri appendiceal abscess. She presents for CT-guided drain placement. EXAM: CT-guided drain placement MEDICATIONS: The patient is currently admitted to the hospital and receiving intravenous antibiotics. The antibiotics were administered within an appropriate  time frame prior to the initiation of the procedure. ANESTHESIA/SEDATION: Fentanyl 100 mcg IV; Versed 2 mg IV Moderate Sedation Time:  23 minutes The patient was continuously monitored during the procedure by the interventional radiology nurse under my direct supervision. COMPLICATIONS: None immediate. PROCEDURE: Informed written consent was obtained from the patient after a thorough discussion of the procedural risks, benefits and alternatives. All questions were addressed. A timeout was performed prior to the initiation of the procedure. A planning axial CT scan was performed. The fluid and gas collection in the right lower quadrant adjacent to the appendix was successfully identified. A suitable skin entry site was selected and marked. The overlying skin was sterilely prepped and draped in the standard fashion using chlorhexidine skin prep. Local anesthesia was attained by infiltration with 1% lidocaine. Under intermittent CT guidance, an 18 gauge trocar needle was carefully advanced through the right lower quadrant anterior abdominal wall and into the fluid and gas collection. An Amplatz wire was then coiled within the collection. A small dermatotomy was made. The skin tract was dilated to 12 [place]. A Cook 12 French drainage catheter was then advanced over the wire and formed in the fluid collection. Aspiration yields nearly 200 mL of thick, foul-smelling purulent fluid. A sample was sent for Gram stain and culture. Additional CT imaging confirms excellent placement of the drainage catheter and near-total resolution of the abscess cavity. The abscess cavity was then lavaged with sterile saline in the drain connected to JP bulb suction. The drain was then secured to the skin with 0 Prolene suture. Sterile bandages were applied. IMPRESSION: Technically successful placement of a 12 French drainage catheter into the right lower quadrant peri-appendiceal abscess yielding approximately 200 mL of thick, foul-smelling  purulent fluid. PLAN: 1. Maintain drain to JP bulb suction. 2. Flush at least once per shift. 3. Follow-up in drain clinic with CT scan of the abdomen and pelvis and drain injection in 2 weeks. Signed, Sterling BigHeath K. McCullough, MD, R[facility] Electronically Signed   By: Malachy MoanHeath  McCullough M.D.   On: 07/20/2018 15:00      Jerre SimonJessica L Focht , [facility] 07/21/2018, 8:40 AM  Pager: [phone] Mon-Wed, Friday 7:00am-4:30pm Thurs 7am-11:30am  Consults: [phone]

## 2018-07-22 LAB — COMPREHENSIVE METABOLIC PANEL
ALT: 15 U/L (ref 0–44)
AST: 12 U/L — ABNORMAL LOW (ref 15–41)
Albumin: 2.2 g/dL — ABNORMAL LOW (ref 3.5–5.0)
Alkaline Phosphatase: 56 U/L (ref 38–126)
Anion gap: 11 (ref 5–15)
BUN: 11 mg/dL (ref 8–23)
CO2: 23 mmol/L (ref 22–32)
Calcium: 8.3 mg/dL — ABNORMAL LOW (ref 8.9–10.3)
Chloride: 102 mmol/L (ref 98–111)
Creatinine, Ser: 0.82 mg/dL (ref 0.44–1.00)
GFR calc Af Amer: >60 mL/min (ref >60)
GFR calc non Af Amer: >60 mL/min (ref >60)
Glucose, Bld: 124 mg/dL — ABNORMAL HIGH (ref 70–99)
Potassium: 3.5 mmol/L (ref 3.5–5.1)
Sodium: 136 mmol/L (ref 135–145)
Total Bilirubin: 0.7 mg/dL (ref 0.3–1.2)
Total Protein: 6.3 g/dL — ABNORMAL LOW (ref 6.5–8.1)

## 2018-07-22 LAB — CBC
HCT: 35.3 % — ABNORMAL LOW (ref 36.0–46.0)
Hemoglobin: 11.6 g/dL — ABNORMAL LOW (ref 12.0–15.0)
MCH: 27.3 pg (ref 26.0–34.0)
MCHC: 32.9 g/dL (ref 30.0–36.0)
MCV: 83.1 fL (ref 80.0–100.0)
Platelets: 316 10*3/uL (ref 150–400)
RBC: 4.25 MIL/uL (ref 3.87–5.11)
RDW: 14.1 % (ref 11.5–15.5)
WBC: 13.1 10*3/uL — ABNORMAL HIGH (ref 4.0–10.5)
nRBC: 0 % (ref 0.0–0.2)

## 2018-07-22 MED ORDER — METRONIDAZOLE 500 MG PO TABS
500.0000 mg | ORAL_TABLET | Freq: Three times a day (TID) | ORAL | Status: DC
  Administered 2018-07-22 – 2018-07-23 (×2): 500 mg via ORAL
  Filled 2018-07-22 (×2): qty 1

## 2018-07-22 MED ORDER — POLYETHYLENE GLYCOL 3350 17 G PO PACK
17.0000 g | PACK | Freq: Every day | ORAL | Status: DC
  Administered 2018-07-22 – 2018-07-27 (×5): 17 g via ORAL
  Filled 2018-07-22 (×6): qty 1

## 2018-07-22 NOTE — Progress Notes (Signed)
[facility]/Trauma Progress Note      Assessment/Plan Pelvic abscess. 9.5 cmsecondary toperforated appendicitis, previous admission 06/18 - S/P IR drainage of pelvic abscess 06/29, cultures pending.  - continueIV abx - continue CLD until flatus - spoke with daughter yesterday, will call again today.  FEN: CLD, miralax VTE: SCD's, lovenox ID: Maxipime & Flagyl 06/27>>  WBC down to 13.1 today Follow up: TBD  DISPO: ambulate, IS, continue drain and IV abx, await return of bowel function.   LOS: 4 days    Subjective: CC: abdominal pain  Pt feels better today. Pain is better today. Pt walked the halls yesterday x 2 and once already today. Nausea overnight but no vomiting. No flatus or BM. Drinking breeze drinks.   Objective: Vital signs in last 24 hours: Temp:  [98.2 F (36.8 C)-99.4 F (37.4 C)] 99.2 F (37.3 C) (07/01 0613) Pulse Rate:  [79-88] 88 (07/01 0613) Resp:  [18-19] 18 (07/01 0613) BP: (106-124)/(66-71) 124/66 (07/01 0613) SpO2:  [96 %-97 %] 96 % (07/01 0613) Last BM Date: 07/18/18  Intake/Output from previous day: 06/30 0701 - 07/01 0700 In: 3320.2 [P.O.:480; I.V.:1391.8; IV Piggyback:1433.4] Out: 1285 [Urine:1200; Drains:85] Intake/Output this shift: No intake/output data recorded.  PE: Gen:  Alert, NAD, pleasant, cooperative Card:  RRR, no M/G/R heard Pulm:  CTA, no W/R/R, effort normal Abd: Soft, ND, +BS, no HSM, incisions C/D/I, drain with minimal serosanguinous purulent drainage, RLQ TTP with guarding, no peritonitis  Extremities: no TTP, swelling or warmth to calves BL Skin: no rashes noted, warm and dry   Anti-infectives: Anti-infectives (From admission, onward)   Start     Dose/Rate Route Frequency Ordered Stop   07/19/18 0200  ceFEPIme (MAXIPIME) 2 g in sodium chloride 0.9 % 100 mL IVPB     2 g 200 mL/hr over 30 Minutes Intravenous Every 8 hours 07/18/18 1602     07/19/18 0200  metroNIDAZOLE (FLAGYL) IVPB 500 mg     500  mg 100 mL/hr over 60 Minutes Intravenous Every 8 hours 07/18/18 2110     07/18/18 2115  ceFEPIme (MAXIPIME) 2 g in sodium chloride 0.9 % 100 mL IVPB  Status:  Discontinued     2 g 200 mL/hr over 30 Minutes Intravenous Every 8 hours 07/18/18 2110 07/18/18 2219   07/18/18 1530  ceFEPIme (MAXIPIME) 2 g in sodium chloride 0.9 % 100 mL IVPB     2 g 200 mL/hr over 30 Minutes Intravenous  Once 07/18/18 1523 07/18/18 1838   07/18/18 1530  metroNIDAZOLE (FLAGYL) IVPB 500 mg     500 mg 100 mL/hr over 60 Minutes Intravenous  Once 07/18/18 1523 07/18/18 2104      Lab Results:  Recent Labs    07/21/18 0818 07/22/18 0156  WBC 14.5* 13.1*  HGB 11.5* 11.6*  HCT 35.7* 35.3*  PLT 317 316   BMET Recent Labs    07/21/18 0818 07/22/18 0156  NA 136 136  K 3.2* 3.5  CL 103 102  CO2 24 23  GLUCOSE 147* 124*  BUN 8 11  CREATININE 0.86 0.82  CALCIUM 8.2* 8.3*   PT/INR No results for input(s): LABPROT, INR in the last 72 hours. CMP     Component Value Date/Time   NA 136 07/22/2018 0156   K 3.5 07/22/2018 0156   CL 102 07/22/2018 0156   CO2 23 07/22/2018 0156   GLUCOSE 124 (H) 07/22/2018 0156   BUN 11 07/22/2018 0156   CREATININE 0.82 07/22/2018 0156   CALCIUM  8.3 (L) 07/22/2018 0156   PROT 6.3 (L) 07/22/2018 0156   ALBUMIN 2.2 (L) 07/22/2018 0156   AST 12 (L) 07/22/2018 0156   ALT 15 07/22/2018 0156   ALKPHOS 56 07/22/2018 0156   BILITOT 0.7 07/22/2018 0156   GFRNONAA >60 07/22/2018 0156   GFRAA >60 07/22/2018 0156   Lipase     Component Value Date/Time   LIPASE 40 07/18/2018 1500    Studies/Results: Ct Image Guided Drainage By Percutaneous Catheter  Result Date: 07/20/2018 INDICATION: 68 year old female with ruptured appendicitis and right lower quadrant peri appendiceal abscess. She presents for CT-guided drain placement. EXAM: CT-guided drain placement MEDICATIONS: The patient is currently admitted to the hospital and receiving intravenous antibiotics. The antibiotics  were administered within an appropriate time frame prior to the initiation of the procedure. ANESTHESIA/SEDATION: Fentanyl 100 mcg IV; Versed 2 mg IV Moderate Sedation Time:  23 minutes The patient was continuously monitored during the procedure by the interventional radiology nurse under my direct supervision. COMPLICATIONS: None immediate. PROCEDURE: Informed written consent was obtained from the patient after a thorough discussion of the procedural risks, benefits and alternatives. All questions were addressed. A timeout was performed prior to the initiation of the procedure. A planning axial CT scan was performed. The fluid and gas collection in the right lower quadrant adjacent to the appendix was successfully identified. A suitable skin entry site was selected and marked. The overlying skin was sterilely prepped and draped in the standard fashion using chlorhexidine skin prep. Local anesthesia was attained by infiltration with 1% lidocaine. Under intermittent CT guidance, an 18 gauge trocar needle was carefully advanced through the right lower quadrant anterior abdominal wall and into the fluid and gas collection. An Amplatz wire was then coiled within the collection. A small dermatotomy was made. The skin tract was dilated to 12 [place]. A Cook 12 French drainage catheter was then advanced over the wire and formed in the fluid collection. Aspiration yields nearly 200 mL of thick, foul-smelling purulent fluid. A sample was sent for Gram stain and culture. Additional CT imaging confirms excellent placement of the drainage catheter and near-total resolution of the abscess cavity. The abscess cavity was then lavaged with sterile saline in the drain connected to JP bulb suction. The drain was then secured to the skin with 0 Prolene suture. Sterile bandages were applied. IMPRESSION: Technically successful placement of a 12 French drainage catheter into the right lower quadrant peri-appendiceal abscess yielding  approximately 200 mL of thick, foul-smelling purulent fluid. PLAN: 1. Maintain drain to JP bulb suction. 2. Flush at least once per shift. 3. Follow-up in drain clinic with CT scan of the abdomen and pelvis and drain injection in 2 weeks. Signed, Sterling BigHeath K. McCullough, MD, RPVI [facility] Electronically Signed   By: Malachy MoanHeath  McCullough M.D.   On: 07/20/2018 15:00      Jerre SimonJessica L Burnett Spray , [facility] 07/22/2018, 9:57 AM  Pager: [phone] Mon-Wed, Friday 7:00am-4:30pm Thurs 7am-11:30am  Consults: [phone]

## 2018-07-22 NOTE — Progress Notes (Signed)
Dr. Georgia Dom notified of Midline infiltration.

## 2018-07-23 ENCOUNTER — Inpatient Hospital Stay: Payer: Self-pay

## 2018-07-23 LAB — CULTURE, BLOOD (ROUTINE X 2)
Culture: NO GROWTH
Culture: NO GROWTH
Special Requests: ADEQUATE
Special Requests: ADEQUATE

## 2018-07-23 LAB — AEROBIC/ANAEROBIC CULTURE W GRAM STAIN (SURGICAL/DEEP WOUND): Special Requests: NORMAL

## 2018-07-23 LAB — CBC
HCT: 35.9 % — ABNORMAL LOW (ref 36.0–46.0)
Hemoglobin: 11.8 g/dL — ABNORMAL LOW (ref 12.0–15.0)
MCH: 26.9 pg (ref 26.0–34.0)
MCHC: 32.9 g/dL (ref 30.0–36.0)
MCV: 81.8 fL (ref 80.0–100.0)
Platelets: 344 10*3/uL (ref 150–400)
RBC: 4.39 MIL/uL (ref 3.87–5.11)
RDW: 13.8 % (ref 11.5–15.5)
WBC: 10.5 10*3/uL (ref 4.0–10.5)
nRBC: 0 % (ref 0.0–0.2)

## 2018-07-23 LAB — COMPREHENSIVE METABOLIC PANEL
ALT: 15 U/L (ref 0–44)
AST: 15 U/L (ref 15–41)
Albumin: 2.2 g/dL — ABNORMAL LOW (ref 3.5–5.0)
Alkaline Phosphatase: 55 U/L (ref 38–126)
Anion gap: 11 (ref 5–15)
BUN: 7 mg/dL — ABNORMAL LOW (ref 8–23)
CO2: 25 mmol/L (ref 22–32)
Calcium: 8.5 mg/dL — ABNORMAL LOW (ref 8.9–10.3)
Chloride: 100 mmol/L (ref 98–111)
Creatinine, Ser: 0.8 mg/dL (ref 0.44–1.00)
GFR calc Af Amer: >60 mL/min (ref >60)
GFR calc non Af Amer: >60 mL/min (ref >60)
Glucose, Bld: 109 mg/dL — ABNORMAL HIGH (ref 70–99)
Potassium: 3.1 mmol/L — ABNORMAL LOW (ref 3.5–5.1)
Sodium: 136 mmol/L (ref 135–145)
Total Bilirubin: 0.4 mg/dL (ref 0.3–1.2)
Total Protein: 6.7 g/dL (ref 6.5–8.1)

## 2018-07-23 MED ORDER — POTASSIUM CHLORIDE 10 MEQ/100ML IV SOLN
10.0000 meq | INTRAVENOUS | Status: AC
  Administered 2018-07-23 (×4): 10 meq via INTRAVENOUS
  Filled 2018-07-23 (×4): qty 100

## 2018-07-23 MED ORDER — BISACODYL 10 MG RE SUPP
10.0000 mg | Freq: Once | RECTAL | Status: AC
  Administered 2018-07-23: 10 mg via RECTAL
  Filled 2018-07-23: qty 1

## 2018-07-23 MED ORDER — SODIUM CHLORIDE 0.9% FLUSH: 10.0000 mL | INTRAVENOUS | Status: DC | PRN

## 2018-07-23 MED ORDER — PIPERACILLIN-TAZOBACTAM 3.375 G IVPB
3.3750 g | Freq: Three times a day (TID) | INTRAVENOUS | Status: DC
  Administered 2018-07-23 – 2018-07-27 (×13): 3.375 g via INTRAVENOUS
  Filled 2018-07-23 (×12): qty 50

## 2018-07-23 MED ORDER — SODIUM CHLORIDE 0.9% FLUSH
10.0000 mL | Freq: Two times a day (BID) | INTRAVENOUS | Status: DC
  Administered 2018-07-23 – 2018-07-27 (×4): 10 mL

## 2018-07-23 MED ORDER — PROMETHAZINE HCL 25 MG PO TABS
12.5000 mg | ORAL_TABLET | Freq: Four times a day (QID) | ORAL | Status: DC | PRN
  Administered 2018-07-23: 12.5 mg via ORAL
  Filled 2018-07-23: qty 1

## 2018-07-23 NOTE — Plan of Care (Signed)
  Problem: Clinical Measurements: Goal: Ability to maintain clinical measurements within normal limits will improve Outcome: Progressing   Problem: Coping: Goal: Level of anxiety will decrease Outcome: Progressing   Problem: Pain Managment: Goal: General experience of comfort will improve Outcome: Progressing   Problem: Safety: Goal: Ability to remain free from injury will improve Outcome: Progressing   

## 2018-07-23 NOTE — Care Management Important Message (Signed)
Important Message  Patient Details  Name: Julie Richards MRN: [id number] Date of Birth: December 11, 1950   Medicare Important Message Given:  Yes     Memory Argue 07/23/2018, 2:26 PM

## 2018-07-23 NOTE — Progress Notes (Signed)
Pt been feeling nauseated due to the smell of the drain. Given PRN zofran at 2343 with minimal relief. Pt vomited x3 with light yellow material. Made Dr. Kieth Brightly aware. Ordered Phenergan 12.5mg  prn q6h ordered and given. Will continue to monitor pt.

## 2018-07-23 NOTE — Progress Notes (Signed)
Peripherally Inserted Central Catheter/Midline Placement  The IV Nurse has discussed with the patient and/or persons authorized to consent for the patient, the purpose of this procedure and the potential benefits and risks involved with this procedure.  The benefits include less needle sticks, lab draws from the catheter, and the patient may be discharged home with the catheter. Risks include, but not limited to, infection, bleeding, blood clot (thrombus formation), and puncture of an artery; nerve damage and irregular heartbeat and possibility to perform a PICC exchange if needed/ordered by physician.  Alternatives to this procedure were also discussed.  Bard Power PICC patient education guide, fact sheet on infection prevention and patient information card has been provided to patient /or left at bedside.    PICC/Midline Placement Documentation  PICC Double Lumen 70/62/37 PICC Left Basilic 46 cm 2 cm (Active)  Indication for Insertion or Continuance of Line Poor Vasculature-patient has had multiple peripheral attempts or PIVs lasting less than 24 hours 07/23/18 1010  Exposed Catheter (cm) 2 cm 07/23/18 1010  Site Assessment Clean;Dry;Intact 07/23/18 1010  Lumen #1 Status Flushed;Saline locked;Blood return noted 07/23/18 1010  Lumen #2 Status Flushed;Saline locked;Blood return noted 07/23/18 1010  Dressing Type Transparent;Securing device 07/23/18 1010  Dressing Status Clean;Dry;Intact;Antimicrobial disc in place 07/23/18 1010  Dressing Intervention New dressing 07/23/18 1010  Dressing Change Due 07/30/18 07/23/18 1010       Enos Fling 07/23/2018, 10:26 AM

## 2018-07-23 NOTE — Progress Notes (Signed)
Pharmacy Antibiotic Note  Julie Richards is a 68 y.o. female admitted on 07/18/2018 with pelvic abscess s/p drainage.  Pharmacy has been consulted for Zosyn dosing.  Plan: Zosyn 3.375 grams iv Q 8 hours - 4 hr infusion Pharmacy to sign off and follow for changes in renal function  Height: 5' 8.5" (174 cm) Weight: 295 lb (133.8 kg) IBW/kg (Calculated) : 65.05  Temp (24hrs), Avg:99.5 F (37.5 C), Min:99.3 F (37.4 C), Max:99.7 F (37.6 C)  Recent Labs  Lab 07/18/18 1530 07/19/18 0334 07/20/18 0256 07/21/18 0818 07/22/18 0156 07/23/18 0530  WBC  --  23.2* 18.2* 14.5* 13.1* 10.5  CREATININE  --  0.84 0.82 0.86 0.82 0.80  LATICACIDVEN 1.4  --   --   --   --   --     Estimated Creatinine Clearance: 102.5 mL/min (by C-G formula based on SCr of 0.8 mg/dL).    Allergies  Allergen Reactions  . Percocet [Oxycodone-Acetaminophen] Nausea And Vomiting    Thank you for allowing pharmacy to be a part of this patient's care.  Tad Moore 07/23/2018 11:38 AM

## 2018-07-23 NOTE — Progress Notes (Signed)
Per day shift RN, IV team recommending PICC line for this pt and day RN made MD on call aware and wanted to hold off until in the morning. Flagyl changed to PO. Will hold maxipime for loss of IV access.

## 2018-07-23 NOTE — Progress Notes (Addendum)
[facility]/Trauma Progress Note      Assessment/Plan Pelvic abscess. 9.5 cmsecondary toperforated appendicitis, previous admission 06/18 -S/PIR drainage of pelvic abscess 06/29, cultures showed acinetobacter - continueIV abx Hypokalemia - IV replenishment  FEN:FLD, miralax, breeze VTE: SCD's, lovenox SA:[id number] & Flagyl 06/27>>  WBC down to 10.5 today Follow up:TBD  DISPO:ambulate, IS, continue drain and IV abx, await return of bowel function. Place PICC, IV K   LOS: 5 days    Subjective: CC: nausea  Pt states her sense of smell is very heightened and it makes her nauseated. She is even sensitive to light. She had small amt of emesis overnight after smelling the drain output and 2/2 post nasal drainage. She has not had flatus. She has been walking. No other vomiting. Overall she is feeling better.   Objective: Vital signs in last 24 hours: Temp:  [99.3 F (37.4 C)-99.7 F (37.6 C)] 99.4 F (37.4 C) (07/02 0324) Pulse Rate:  [79-94] 94 (07/02 0324) Resp:  [18-20] 18 (07/02 0324) BP: (110-139)/(70-75) 126/73 (07/02 0324) SpO2:  [95 %-98 %] 96 % (07/02 0324) Last BM Date: 07/18/18  Intake/Output from previous day: 07/01 0701 - 07/02 0700 In: 710 [P.O.:600; IV Piggyback:100] Out: 95 [Urine:50; Emesis/NG output:25; Drains:20] Intake/Output this shift: No intake/output data recorded.  PE: Gen: Alert, NAD, pleasant, cooperative Card: RRR, no M/G/R heard Pulm: CTA, no W/R/R, effort normal Abd: Soft, ND, +BS, no HSM, incisions C/D/I, drain with minimalserosanguinouspurulentdrainage, RLQ TTP without guarding, no peritonitis  Skin: no rashes noted, warm and dry    Anti-infectives: Anti-infectives (From admission, onward)   Start     Dose/Rate Route Frequency Ordered Stop   07/22/18 1830  metroNIDAZOLE (FLAGYL) tablet 500 mg     500 mg Oral Every 8 hours 07/22/18 1801     07/19/18 0200  ceFEPIme (MAXIPIME) 2 g in sodium chloride 0.9 %  100 mL IVPB     2 g 200 mL/hr over 30 Minutes Intravenous Every 8 hours 07/18/18 1602     07/19/18 0200  metroNIDAZOLE (FLAGYL) IVPB 500 mg  Status:  Discontinued     500 mg 100 mL/hr over 60 Minutes Intravenous Every 8 hours 07/18/18 2110 07/22/18 1801   07/18/18 2115  ceFEPIme (MAXIPIME) 2 g in sodium chloride 0.9 % 100 mL IVPB  Status:  Discontinued     2 g 200 mL/hr over 30 Minutes Intravenous Every 8 hours 07/18/18 2110 07/18/18 2219   07/18/18 1530  ceFEPIme (MAXIPIME) 2 g in sodium chloride 0.9 % 100 mL IVPB     2 g 200 mL/hr over 30 Minutes Intravenous  Once 07/18/18 1523 07/18/18 1838   07/18/18 1530  metroNIDAZOLE (FLAGYL) IVPB 500 mg     500 mg 100 mL/hr over 60 Minutes Intravenous  Once 07/18/18 1523 07/18/18 2104      Lab Results:  Recent Labs    07/22/18 0156 07/23/18 0530  WBC 13.1* 10.5  HGB 11.6* 11.8*  HCT 35.3* 35.9*  PLT 316 344   BMET Recent Labs    07/22/18 0156 07/23/18 0530  NA 136 136  K 3.5 3.1*  CL 102 100  CO2 23 25  GLUCOSE 124* 109*  BUN 11 7*  CREATININE 0.82 0.80  CALCIUM 8.3* 8.5*   PT/INR No results for input(s): LABPROT, INR in the last 72 hours. CMP     Component Value Date/Time   NA 136 07/23/2018 0530   K 3.1 (L) 07/23/2018 0530   CL 100 07/23/2018 0530  CO2 25 07/23/2018 0530   GLUCOSE 109 (H) 07/23/2018 0530   BUN 7 (L) 07/23/2018 0530   CREATININE 0.80 07/23/2018 0530   CALCIUM 8.5 (L) 07/23/2018 0530   PROT 6.7 07/23/2018 0530   ALBUMIN 2.2 (L) 07/23/2018 0530   AST 15 07/23/2018 0530   ALT 15 07/23/2018 0530   ALKPHOS 55 07/23/2018 0530   BILITOT 0.4 07/23/2018 0530   GFRNONAA >60 07/23/2018 0530   GFRAA >60 07/23/2018 0530   Lipase     Component Value Date/Time   LIPASE 40 07/18/2018 1500    Studies/Results: No results found.    Jerre SimonJessica L Pavel Gadd , [facility] 07/23/2018, 8:18 AM  Pager: [phone] Mon-Wed, Friday 7:00am-4:30pm Thurs 7am-11:30am  Consults: [phone]

## 2018-07-23 NOTE — Progress Notes (Signed)
Referring Physician(s): Darnelle SpangleWakefield,M  Supervising Physician: Gilmer MorWagner, Jaime  [facility]  Chief Complaint: abd pain, appendiceal abscess   Subjective: Pt still with RLQ discomfort, nausea, funny/metallic taste and heightened sense of smell; no BM; vomited last night   Allergies: Percocet [oxycodone-acetaminophen]  Medications: Prior to Admission medications   Medication Sig Start Date End Date Taking? Authorizing Provider  acetaminophen (TYLENOL) 325 MG tablet Take 2 tablets (650 mg total) by mouth every 6 (six) hours as needed for mild pain. Patient taking differently: Take 325 mg by mouth every 6 (six) hours as needed for mild pain.  07/16/18  Yes Rayburn, Alphonsus SiasKelly A, PA-C  Cholecalciferol (VITAMIN D3 PO) Take 1 capsule by mouth daily.    Yes [provider]  docusate sodium (COLACE) 100 MG capsule Take 100 mg by mouth daily as needed for mild constipation (bloatin).   Yes [provider]  ibuprofen (ADVIL) 400 MG tablet Take 1 tablet (400 mg total) by mouth every 6 (six) hours as needed for moderate pain. 07/16/18  Yes Rayburn, Alphonsus SiasKelly A, PA-C  Multiple Vitamin (MULTIVITAMIN WITH MINERALS) TABS tablet Take 1 tablet by mouth daily.   Yes [provider]  OVER THE COUNTER MEDICATION Take 1 capsule by mouth daily. Mega 4    Yes [provider]  traMADol (ULTRAM) 50 MG tablet Take 1-2 tablets (50-100 mg total) by mouth every 6 (six) hours as needed for moderate pain or severe pain. 07/16/18  Yes Rayburn, Alphonsus SiasKelly A, PA-C     Vital Signs: BP 126/73 (BP Location: Left Arm)   Pulse 94   Temp 99.4 F (37.4 C) (Oral)   Resp 18   Ht 5' 8.5" (1.74 m)   Wt 295 lb (133.8 kg)   SpO2 96%   BMI 44.20 kg/m   Physical Exam awake but uncomfortable; RLQ drain intact, insertion site ok, tender to palpation; feculent appearing output in drain- 20 cc  Imaging: Ct Image Guided Drainage By Percutaneous Catheter  Result Date: 07/20/2018  INDICATION: 68 year old female with ruptured appendicitis and right lower quadrant peri appendiceal abscess. She presents for CT-guided drain placement. EXAM: CT-guided drain placement MEDICATIONS: The patient is currently admitted to the hospital and receiving intravenous antibiotics. The antibiotics were administered within an appropriate time frame prior to the initiation of the procedure. ANESTHESIA/SEDATION: Fentanyl 100 mcg IV; Versed 2 mg IV Moderate Sedation Time:  23 minutes The patient was continuously monitored during the procedure by the interventional radiology nurse under my direct supervision. COMPLICATIONS: None immediate. PROCEDURE: Informed written consent was obtained from the patient after a thorough discussion of the procedural risks, benefits and alternatives. All questions were addressed. A timeout was performed prior to the initiation of the procedure. A planning axial CT scan was performed. The fluid and gas collection in the right lower quadrant adjacent to the appendix was successfully identified. A suitable skin entry site was selected and marked. The overlying skin was sterilely prepped and draped in the standard fashion using chlorhexidine skin prep. Local anesthesia was attained by infiltration with 1% lidocaine. Under intermittent CT guidance, an 18 gauge trocar needle was carefully advanced through the right lower quadrant anterior abdominal wall and into the fluid and gas collection. An Amplatz wire was then coiled within the collection. A small dermatotomy was made. The skin tract was dilated to 12 [place]. A Cook 12 French drainage catheter was then advanced over the wire and formed in the fluid collection. Aspiration yields nearly 200  mL of thick, foul-smelling purulent fluid. A sample was sent for Gram stain and culture. Additional CT imaging confirms excellent placement of the drainage catheter and near-total resolution of the abscess cavity. The abscess cavity was then lavaged  with sterile saline in the drain connected to JP bulb suction. The drain was then secured to the skin with 0 Prolene suture. Sterile bandages were applied. IMPRESSION: Technically successful placement of a 12 French drainage catheter into the right lower quadrant peri-appendiceal abscess yielding approximately 200 mL of thick, foul-smelling purulent fluid. PLAN: 1. Maintain drain to JP bulb suction. 2. Flush at least once per shift. 3. Follow-up in drain clinic with CT scan of the abdomen and pelvis and drain injection in 2 weeks. Signed, Criselda Peaches, MD, [place] [facility] Electronically Signed   By: Jacqulynn Cadet M.D.   On: 07/20/2018 15:00   [place] Ekg Site Rite  Result Date: 07/23/2018 If Site Rite image not attached, placement could not be confirmed due to current cardiac rhythm.   Labs:  CBC: Recent Labs    07/20/18 0256 07/21/18 0818 07/22/18 0156 07/23/18 0530  WBC 18.2* 14.5* 13.1* 10.5  HGB 11.6* 11.5* 11.6* 11.8*  HCT 35.3* 35.7* 35.3* 35.9*  PLT 345 317 316 344    COAGS: Recent Labs    07/19/18 0818  INR 1.2    BMP: Recent Labs    07/20/18 0256 07/21/18 0818 07/22/18 0156 07/23/18 0530  NA 138 136 136 136  K 3.8 3.2* 3.5 3.1*  CL 102 103 102 100  CO2 27 24 23 25   GLUCOSE 101* 147* 124* 109*  BUN 9 8 11  7*  CALCIUM 8.5* 8.2* 8.3* 8.5*  CREATININE 0.82 0.86 0.82 0.80  GFRNONAA >60 >60 >60 >60  GFRAA >60 >60 >60 >60    LIVER FUNCTION TESTS: Recent Labs    07/20/18 0256 07/21/18 0818 07/22/18 0156 07/23/18 0530  BILITOT 1.0 1.1 0.7 0.4  AST 22 12* 12* 15  ALT 26 19 15 15   ALKPHOS 68 54 56 55  PROT 6.5 6.2* 6.3* 6.7  ALBUMIN 2.3* 2.1* 2.2* 2.2*    Assessment and Plan: Pt with hx ruptured appendicitis/periappendiceal abscess, s/p drain 6/29; temp 99.4; WBC nl; hgb stable; K 3.1- replace; creat nl; drain fluid cx- e coli; cont with drain flushes; will need f/u CT/drain injection within  next 1-2 weeks or sooner if clinical status worsens   Electronically Signed: D. Rowe Robert, PA-C 07/23/2018, 11:21 AM   I spent a total of 15 minutes at the the patient's bedside AND on the patient's hospital floor or unit, greater than 50% of which was counseling/coordinating care for abdominal abscess drain    Patient ID: Julie Richards, female   DOB: 04-10-1950, 68 y.o.   MRN: [id number]

## 2018-07-24 LAB — CBC
HCT: 35.4 % — ABNORMAL LOW (ref 36.0–46.0)
Hemoglobin: 11.6 g/dL — ABNORMAL LOW (ref 12.0–15.0)
MCH: 27.4 pg (ref 26.0–34.0)
MCHC: 32.8 g/dL (ref 30.0–36.0)
MCV: 83.5 fL (ref 80.0–100.0)
Platelets: 317 10*3/uL (ref 150–400)
RBC: 4.24 MIL/uL (ref 3.87–5.11)
RDW: 14.1 % (ref 11.5–15.5)
WBC: 9 10*3/uL (ref 4.0–10.5)
nRBC: 0 % (ref 0.0–0.2)

## 2018-07-24 LAB — BASIC METABOLIC PANEL
Anion gap: 9 (ref 5–15)
BUN: 7 mg/dL — ABNORMAL LOW (ref 8–23)
CO2: 27 mmol/L (ref 22–32)
Calcium: 8.5 mg/dL — ABNORMAL LOW (ref 8.9–10.3)
Chloride: 101 mmol/L (ref 98–111)
Creatinine, Ser: 0.78 mg/dL (ref 0.44–1.00)
GFR calc Af Amer: >60 mL/min (ref >60)
GFR calc non Af Amer: >60 mL/min (ref >60)
Glucose, Bld: 109 mg/dL — ABNORMAL HIGH (ref 70–99)
Potassium: 3.2 mmol/L — ABNORMAL LOW (ref 3.5–5.1)
Sodium: 137 mmol/L (ref 135–145)

## 2018-07-24 LAB — MAGNESIUM: Magnesium: 2 mg/dL (ref 1.7–2.4)

## 2018-07-24 NOTE — Progress Notes (Signed)
Patient ID: Julie Richards, female   DOB: 22-Apr-1950, 68 y.o.   MRN: [id number] [facility] Progress Note:   * No surgery found *  Subjective: Mental status is clear; no new complaints Objective: Vital signs in last 24 hours: Temp:  [98.7 F (37.1 C)-99.3 F (37.4 C)] 99.2 F (37.3 C) (07/03 0528) Pulse Rate:  [69-78] 69 (07/03 0528) Resp:  [20] 20 (07/03 0528) BP: (113-118)/(60-68) 117/62 (07/03 0528) SpO2:  [96 %-97 %] 96 % (07/03 0528)  Intake/Output from previous day: 07/02 0701 - 07/03 0700 In: 1751.2 [P.O.:480; I.V.:666.2; IV Piggyback:600] Out: 980 [Urine:950; Drains:30] Intake/Output this shift: Total I/O In: 16 [P.O.:60] Out: -   Physical Exam: Work of breathing is normal;  Drain in place  Lab Results:  Results for orders placed or performed during the hospital encounter of 07/18/18 (from the past 48 hour(s))  Comprehensive metabolic panel     Status: Abnormal   Collection Time: 07/23/18  5:30 AM  Result Value Ref Range   Sodium 136 135 - 145 mmol/L   Potassium 3.1 (L) 3.5 - 5.1 mmol/L   Chloride 100 98 - 111 mmol/L   CO2 25 22 - 32 mmol/L   Glucose, Bld 109 (H) 70 - 99 mg/dL   BUN 7 (L) 8 - 23 mg/dL   Creatinine, Ser 0.80 0.44 - 1.00 mg/dL   Calcium 8.5 (L) 8.9 - 10.3 mg/dL   Total Protein 6.7 6.5 - 8.1 g/dL   Albumin 2.2 (L) 3.5 - 5.0 g/dL   AST 15 15 - 41 U/L   ALT 15 0 - 44 U/L   Alkaline Phosphatase 55 38 - 126 U/L   Total Bilirubin 0.4 0.3 - 1.2 mg/dL   GFR calc non Af Amer >60 >60 mL/min   GFR calc Af Amer >60 >60 mL/min   Anion gap 11 5 - 15    Comment: Performed at [facility], [address] [place], [place] [address]  CBC     Status: Abnormal   Collection Time: 07/23/18  5:30 AM  Result Value Ref Range   WBC 10.5 4.0 - 10.5 K/uL   RBC 4.39 3.87 - 5.11 MIL/uL   Hemoglobin 11.8 (L) 12.0 - 15.0 g/dL   HCT 35.9 (L) 36.0 - 46.0 %   MCV 81.8 80.0 - 100.0 fL   MCH 26.9 26.0 - 34.0 pg   MCHC 32.9 30.0 - 36.0 g/dL   RDW 13.8  11.5 - 15.5 %   Platelets 344 150 - 400 K/uL   nRBC 0.0 0.0 - 0.2 %    Comment: Performed at [facility], [place] [address] [place], [place] [address]  Basic metabolic panel     Status: Abnormal   Collection Time: 07/24/18  3:40 AM  Result Value Ref Range   Sodium 137 135 - 145 mmol/L   Potassium 3.2 (L) 3.5 - 5.1 mmol/L   Chloride 101 98 - 111 mmol/L   CO2 27 22 - 32 mmol/L   Glucose, Bld 109 (H) 70 - 99 mg/dL   BUN 7 (L) 8 - 23 mg/dL   Creatinine, Ser 0.78 0.44 - 1.00 mg/dL   Calcium 8.5 (L) 8.9 - 10.3 mg/dL   GFR calc non Af Amer >60 >60 mL/min   GFR calc Af Amer >60 >60 mL/min   Anion gap 9 5 - 15    Comment: Performed at [facility], [place] [address] [place], [place] [address]  CBC  Status: Abnormal   Collection Time: 07/24/18  3:40 AM  Result Value Ref Range   WBC 9.0 4.0 - 10.5 K/uL   RBC 4.24 3.87 - 5.11 MIL/uL   Hemoglobin 11.6 (L) 12.0 - 15.0 g/dL   HCT 16.135.4 (L) 09.636.0 - 04.546.0 %   MCV 83.5 80.0 - 100.0 fL   MCH 27.4 26.0 - 34.0 pg   MCHC 32.8 30.0 - 36.0 g/dL   RDW 40.914.1 81.111.5 - 91.415.5 %   Platelets 317 150 - 400 K/uL   nRBC 0.0 0.0 - 0.2 %    Comment: Performed at [facility], [address] [place], [place] [address]  Magnesium     Status: None   Collection Time: 07/24/18  3:40 AM  Result Value Ref Range   Magnesium 2.0 1.7 - 2.4 mg/dL    Comment: Performed at [facility], [address] [place], [place] [address]    Radiology/Results: [place] Ekg Site Rite  Result Date: 07/23/2018 [facility], placement could not be confirmed due to current cardiac rhythm.   Anti-infectives: Anti-infectives (From admission, onward)   Start     Dose/Rate Route Frequency Ordered Stop   07/23/18 1200  piperacillin-tazobactam (ZOSYN) IVPB 3.375 g     3.375 g 12.5 mL/hr over 240 Minutes Intravenous Every 8 hours 07/23/18 1137     07/22/18 1830  metroNIDAZOLE (FLAGYL) tablet 500 mg  Status:  Discontinued     500 mg Oral Every 8 hours  07/22/18 1801 07/23/18 1136   07/19/18 0200  ceFEPIme (MAXIPIME) 2 g in sodium chloride 0.9 % 100 mL IVPB  Status:  Discontinued     2 g 200 mL/hr over 30 Minutes Intravenous Every 8 hours 07/18/18 1602 07/23/18 1136   07/19/18 0200  metroNIDAZOLE (FLAGYL) IVPB 500 mg  Status:  Discontinued     500 mg 100 mL/hr over 60 Minutes Intravenous Every 8 hours 07/18/18 2110 07/22/18 1801   07/18/18 2115  ceFEPIme (MAXIPIME) 2 g in sodium chloride 0.9 % 100 mL IVPB  Status:  Discontinued     2 g 200 mL/hr over 30 Minutes Intravenous Every 8 hours 07/18/18 2110 07/18/18 2219   07/18/18 1530  ceFEPIme (MAXIPIME) 2 g in sodium chloride 0.9 % 100 mL IVPB     2 g 200 mL/hr over 30 Minutes Intravenous  Once 07/18/18 1523 07/18/18 1838   07/18/18 1530  metroNIDAZOLE (FLAGYL) IVPB 500 mg     500 mg 100 mL/hr over 60 Minutes Intravenous  Once 07/18/18 1523 07/18/18 2104      Assessment/Plan: Problem List: Patient Active Problem List   Diagnosis Date Noted  . Abscess of abdominal cavity (HCC) 07/18/2018  . Perforated appendicitis 07/09/2018    Low grade fever;  Had bm with suppository;  Perforated appy with drain.  Continue IV abx and observation * No surgery found *    LOS: 6 days   Matt B. Daphine DeutscherMartin, MD, [facility]  [facility] [phone] beeper [phone]  07/24/2018 10:00 AM

## 2018-07-25 ENCOUNTER — Encounter (HOSPITAL_COMMUNITY): Payer: Self-pay | Admitting: Radiology

## 2018-07-25 ENCOUNTER — Inpatient Hospital Stay (HOSPITAL_COMMUNITY): Payer: Medicare Other

## 2018-07-25 LAB — CREATININE, SERUM
Creatinine, Ser: 0.74 mg/dL (ref 0.44–1.00)
GFR calc Af Amer: >60 mL/min
GFR calc non Af Amer: >60 mL/min

## 2018-07-25 MED ORDER — SALINE SPRAY 0.65 % NA SOLN
1.0000 | NASAL | Status: DC | PRN
  Filled 2018-07-25: qty 44

## 2018-07-25 MED ORDER — IOHEXOL 300 MG/ML  SOLN
100.0000 mL | Freq: Once | INTRAMUSCULAR | Status: AC | PRN
  Administered 2018-07-25: 100 mL via INTRAVENOUS

## 2018-07-25 NOTE — Progress Notes (Signed)
Patient ID: Julie Richards, female   DOB: 1950-05-20, 68 y.o.   MRN: [id number] [facility] Progress Note:   * No surgery found *  Subjective: Mental status is clear Objective: Vital signs in last 24 hours: Temp:  [98.2 F (36.8 C)-98.7 F (37.1 C)] 98.7 F (37.1 C) (07/04 0425) Pulse Rate:  [75-91] 75 (07/04 0425) Resp:  [18-20] 18 (07/04 0425) BP: (102-134)/(54-75) 134/54 (07/04 0425) SpO2:  [95 %-97 %] 97 % (07/04 0425)  Intake/Output from previous day: 07/03 0701 - 07/04 0700 In: 966.2 [P.O.:210; I.V.:596.2; IV Piggyback:150] Out: 420 [Urine:400; Drains:20] Intake/Output this shift: No intake/output data recorded.  Physical Exam: Work of breathing is normal.  Not having any abdominal pain; main complaint is post nasal drip  Lab Results:  Results for orders placed or performed during the hospital encounter of 07/18/18 (from the past 48 hour(s))  Basic metabolic panel     Status: Abnormal   Collection Time: 07/24/18  3:40 AM  Result Value Ref Range   Sodium 137 135 - 145 mmol/L   Potassium 3.2 (L) 3.5 - 5.1 mmol/L   Chloride 101 98 - 111 mmol/L   CO2 27 22 - 32 mmol/L   Glucose, Bld 109 (H) 70 - 99 mg/dL   BUN 7 (L) 8 - 23 mg/dL   Creatinine, Ser 0.78 0.44 - 1.00 mg/dL   Calcium 8.5 (L) 8.9 - 10.3 mg/dL   GFR calc non Af Amer >60 >60 mL/min   GFR calc Af Amer >60 >60 mL/min   Anion gap 9 5 - 15    Comment: Performed at [facility], [place] [address], [place], [place] [address]  CBC     Status: Abnormal   Collection Time: 07/24/18  3:40 AM  Result Value Ref Range   WBC 9.0 4.0 - 10.5 K/uL   RBC 4.24 3.87 - 5.11 MIL/uL   Hemoglobin 11.6 (L) 12.0 - 15.0 g/dL   HCT 35.4 (L) 36.0 - 46.0 %   MCV 83.5 80.0 - 100.0 fL   MCH 27.4 26.0 - 34.0 pg   MCHC 32.8 30.0 - 36.0 g/dL   RDW 14.1 11.5 - 15.5 %   Platelets 317 150 - 400 K/uL   nRBC 0.0 0.0 - 0.2 %    Comment: Performed at [facility], [place] [address], [place], [place] [address]  Magnesium      Status: None   Collection Time: 07/24/18  3:40 AM  Result Value Ref Range   Magnesium 2.0 1.7 - 2.4 mg/dL    Comment: Performed at [place] [address], [place], [place] [address]  Creatinine, serum     Status: None   Collection Time: 07/25/18  3:36 AM  Result Value Ref Range   Creatinine, Ser 0.74 0.44 - 1.00 mg/dL   GFR calc non Af Amer >60 >60 mL/min   GFR calc Af Amer >60 >60 mL/min    Comment: Performed at [place] [address] [place], [place] [address]    Radiology/Results: No results found.  Anti-infectives: Anti-infectives (From admission, onward)   Start     Dose/Rate Route Frequency Ordered Stop   07/23/18 1200  piperacillin-tazobactam (ZOSYN) IVPB 3.375 g     3.375 g 12.5 mL/hr over 240 Minutes Intravenous Every 8 hours 07/23/18 1137     07/22/18 1830  metroNIDAZOLE (FLAGYL) tablet 500 mg  Status:  Discontinued     500 mg Oral Every 8 hours 07/22/18  1801 07/23/18 1136   07/19/18 0200  ceFEPIme (MAXIPIME) 2 g in sodium chloride 0.9 % 100 mL IVPB  Status:  Discontinued     2 g 200 mL/hr over 30 Minutes Intravenous Every 8 hours 07/18/18 1602 07/23/18 1136   07/19/18 0200  metroNIDAZOLE (FLAGYL) IVPB 500 mg  Status:  Discontinued     500 mg 100 mL/hr over 60 Minutes Intravenous Every 8 hours 07/18/18 2110 07/22/18 1801   07/18/18 2115  ceFEPIme (MAXIPIME) 2 g in sodium chloride 0.9 % 100 mL IVPB  Status:  Discontinued     2 g 200 mL/hr over 30 Minutes Intravenous Every 8 hours 07/18/18 2110 07/18/18 2219   07/18/18 1530  ceFEPIme (MAXIPIME) 2 g in sodium chloride 0.9 % 100 mL IVPB     2 g 200 mL/hr over 30 Minutes Intravenous  Once 07/18/18 1523 07/18/18 1838   07/18/18 1530  metroNIDAZOLE (FLAGYL) IVPB 500 mg     500 mg 100 mL/hr over 60 Minutes Intravenous  Once 07/18/18 1523 07/18/18 2104      Assessment/Plan: Problem List: Patient Active Problem List   Diagnosis Date Noted  . Abscess of abdominal cavity (HCC) 07/18/2018  .  Perforated appendicitis 07/09/2018    Will repeat CT abdomen to assess success of drain * No surgery found *    LOS: 7 days   Matt B. Daphine DeutscherMartin, MD, [facility]  [facility] [phone] beeper [phone]  07/25/2018 9:11 AM

## 2018-07-25 NOTE — Plan of Care (Signed)

## 2018-07-25 NOTE — Plan of Care (Signed)
  Problem: Education: Goal: Knowledge of General Education information will improve Description Including pain rating scale, medication(s)/side effects and non-pharmacologic comfort measures Outcome: Progressing   

## 2018-07-26 NOTE — Progress Notes (Signed)
Referring Physician(s): * No referring provider recorded for this case *  Supervising Physician: Jacqulynn Cadet  Patient Status:  [facility] - In-pt  Chief Complaint: Perforated appendicitis  Subjective: Patient states she is feeling better.  Thankful for care.  Allergies: Percocet [oxycodone-acetaminophen]  Medications: Prior to Admission medications   Medication Sig Start Date End Date Taking? Authorizing Provider  acetaminophen (TYLENOL) 325 MG tablet Take 2 tablets (650 mg total) by mouth every 6 (six) hours as needed for mild pain. Patient taking differently: Take 325 mg by mouth every 6 (six) hours as needed for mild pain.  07/16/18  Yes Rayburn, Floyce Stakes, PA-C  Cholecalciferol (VITAMIN D3 PO) Take 1 capsule by mouth daily.    Yes [provider]  docusate sodium (COLACE) 100 MG capsule Take 100 mg by mouth daily as needed for mild constipation (bloatin).   Yes [provider]  ibuprofen (ADVIL) 400 MG tablet Take 1 tablet (400 mg total) by mouth every 6 (six) hours as needed for moderate pain. 07/16/18  Yes Rayburn, Floyce Stakes, PA-C  Multiple Vitamin (MULTIVITAMIN WITH MINERALS) TABS tablet Take 1 tablet by mouth daily.   Yes [provider]  OVER THE COUNTER MEDICATION Take 1 capsule by mouth daily. Mega 4    Yes [provider]  traMADol (ULTRAM) 50 MG tablet Take 1-2 tablets (50-100 mg total) by mouth every 6 (six) hours as needed for moderate pain or severe pain. 07/16/18  Yes Rayburn, Floyce Stakes, PA-C     Vital Signs: BP (!) 151/78 (BP Location: Left Leg)   Pulse 79   Temp 98.7 F (37.1 C) (Oral)   Resp 16   Ht 5' 8.5" (1.74 m)   Wt 295 lb (133.8 kg)   SpO2 97%   BMI 44.20 kg/m   Physical Exam  NAD, alert Abdomen: RLQ drain in place.  Insertion site intact. Some drainage around site.  Foul-smelling output, slightly cloudy. 30 mL output so far today.   Imaging: Ct Abdomen Pelvis W Contrast  Result Date: 07/25/2018 CLINICAL DATA:   Inpatient. Perforated appendicitis status post percutaneous drainage. Persistent abdominal pain and fever. EXAM: CT ABDOMEN AND PELVIS WITH CONTRAST TECHNIQUE: Multidetector CT imaging of the abdomen and pelvis was performed using the standard protocol following bolus administration of intravenous contrast. CONTRAST:  133mL OMNIPAQUE IOHEXOL 300 MG/ML  SOLN COMPARISON:  07/18/2018 CT abdomen/pelvis. FINDINGS: Lower chest: Mild-to-moderate passive atelectasis at the dependent lung bases bilaterally. Hepatobiliary: Normal liver size. Posterior right liver lobe 1.7 cm cyst. Additional scattered subcentimeter hypodense right liver lesions are too small to characterize and are unchanged. No new liver lesions. Mild diffuse gallbladder wall thickening, new. No gallbladder distention. No radiopaque cholelithiasis. No biliary ductal dilatation. Pancreas: Normal, with no mass or duct dilation. Spleen: Normal size. No mass. Adrenals/Urinary Tract: Stable adrenal glands without discrete adrenal nodules. Nonobstructive 6 mm upper right renal stone. No hydronephrosis. No renal masses. Normal bladder. Stomach/Bowel: Stable gastric band in the gastric cardia region with intact tubing connecting to the subcutaneous ventral right abdominal wall port. Stomach is nondistended and without acute abnormality. Normal caliber small bowel with no small bowel wall thickening. The periappendiceal abscess has resolved status post percutaneous drainage, with ill-defined fat stranding surrounding the pigtail tip of percutaneous drain, with no residual measurable collection. Stable reactive wall thickening in the cecum. Oral contrast transits to the colon. Large bowel is relatively collapsed. Few scattered fluid levels in the left colon. Reactive wall thickening in the sigmoid colon  adjacent to cecum in the right lower quadrant, similar. No diverticulosis. Vascular/Lymphatic: Normal caliber abdominal aorta. Patent portal, splenic, hepatic and  renal veins. No pathologically enlarged lymph nodes in the abdomen or pelvis. Reproductive: Mildly enlarged myomatous uterus with coarsely calcified anterior uterine body fibroid. No adnexal masses. Other: New tiny thick-walled fluid collection in the anterior right abdomen anterior to the gallbladder fundus (series 3/image 33). No pneumoperitoneum. No ascites. Musculoskeletal: No aggressive appearing focal osseous lesions. Mild thoracolumbar spondylosis. IMPRESSION: 1. Periappendiceal right lower quadrant abscess has resolved status post percutaneous drainage. 2. New tiny thick-walled fluid collection in the anterior right abdomen anterior to the gallbladder fundus, which could represent a new tiny abscess, which is probably too small to drain. 3. Nonspecific new mild diffuse gallbladder wall thickening, potentially reactive. No radiopaque cholelithiasis. No gallbladder distention. 4. Stable reactive wall thickening in the cecum and sigmoid colon. 5. Mild-to-moderate bibasilar atelectasis. Electronically Signed   By: Delbert PhenixJason A Poff M.D.   On: 07/25/2018 19:27   [place] Ekg Site Rite  Result Date: 07/23/2018 If Site Rite image not attached, placement could not be confirmed due to current cardiac rhythm.   Labs:  CBC: Recent Labs    07/21/18 0818 07/22/18 0156 07/23/18 0530 07/24/18 0340  WBC 14.5* 13.1* 10.5 9.0  HGB 11.5* 11.6* 11.8* 11.6*  HCT 35.7* 35.3* 35.9* 35.4*  PLT 317 316 344 317    COAGS: Recent Labs    07/19/18 0818  INR 1.2    BMP: Recent Labs    07/21/18 0818 07/22/18 0156 07/23/18 0530 07/24/18 0340 07/25/18 0336  NA 136 136 136 137  --   K 3.2* 3.5 3.1* 3.2*  --   CL 103 102 100 101  --   CO2 24 23 25 27   --   GLUCOSE 147* 124* 109* 109*  --   BUN 8 11 7* 7*  --   CALCIUM 8.2* 8.3* 8.5* 8.5*  --   CREATININE 0.86 0.82 0.80 0.78 0.74  GFRNONAA >60 >60 >60 >60 >60  GFRAA >60 >60 >60 >60 >60    LIVER FUNCTION TESTS: Recent Labs    07/20/18 0256 07/21/18 0818  07/22/18 0156 07/23/18 0530  BILITOT 1.0 1.1 0.7 0.4  AST 22 12* 12* 15  ALT 26 19 15 15   ALKPHOS 68 54 56 55  PROT 6.5 6.2* 6.3* 6.7  ALBUMIN 2.3* 2.1* 2.2* 2.2*    Assessment and Plan: Perforated appendicitis s/p drain placement 6/29 by Dr. Archer AsaMcCullough Patient s/p drain placement 1 week ago.  There was initial concern for feculent-appearing drainage.  Patient endorses foul-smelling odor from drain.  Foul smelling odor present today. 20-30 mL/day.  Output is dark, but appears to be clearing.  CT obtained yesterday and reviewed by Dr. Archer AsaMcCullough who reports improvement in fluid collection, resolve of abscess.  Will stop flushes.  IR following.   Electronically Signed: Hoyt KochKacie Sue-Ellen Rayford Williamsen, PA 07/26/2018, 2:30 PM   I spent a total of 15 Minutes at the the patient's bedside AND on the patient's hospital floor or unit, greater than 50% of which was counseling/coordinating care for perforated appendix.

## 2018-07-26 NOTE — Plan of Care (Signed)

## 2018-07-26 NOTE — Progress Notes (Signed)
Patient ID: Julie Richards, female   DOB: 1950-06-30, 68 y.o.   MRN: [id number] [facility] Progress Note:   * No surgery found *  Subjective: Mental status is clear; sitting up beside bed.   Objective: Vital signs in last 24 hours: Temp:  [98.2 F (36.8 C)-98.6 F (37 C)] 98.6 F (37 C) (07/05 0519) Pulse Rate:  [70-83] 70 (07/05 0519) Resp:  [18-24] 18 (07/05 0519) BP: (134-139)/(60-68) 138/65 (07/05 0519) SpO2:  [96 %-98 %] 98 % (07/05 0519)  Intake/Output from previous day: 07/04 0701 - 07/05 0700 In: 865 [P.O.:840; I.V.:15] Out: 30 [Drains:30] Intake/Output this shift: No intake/output data recorded.  Physical Exam: Work of breathing is normal.  Drain in place;  No complaints of pain.  Lab Results:  Results for orders placed or performed during the hospital encounter of 07/18/18 (from the past 48 hour(s))  Creatinine, serum     Status: None   Collection Time: 07/25/18  3:36 AM  Result Value Ref Range   Creatinine, Ser 0.74 0.44 - 1.00 mg/dL   GFR calc non Af Amer >60 >60 mL/min   GFR calc Af Amer >60 >60 mL/min    Comment: Performed at [place] [address], [place], [place] [address]    Radiology/Results: Ct Abdomen Pelvis W Contrast  Result Date: 07/25/2018 CLINICAL DATA:  Inpatient. Perforated appendicitis status post percutaneous drainage. Persistent abdominal pain and fever. EXAM: CT ABDOMEN AND PELVIS WITH CONTRAST TECHNIQUE: Multidetector CT imaging of the abdomen and pelvis was performed using the standard protocol following bolus administration of intravenous contrast. CONTRAST:  165mL OMNIPAQUE IOHEXOL 300 MG/ML  SOLN COMPARISON:  07/18/2018 CT abdomen/pelvis. FINDINGS: Lower chest: Mild-to-moderate passive atelectasis at the dependent lung bases bilaterally. Hepatobiliary: Normal liver size. Posterior right liver lobe 1.7 cm cyst. Additional scattered subcentimeter hypodense right liver lesions are too small to characterize and are  unchanged. No new liver lesions. Mild diffuse gallbladder wall thickening, new. No gallbladder distention. No radiopaque cholelithiasis. No biliary ductal dilatation. Pancreas: Normal, with no mass or duct dilation. Spleen: Normal size. No mass. Adrenals/Urinary Tract: Stable adrenal glands without discrete adrenal nodules. Nonobstructive 6 mm upper right renal stone. No hydronephrosis. No renal masses. Normal bladder. Stomach/Bowel: Stable gastric band in the gastric cardia region with intact tubing connecting to the subcutaneous ventral right abdominal wall port. Stomach is nondistended and without acute abnormality. Normal caliber small bowel with no small bowel wall thickening. The periappendiceal abscess has resolved status post percutaneous drainage, with ill-defined fat stranding surrounding the pigtail tip of percutaneous drain, with no residual measurable collection. Stable reactive wall thickening in the cecum. Oral contrast transits to the colon. Large bowel is relatively collapsed. Few scattered fluid levels in the left colon. Reactive wall thickening in the sigmoid colon adjacent to cecum in the right lower quadrant, similar. No diverticulosis. Vascular/Lymphatic: Normal caliber abdominal aorta. Patent portal, splenic, hepatic and renal veins. No pathologically enlarged lymph nodes in the abdomen or pelvis. Reproductive: Mildly enlarged myomatous uterus with coarsely calcified anterior uterine body fibroid. No adnexal masses. Other: New tiny thick-walled fluid collection in the anterior right abdomen anterior to the gallbladder fundus (series 3/image 33). No pneumoperitoneum. No ascites. Musculoskeletal: No aggressive appearing focal osseous lesions. Mild thoracolumbar spondylosis. IMPRESSION: 1. Periappendiceal right lower quadrant abscess has resolved status post percutaneous drainage. 2. New tiny thick-walled fluid collection in the anterior right abdomen anterior to the gallbladder fundus, which  could represent a new tiny abscess, which is probably too  small to drain. 3. Nonspecific new mild diffuse gallbladder wall thickening, potentially reactive. No radiopaque cholelithiasis. No gallbladder distention. 4. Stable reactive wall thickening in the cecum and sigmoid colon. 5. Mild-to-moderate bibasilar atelectasis. Electronically Signed   By: Delbert PhenixJason A Poff M.D.   On: 07/25/2018 19:27    Anti-infectives: Anti-infectives (From admission, onward)   Start     Dose/Rate Route Frequency Ordered Stop   07/23/18 1200  piperacillin-tazobactam (ZOSYN) IVPB 3.375 g     3.375 g 12.5 mL/hr over 240 Minutes Intravenous Every 8 hours 07/23/18 1137     07/22/18 1830  metroNIDAZOLE (FLAGYL) tablet 500 mg  Status:  Discontinued     500 mg Oral Every 8 hours 07/22/18 1801 07/23/18 1136   07/19/18 0200  ceFEPIme (MAXIPIME) 2 g in sodium chloride 0.9 % 100 mL IVPB  Status:  Discontinued     2 g 200 mL/hr over 30 Minutes Intravenous Every 8 hours 07/18/18 1602 07/23/18 1136   07/19/18 0200  metroNIDAZOLE (FLAGYL) IVPB 500 mg  Status:  Discontinued     500 mg 100 mL/hr over 60 Minutes Intravenous Every 8 hours 07/18/18 2110 07/22/18 1801   07/18/18 2115  ceFEPIme (MAXIPIME) 2 g in sodium chloride 0.9 % 100 mL IVPB  Status:  Discontinued     2 g 200 mL/hr over 30 Minutes Intravenous Every 8 hours 07/18/18 2110 07/18/18 2219   07/18/18 1530  ceFEPIme (MAXIPIME) 2 g in sodium chloride 0.9 % 100 mL IVPB     2 g 200 mL/hr over 30 Minutes Intravenous  Once 07/18/18 1523 07/18/18 1838   07/18/18 1530  metroNIDAZOLE (FLAGYL) IVPB 500 mg     500 mg 100 mL/hr over 60 Minutes Intravenous  Once 07/18/18 1523 07/18/18 2104      Assessment/Plan: Problem List: Patient Active Problem List   Diagnosis Date Noted  . Abscess of abdominal cavity (HCC) 07/18/2018  . Perforated appendicitis 07/09/2018    CT shows resolution of periappendiceal abscess but ? Abscess around the GB (no stones).  Will discuss removing  drain tomorrow and continued antibiotics and followup of the suspicious area around gallbladder.   * No surgery found *    LOS: 8 days   Matt B. Daphine DeutscherMartin, MD, [facility]  [facility] [phone] beeper [phone]  07/26/2018 9:29 AM

## 2018-07-27 MED ORDER — AMOXICILLIN-POT CLAVULANATE 875-125 MG PO TABS: 1.0000 | ORAL_TABLET | Freq: Two times a day (BID) | ORAL | 0 refills | Status: AC

## 2018-07-27 MED ORDER — ENOXAPARIN SODIUM 80 MG/0.8ML ~~LOC~~ SOLN: 65.0000 mg | SUBCUTANEOUS | Status: DC

## 2018-07-27 MED ORDER — ONDANSETRON 4 MG PO TBDP: 4.0000 mg | ORAL_TABLET | Freq: Four times a day (QID) | ORAL | 0 refills | Status: DC | PRN

## 2018-07-27 NOTE — Progress Notes (Signed)
Patient discharged to home with instructions given to patient and her niece, sent some supplies also.

## 2018-07-27 NOTE — Progress Notes (Signed)
Referring Physician(s): Kinsinger, Franky MachoLuke  Supervising Physician: Richarda OverlieHenn, Adam  Patient Status:  Julie Richards  Chief Complaint: "Tired"  Subjective:  Peri appendiceal abscess s/p RLQ drain placement 07/20/2018 by Dr. Archer AsaMcCullough. Patient awake and alert sitting in bed in the dark. States that she is tired- states she has insomnia and has been having issues sleeping. RLQ drain site c/d/i.   Allergies: Percocet [oxycodone-acetaminophen]  Medications: Prior to Admission medications   Medication Sig Start Date End Date Taking? Authorizing Provider  acetaminophen (TYLENOL) 325 MG tablet Take 2 tablets (650 mg total) by mouth every 6 (six) hours as needed for mild pain. Patient taking differently: Take 325 mg by mouth every 6 (six) hours as needed for mild pain.  07/16/18  Yes Rayburn, Alphonsus SiasKelly A, PA-C  Cholecalciferol (VITAMIN D3 PO) Take 1 capsule by mouth daily.    Yes [provider]  docusate sodium (COLACE) 100 MG capsule Take 100 mg by mouth daily as needed for mild constipation (bloatin).   Yes [provider]  ibuprofen (ADVIL) 400 MG tablet Take 1 tablet (400 mg total) by mouth every 6 (six) hours as needed for moderate pain. 07/16/18  Yes Rayburn, Alphonsus SiasKelly A, PA-C  Multiple Vitamin (MULTIVITAMIN WITH MINERALS) TABS tablet Take 1 tablet by mouth daily.   Yes [provider]  OVER THE COUNTER MEDICATION Take 1 capsule by mouth daily. Mega 4    Yes [provider]  traMADol (ULTRAM) 50 MG tablet Take 1-2 tablets (50-100 mg total) by mouth every 6 (six) hours as needed for moderate pain or severe pain. 07/16/18  Yes Rayburn, Alphonsus SiasKelly A, PA-C     Vital Signs: BP (!) 149/68 (BP Location: Left Leg)    Pulse 68    Temp 97.6 F (36.4 C) (Oral)    Resp 18    Ht 5' 8.5" (1.74 m)    Wt 295 lb (133.8 kg)    SpO2 96%    BMI 44.20 kg/m   Physical Exam Vitals signs and nursing note reviewed.  Constitutional:      General: She is not in acute distress.  Appearance: Normal appearance.  Pulmonary:     Effort: Pulmonary effort is normal. No respiratory distress.  Abdominal:     Comments: RLQ drain site without tenderness, erythema, drainage, or active bleeding; minimal output of thick brown fluid in JP drain (of note, patient states that drain was emptied this AM prior to my arrival, per chart approximately 10 cc additional output in past 24 hours).  Skin:    General: Skin is warm and dry.  Neurological:     Mental Status: She is alert and oriented to person, place, and time.  Psychiatric:        Mood and Affect: Mood normal.        Behavior: Behavior normal.        Thought Content: Thought content normal.        Judgment: Judgment normal.     Imaging: Ct Abdomen Pelvis W Contrast  Result Date: 07/25/2018 CLINICAL DATA:  Inpatient. Perforated appendicitis status post percutaneous drainage. Persistent abdominal pain and fever. EXAM: CT ABDOMEN AND PELVIS WITH CONTRAST TECHNIQUE: Multidetector CT imaging of the abdomen and pelvis was performed using the standard protocol following bolus administration of intravenous contrast. CONTRAST:  100mL OMNIPAQUE IOHEXOL 300 MG/ML  SOLN COMPARISON:  07/18/2018 CT abdomen/pelvis. FINDINGS: Lower chest: Mild-to-moderate passive atelectasis at the dependent lung bases bilaterally. Hepatobiliary: Normal liver size. Posterior right liver lobe 1.7  cm cyst. Additional scattered subcentimeter hypodense right liver lesions are too small to characterize and are unchanged. No new liver lesions. Mild diffuse gallbladder wall thickening, new. No gallbladder distention. No radiopaque cholelithiasis. No biliary ductal dilatation. Pancreas: Normal, with no mass or duct dilation. Spleen: Normal size. No mass. Adrenals/Urinary Tract: Stable adrenal glands without discrete adrenal nodules. Nonobstructive 6 mm upper right renal stone. No hydronephrosis. No renal masses. Normal bladder. Stomach/Bowel: Stable gastric band in the  gastric cardia region with intact tubing connecting to the subcutaneous ventral right abdominal wall port. Stomach is nondistended and without acute abnormality. Normal caliber small bowel with no small bowel wall thickening. The periappendiceal abscess has resolved status post percutaneous drainage, with ill-defined fat stranding surrounding the pigtail tip of percutaneous drain, with no residual measurable collection. Stable reactive wall thickening in the cecum. Oral contrast transits to the colon. Large bowel is relatively collapsed. Few scattered fluid levels in the left colon. Reactive wall thickening in the sigmoid colon adjacent to cecum in the right lower quadrant, similar. No diverticulosis. Vascular/Lymphatic: Normal caliber abdominal aorta. Patent portal, splenic, hepatic and renal veins. No pathologically enlarged lymph nodes in the abdomen or pelvis. Reproductive: Mildly enlarged myomatous uterus with coarsely calcified anterior uterine body fibroid. No adnexal masses. Other: New tiny thick-walled fluid collection in the anterior right abdomen anterior to the gallbladder fundus (series 3/image 33). No pneumoperitoneum. No ascites. Musculoskeletal: No aggressive appearing focal osseous lesions. Mild thoracolumbar spondylosis. IMPRESSION: 1. Periappendiceal right lower quadrant abscess has resolved status post percutaneous drainage. 2. New tiny thick-walled fluid collection in the anterior right abdomen anterior to the gallbladder fundus, which could represent a new tiny abscess, which is probably too small to drain. 3. Nonspecific new mild diffuse gallbladder wall thickening, potentially reactive. No radiopaque cholelithiasis. No gallbladder distention. 4. Stable reactive wall thickening in the cecum and sigmoid colon. 5. Mild-to-moderate bibasilar atelectasis. Electronically Signed   By: Ilona Sorrel M.D.   On: 07/25/2018 19:27    Labs:  CBC: Recent Labs    07/21/18 0818 07/22/18 0156  07/23/18 0530 07/24/18 0340  WBC 14.5* 13.1* 10.5 9.0  HGB 11.5* 11.6* 11.8* 11.6*  HCT 35.7* 35.3* 35.9* 35.4*  PLT 317 316 344 317    COAGS: Recent Labs    07/19/18 0818  INR 1.2    BMP: Recent Labs    07/21/18 0818 07/22/18 0156 07/23/18 0530 07/24/18 0340 07/25/18 0336  NA 136 136 136 137  --   K 3.2* 3.5 3.1* 3.2*  --   CL 103 102 100 101  --   CO2 24 23 25 27   --   GLUCOSE 147* 124* 109* 109*  --   BUN 8 11 7* 7*  --   CALCIUM 8.2* 8.3* 8.5* 8.5*  --   CREATININE 0.86 0.82 0.80 0.78 0.74  GFRNONAA >60 >60 >60 >60 >60  GFRAA >60 >60 >60 >60 >60    LIVER FUNCTION TESTS: Recent Labs    07/20/18 0256 07/21/18 0818 07/22/18 0156 07/23/18 0530  BILITOT 1.0 1.1 0.7 0.4  AST 22 12* 12* 15  ALT 26 19 15 15   ALKPHOS 68 54 56 55  PROT 6.5 6.2* 6.3* 6.7  ALBUMIN 2.3* 2.1* 2.2* 2.2*    Assessment and Plan:  Peri appendiceal abscess s/p RLQ drain placement 07/20/2018 by Dr. Laurence Ferrari. RLQ drain stable with minimal output of thick brown fluid in JP drain (of note, patient states that drain was emptied this AM prior to my arrival,  per chart approximately 10 cc additional output in past 24 hours). Continue with Qshift monitor of output- no flushing at this time per Dr. Archer AsaMcCullough (CT shows resolution of abscess). Plan for repeat CT/possible drain injection when output <10cc/24 hours to assess for possible removal. Further plans per [facility]- appreciate and agree with management. IR to follow.   Electronically Signed: Elwin MochaAlexandra Neera Teng, PA-C 07/27/2018, 11:59 AM   I spent a total of 25 Minutes at the the patient's bedside AND on the patient's hospital floor or unit, greater than 50% of which was counseling/coordinating care for peri appendiceal abscess s/p RLQ drain placement.

## 2018-07-27 NOTE — Discharge Instructions (Signed)
Surgical [facility] Surgical drains are used to remove extra fluid that normally builds up in a surgical wound after surgery. A surgical drain helps to heal a surgical wound. Different kinds of surgical drains include:  Active drains. These drains use suction to pull drainage away from the surgical wound. Drainage flows through a tube to a container outside of the body. With these drains, you need to keep the bulb or the drainage container flat (compressed) at all times, except while you empty it. Flattening the bulb or container creates suction.  Passive drains. These drains allow fluid to drain naturally, by gravity. Drainage flows through a tube to a bandage (dressing) or a container outside of the body. Passive drains do not need to be emptied. A drain is placed during surgery. Right after surgery, drainage is usually bright red and a little thicker than water. The drainage may gradually turn yellow or pink and become thinner. It is likely that your health care provider will remove the drain when the drainage stops or when the amount decreases to 1-2 Tbsp (15-30 mL) during a 24-hour period. Supplies needed:  Tape.  Germ-free cleaning solution (sterile saline).  Cotton swabs.  Split gauze drain sponge: 4 x 4 inches (10 x 10 cm).  Gauze square: 4 x 4 inches (10 x 10 cm). How to care for your surgical drain Care for your drain as told by your health care provider. This is important to help prevent infection. If your drain is placed at your back, or any other hard-to-reach area, ask another person to assist you in performing the following tasks: General care  Keep the skin around the drain dry and covered with a dressing at all times.  Check your drain area every day for signs of infection. Check for: ? Redness, swelling, or pain. ? Pus or a bad smell. ? Cloudy drainage. ? Tenderness or pressure at the drain exit site. Changing the dressing Follow instructions from your health care  provider about how to change your dressing. Change your dressing at least once a day. Change it more often if needed to keep the dressing dry. Make sure you: 1. Gather your supplies. 2. Wash your hands with soap and water before you change your dressing. If soap and water are not available, use hand sanitizer. 3. Remove the old dressing. Avoid using scissors to do that. 4. Wash your hands with soap and water again after removing the old dressing. 5. Use sterile saline to clean your skin around the drain. You may need to use a cotton swab to clean the skin. 6. Place the tube through the slit in a drain sponge. Place the drain sponge so that it covers your wound. 7. Place the gauze square or another drain sponge on top of the drain sponge that is on the wound. Make sure the tube is between those layers. 8. Tape the dressing to your skin. 9. Tape the drainage tube to your skin 1-2 inches (2.5-5 cm) below the place where the tube enters your body. Taping keeps the tube from pulling on any stitches (sutures) that you have. 10. Wash your hands with soap and water. 11. Write down the color of your drainage and how often you change your dressing. How to empty your active drain  1. Make sure that you have a measuring cup that you can empty your drainage into. 2. Wash your hands with soap and water. If soap and water are not available, use hand sanitizer. 3.  Loosen any pins or clips that hold the tube in place. 4. If your health care provider tells you to strip the tube to prevent clots and tube blockages: ? Hold the tube at the skin with one hand. Use your other hand to pinch the tubing with your thumb and first finger. ? Gently move your fingers down the tube while squeezing very lightly. This clears any drainage, clots, or tissue from the tube. ? You may need to do this several times each day to keep the tube clear. Do not pull on the tube. 5. Open the bulb cap or the drain plug. Do not touch the  inside of the cap or the bottom of the plug. 6. Turn the device upside down and gently squeeze. 7. Empty all of the drainage into the measuring cup. 8. Compress the bulb or the container and replace the cap or the plug. To compress the bulb or the container, squeeze it firmly in the middle while you close the cap or plug the container. 9. Write down the amount of drainage that you have in each 24-hour period. If you have less than 2 Tbsp (30 mL) of drainage during 24 hours, contact your health care provider. 10. Flush the drainage down the toilet. 11. Wash your hands with soap and water. Contact a health care provider if:  You have redness, swelling, or pain around your drain area.  You have pus or a bad smell coming from your drain area.  You have a fever or chills.  The skin around your drain is warm to the touch.  The amount of drainage that you have is increasing instead of decreasing.  You have drainage that is cloudy.  There is a sudden stop or a sudden decrease in the amount of drainage that you have.  Your drain tube falls out.  Your active drain does not stay compressed after you empty it. Summary  Surgical drains are used to remove extra fluid that normally builds up in a surgical wound after surgery.  Different kinds of surgical drains include active drains and passive drains. Active drains use suction to pull drainage away from the surgical wound, and passive drains allow fluid to drain naturally.  It is important to care for your drain to prevent infection. If your drain is placed at your back, or any other hard-to-reach area, ask another person to assist you.  Contact your health care provider if you have redness, swelling, or pain around your drain area. This information is not intended to replace advice given to you by your health care provider. Make sure you discuss any questions you have with your health care provider. Document Released: 01/05/2000 Document  Revised: 02/11/2018 Document Reviewed: 02/11/2018 Elsevier Patient Education  [employer].    Appendicitis, Adult  Appendicitis is inflammation of the appendix. The appendix is a finger-shaped tube that is attached to the large intestine. If appendicitis is not treated, it can cause the appendix to tear (rupture). A ruptured appendix can lead to a life-threatening infection. It can also cause a painful collection of pus (abscess) to form in the appendix. What are the causes? This condition may be caused by a blockage in the appendix that leads to infection. The blockage can be caused by:  A ball of stool (feces).  Enlarged lymph glands. In some cases, the cause may not be known. What increases the risk? Age is a risk factor. You are more likely to develop this condition if you are  between 3310 and 66 years of age. What are the signs or symptoms? Symptoms of this condition include:  Pain that starts around the belly button and moves toward the lower right part of the abdomen. The pain can become more severe as time passes. It gets worse with coughing or sudden movements.  Tenderness in the lower right abdomen.  Nausea.  Vomiting.  Loss of appetite.  Fever.  Difficulty passing stool (constipation).  Passing very loose stools (diarrhea).  Generally feeling unwell. How is this diagnosed? This condition may be diagnosed with:  A physical exam.  Blood tests.  Urine test. To confirm the diagnosis, an ultrasound, MRI, or CT scan may be done. How is this treated? This condition is usually treated with surgery to remove the appendix (appendectomy). There are two methods for doing an appendectomy:  Open appendectomy. In this surgery, the appendix is removed through a large incision that is made in the lower right abdomen. This procedure may be recommended if: ? You have major scarring from a previous surgery. ? You have a bleeding disorder. ? You are pregnant and are  about to give birth. ? You have a condition that makes it hard to do surgery through small incisions (laparoscopic procedure). This includes severe infection or a ruptured appendix.  Laparoscopic appendectomy. In this surgery, the appendix is removed through small incisions. This procedure usually causes less pain and fewer problems than an open appendectomy. It also has a shorter recovery time. If the appendix has ruptured and an abscess has formed:  A drain may be placed into the abscess to remove fluid.  Antibiotic medicines may be given through an IV.  The appendix may or may not need to be removed. Follow these instructions at home: If you had surgery, follow instructions from your health care provider about how to care for yourself at home and how to care for your incision. Medicines  Take over-the-counter and prescription medicines only as told by your health care provider.  If you were prescribed an antibiotic medicine, take it as told by your health care provider. Do not stop taking the antibiotic even if you start to feel better. Eating and drinking  Follow instructions from your health care provider about eating restrictions. You may slowly resume a regular diet once your nausea or vomiting stops. General instructions  Do not use any products that contain nicotine or tobacco, such as cigarettes, e-cigarettes, and chewing tobacco. If you need help quitting, ask your health care provider.  Do not drive or use heavy machinery while taking prescription pain medicine.  Ask your health care provider if the medicine prescribed to you can cause constipation. You may need to take steps to prevent or treat constipation, such as: ? Drink enough fluid to keep your urine pale yellow. ? Take over-the-counter or prescription medicines. ? Eat foods that are high in fiber, such as beans, whole grains, and fresh fruits and vegetables. ? Limit foods that are high in fat and processed sugars,  such as fried or sweet foods.  Keep all follow-up visits as told by your health care provider. This is important. Contact a health care provider if:  There is pus, blood, or excessive drainage coming from your incision.  You have nausea or vomiting. Get help right away if you have:  Worsening abdominal pain.  A fever.  Chills.  Fatigue.  Muscle aches.  Shortness of breath. Summary  Appendicitis is inflammation of the appendix.  This condition may be caused  by a blockage in the appendix that leads to infection.  This condition is usually treated with surgery to remove the appendix. This information is not intended to replace advice given to you by your health care provider. Make sure you discuss any questions you have with your health care provider. Document Released: 01/07/2005 Document Revised: 06/25/2017 Document Reviewed: 06/25/2017 Elsevier Patient Education  2020 [employer]

## 2018-07-27 NOTE — Progress Notes (Signed)
[facility] RN contacted unit and informed that "Discontinue PICC" order must be entered for pt discharge. This RN cannot dc PICC from IVT consult order.

## 2018-07-27 NOTE — Progress Notes (Signed)
[facility]/Trauma Progress Note      Assessment/Plan Pelvic abscess. 9.5 cmsecondary toperforated appendicitis, previous admission 06/18 -S/PIR drainage of pelvic abscess 06/29, cultures showed acinetobacter - repeat CT scan, 07/04, showed resolution of periappendiceal abscess but ? Abscess around the GB (no stones) - will need f/u for this but no TTP of RUQ  - continueIV abx - IR following, no more flushing of drain Hypokalemia - PO replenishment  KZS:WFUX diet VTE: SCD's, lovenox NA:TFTDDUKG & Flagyl 06/27-07/02; Zosyn 07/02>>WBC 9.0 on 07/03 Follow up:TBD  DISPO:PO K, soft diet, likely discharge this afternoon   LOS: 9 days    Subjective: CC: no complaints  Pt is having flatus and BM's. No nausea or vomiting and tolerating FLD. No fever or chills. No abdominal pain. Pt is not sleeping well. She was supposed to have a sleep study last week and would like melatonin if she is here tonight to help with sleep.   Objective: Vital signs in last 24 hours: Temp:  [97.6 F (36.4 C)-98.7 F (37.1 C)] 97.6 F (36.4 C) (07/06 0623) Pulse Rate:  [68-79] 68 (07/06 0623) Resp:  [16-18] 18 (07/06 0623) BP: (149-151)/(63-78) 149/68 (07/06 0623) SpO2:  [96 %-98 %] 96 % (07/06 0623) Last BM Date: 07/24/18  Intake/Output from previous day: 07/05 0701 - 07/06 0700 In: -  Out: 10 [Drains:10] Intake/Output this shift: No intake/output data recorded.  PE: Gen: Alert, NAD, pleasant, cooperative Card: RRR, no M/G/R heard Pulm: CTA, no W/R/R, rate and effort normal Abd: Soft, obese, ND, +BS, drain with scantserouspurulentdrainage,no TTP, no peritonitis  Extremities: no TTP, swelling or redness to calves BL Skin: no rashes noted, warm and dry  Anti-infectives: Anti-infectives (From admission, onward)   Start     Dose/Rate Route Frequency Ordered Stop   07/23/18 1200  piperacillin-tazobactam (ZOSYN) IVPB 3.375 g     3.375 g 12.5 mL/hr over 240  Minutes Intravenous Every 8 hours 07/23/18 1137     07/22/18 1830  metroNIDAZOLE (FLAGYL) tablet 500 mg  Status:  Discontinued     500 mg Oral Every 8 hours 07/22/18 1801 07/23/18 1136   07/19/18 0200  ceFEPIme (MAXIPIME) 2 g in sodium chloride 0.9 % 100 mL IVPB  Status:  Discontinued     2 g 200 mL/hr over 30 Minutes Intravenous Every 8 hours 07/18/18 1602 07/23/18 1136   07/19/18 0200  metroNIDAZOLE (FLAGYL) IVPB 500 mg  Status:  Discontinued     500 mg 100 mL/hr over 60 Minutes Intravenous Every 8 hours 07/18/18 2110 07/22/18 1801   07/18/18 2115  ceFEPIme (MAXIPIME) 2 g in sodium chloride 0.9 % 100 mL IVPB  Status:  Discontinued     2 g 200 mL/hr over 30 Minutes Intravenous Every 8 hours 07/18/18 2110 07/18/18 2219   07/18/18 1530  ceFEPIme (MAXIPIME) 2 g in sodium chloride 0.9 % 100 mL IVPB     2 g 200 mL/hr over 30 Minutes Intravenous  Once 07/18/18 1523 07/18/18 1838   07/18/18 1530  metroNIDAZOLE (FLAGYL) IVPB 500 mg     500 mg 100 mL/hr over 60 Minutes Intravenous  Once 07/18/18 1523 07/18/18 2104      Lab Results:  No results for input(s): WBC, HGB, HCT, PLT in the last 72 hours. BMET Recent Labs    07/25/18 0336  CREATININE 0.74   PT/INR No results for input(s): LABPROT, INR in the last 72 hours. CMP     Component Value Date/Time   NA 137 07/24/2018 0340  K 3.2 (L) 07/24/2018 0340   CL 101 07/24/2018 0340   CO2 27 07/24/2018 0340   GLUCOSE 109 (H) 07/24/2018 0340   BUN 7 (L) 07/24/2018 0340   CREATININE 0.74 07/25/2018 0336   CALCIUM 8.5 (L) 07/24/2018 0340   PROT 6.7 07/23/2018 0530   ALBUMIN 2.2 (L) 07/23/2018 0530   AST 15 07/23/2018 0530   ALT 15 07/23/2018 0530   ALKPHOS 55 07/23/2018 0530   BILITOT 0.4 07/23/2018 0530   GFRNONAA >60 07/25/2018 0336   GFRAA >60 07/25/2018 0336   Lipase     Component Value Date/Time   LIPASE 40 07/18/2018 1500    Studies/Results: Ct Abdomen Pelvis W Contrast  Result Date: 07/25/2018 CLINICAL DATA:  Inpatient.  Perforated appendicitis status post percutaneous drainage. Persistent abdominal pain and fever. EXAM: CT ABDOMEN AND PELVIS WITH CONTRAST TECHNIQUE: Multidetector CT imaging of the abdomen and pelvis was performed using the standard protocol following bolus administration of intravenous contrast. CONTRAST:  100mL OMNIPAQUE IOHEXOL 300 MG/ML  SOLN COMPARISON:  07/18/2018 CT abdomen/pelvis. FINDINGS: Lower chest: Mild-to-moderate passive atelectasis at the dependent lung bases bilaterally. Hepatobiliary: Normal liver size. Posterior right liver lobe 1.7 cm cyst. Additional scattered subcentimeter hypodense right liver lesions are too small to characterize and are unchanged. No new liver lesions. Mild diffuse gallbladder wall thickening, new. No gallbladder distention. No radiopaque cholelithiasis. No biliary ductal dilatation. Pancreas: Normal, with no mass or duct dilation. Spleen: Normal size. No mass. Adrenals/Urinary Tract: Stable adrenal glands without discrete adrenal nodules. Nonobstructive 6 mm upper right renal stone. No hydronephrosis. No renal masses. Normal bladder. Stomach/Bowel: Stable gastric band in the gastric cardia region with intact tubing connecting to the subcutaneous ventral right abdominal wall port. Stomach is nondistended and without acute abnormality. Normal caliber small bowel with no small bowel wall thickening. The periappendiceal abscess has resolved status post percutaneous drainage, with ill-defined fat stranding surrounding the pigtail tip of percutaneous drain, with no residual measurable collection. Stable reactive wall thickening in the cecum. Oral contrast transits to the colon. Large bowel is relatively collapsed. Few scattered fluid levels in the left colon. Reactive wall thickening in the sigmoid colon adjacent to cecum in the right lower quadrant, similar. No diverticulosis. Vascular/Lymphatic: Normal caliber abdominal aorta. Patent portal, splenic, hepatic and renal veins. No  pathologically enlarged lymph nodes in the abdomen or pelvis. Reproductive: Mildly enlarged myomatous uterus with coarsely calcified anterior uterine body fibroid. No adnexal masses. Other: New tiny thick-walled fluid collection in the anterior right abdomen anterior to the gallbladder fundus (series 3/image 33). No pneumoperitoneum. No ascites. Musculoskeletal: No aggressive appearing focal osseous lesions. Mild thoracolumbar spondylosis. IMPRESSION: 1. Periappendiceal right lower quadrant abscess has resolved status post percutaneous drainage. 2. New tiny thick-walled fluid collection in the anterior right abdomen anterior to the gallbladder fundus, which could represent a new tiny abscess, which is probably too small to drain. 3. Nonspecific new mild diffuse gallbladder wall thickening, potentially reactive. No radiopaque cholelithiasis. No gallbladder distention. 4. Stable reactive wall thickening in the cecum and sigmoid colon. 5. Mild-to-moderate bibasilar atelectasis. Electronically Signed   By: Delbert PhenixJason A Poff M.D.   On: 07/25/2018 19:27      Jerre SimonJessica L Avrianna Smart , [facility] 07/27/2018, 9:47 AM  Pager: [phone] Mon-Wed, Friday 7:00am-4:30pm Thurs 7am-11:30am  Consults: [phone]

## 2018-07-27 NOTE — Discharge Summary (Signed)
[facility]/Trauma Discharge Summary   Patient ID: Julie Richards MRN: [id number] DOB/AGE: 02/21/1950 68 y.o.  Admit date: 07/18/2018 Discharge date: 07/27/2018  Admitting Diagnosis: Intraabdominal abscess Hx of ruptured appendicitis   Discharge Diagnosis Patient Active Problem List   Diagnosis Date Noted  . Abscess of abdominal cavity ([place]) 07/18/2018  . Perforated appendicitis 07/09/2018    Consultants Interventional Radiology (IR)  Imaging: Ct Abdomen Pelvis W Contrast  Result Date: 07/25/2018 CLINICAL DATA:  Inpatient. Perforated appendicitis status post percutaneous drainage. Persistent abdominal pain and fever. EXAM: CT ABDOMEN AND PELVIS WITH CONTRAST TECHNIQUE: Multidetector CT imaging of the abdomen and pelvis was performed using the standard protocol following bolus administration of intravenous contrast. CONTRAST:  157mL OMNIPAQUE IOHEXOL 300 MG/ML  SOLN COMPARISON:  07/18/2018 CT abdomen/pelvis. FINDINGS: Lower chest: Mild-to-moderate passive atelectasis at the dependent lung bases bilaterally. Hepatobiliary: Normal liver size. Posterior right liver lobe 1.7 cm cyst. Additional scattered subcentimeter hypodense right liver lesions are too small to characterize and are unchanged. No new liver lesions. Mild diffuse gallbladder wall thickening, new. No gallbladder distention. No radiopaque cholelithiasis. No biliary ductal dilatation. Pancreas: Normal, with no mass or duct dilation. Spleen: Normal size. No mass. Adrenals/Urinary Tract: Stable adrenal glands without discrete adrenal nodules. Nonobstructive 6 mm upper right renal stone. No hydronephrosis. No renal masses. Normal bladder. Stomach/Bowel: Stable gastric band in the gastric cardia region with intact tubing connecting to the subcutaneous ventral right abdominal wall port. Stomach is nondistended and without acute abnormality. Normal caliber small bowel with no small bowel wall thickening. The periappendiceal  abscess has resolved status post percutaneous drainage, with ill-defined fat stranding surrounding the pigtail tip of percutaneous drain, with no residual measurable collection. Stable reactive wall thickening in the cecum. Oral contrast transits to the colon. Large bowel is relatively collapsed. Few scattered fluid levels in the left colon. Reactive wall thickening in the sigmoid colon adjacent to cecum in the right lower quadrant, similar. No diverticulosis. Vascular/Lymphatic: Normal caliber abdominal aorta. Patent portal, splenic, hepatic and renal veins. No pathologically enlarged lymph nodes in the abdomen or pelvis. Reproductive: Mildly enlarged myomatous uterus with coarsely calcified anterior uterine body fibroid. No adnexal masses. Other: New tiny thick-walled fluid collection in the anterior right abdomen anterior to the gallbladder fundus (series 3/image 33). No pneumoperitoneum. No ascites. Musculoskeletal: No aggressive appearing focal osseous lesions. Mild thoracolumbar spondylosis. IMPRESSION: 1. Periappendiceal right lower quadrant abscess has resolved status post percutaneous drainage. 2. New tiny thick-walled fluid collection in the anterior right abdomen anterior to the gallbladder fundus, which could represent a new tiny abscess, which is probably too small to drain. 3. Nonspecific new mild diffuse gallbladder wall thickening, potentially reactive. No radiopaque cholelithiasis. No gallbladder distention. 4. Stable reactive wall thickening in the cecum and sigmoid colon. 5. Mild-to-moderate bibasilar atelectasis. Electronically Signed   By: Ilona Sorrel M.D.   On: 07/25/2018 19:27    Procedures Dr. Laurence Ferrari (07/17/18) - Drain placement  HPI: 68 yo female was admitted 2 weeks ago with ruptured appendicitis. At the time there was no drainable abscess. She was admitted for IV antibiotics. She improved and was discharge home 1 week ago. She did well but over the last few days has had  increasing bloating, abdominal pain. Now she has nausea and vomiting and the pain has increased in severity.  Hospital Course:  Workup showed larger 9cm intraabdominal abscess .  Patient was admitted and underwent procedure listed above. Hospital course complicated by ileus. This eventually resolved and diet was  advanced as tolerated. Repeat CT scan on 07/04 showed resolution of RLQ abscess. On 07/06, the patient was voiding well, tolerating diet, ambulating well, not having pain, vital signs stable, drain with minimal output and felt stable for discharge home.  Patient will follow up as outlined below and knows to call with questions or concerns.     Patient was discharged in good condition.  The [place] Substance controlled database was reviewed prior to prescribing narcotic pain medication to this patient.  Physical Exam: Gen: Alert, NAD, pleasant, cooperative Card: RRR, no M/G/R heard Pulm: CTA, no W/R/R, rate and effort normal Abd: Soft, obese, ND, +BS, drain with scantserouspurulentdrainage,no TTP, no peritonitis  Extremities: no TTP, swelling or redness to calves BL Skin: no rashes noted, warm and dry   Allergies as of 07/27/2018      Reactions   Percocet [oxycodone-acetaminophen] Nausea And Vomiting      Medication List    TAKE these medications   acetaminophen 325 MG tablet Commonly known as: TYLENOL Take 2 tablets (650 mg total) by mouth every 6 (six) hours as needed for mild pain. What changed: how much to take   amoxicillin-clavulanate 875-125 MG tablet Commonly known as: Augmentin Take 1 tablet by mouth every 12 (twelve) hours for 5 days. What changed: when to take this   docusate sodium 100 MG capsule Commonly known as: COLACE Take 100 mg by mouth daily as needed for mild constipation (bloatin).   ibuprofen 400 MG tablet Commonly known as: ADVIL Take 1 tablet (400 mg total) by mouth every 6 (six) hours as needed for moderate pain.   multivitamin  with minerals Tabs tablet Take 1 tablet by mouth daily.   ondansetron 4 MG disintegrating tablet Commonly known as: ZOFRAN-ODT Take 1 tablet (4 mg total) by mouth every 6 (six) hours as needed for nausea.   OVER THE COUNTER MEDICATION Take 1 capsule by mouth daily. Mega 4   traMADol 50 MG tablet Commonly known as: ULTRAM Take 1-2 tablets (50-100 mg total) by mouth every 6 (six) hours as needed for moderate pain or severe pain.   VITAMIN D3 PO Take 1 capsule by mouth daily.        Follow-up Information    Cornett, Maisie Fushomas, MD. Schedule an appointment as soon as possible for a visit in 3 week(s).   Specialty: General Surgery Why: for follow up regarding removal of appendix  Contact information: [address] [place] [address] [phone]        Malachy MoanMcCullough, Heath, MD Follow up in 9 day(s).   Specialties: Interventional Radiology, Radiology Why: call [phone] if any questions or concerns; pt will hear from scheduler for time and date of follow up Contact information: [address] [place] [address] [phone]           Signed: Joyce CopaJessica Richards [facility] 07/27/2018, 2:58 PM Pager: [phone] Consults: [phone] Mon-Fri 7:00 am-4:30 pm Sat-Sun 7:00 am-11:30 am

## 2018-07-27 NOTE — Care Management Important Message (Signed)
Important Message  Patient Details  Name: Julie Richards MRN: [id number] Date of Birth: 10-27-1950   Medicare Important Message Given:  Yes     Memory Argue 07/27/2018, 2:30 PM

## 2018-07-28 ENCOUNTER — Other Ambulatory Visit: Payer: Self-pay | Admitting: Surgery

## 2018-07-28 ENCOUNTER — Ambulatory Visit: Payer: Medicare Other

## 2018-07-28 DIAGNOSIS — K3532 Acute appendicitis with perforation and localized peritonitis, without abscess: Secondary | ICD-10-CM

## 2018-08-06 ENCOUNTER — Other Ambulatory Visit: Payer: Medicare Other

## 2018-08-06 ENCOUNTER — Other Ambulatory Visit: Payer: Self-pay

## 2018-08-06 ENCOUNTER — Ambulatory Visit
Admission: RE | Admit: 2018-08-06 | Discharge: 2018-08-06 | Disposition: A | Discharge status: Home / Self Care | Payer: Medicare Other | Source: Ambulatory Visit | Attending: Radiology | Admitting: Radiology

## 2018-08-06 ENCOUNTER — Encounter: Payer: Self-pay | Admitting: *Deleted

## 2018-08-06 ENCOUNTER — Ambulatory Visit
Admission: RE | Admit: 2018-08-06 | Discharge: 2018-08-06 | Disposition: A | Discharge status: Home / Self Care | Payer: Medicare Other | Source: Ambulatory Visit | Attending: Surgery | Admitting: Surgery

## 2018-08-06 DIAGNOSIS — K3532 Acute appendicitis with perforation and localized peritonitis, without abscess: Secondary | ICD-10-CM

## 2018-08-06 HISTORY — PX: IR RADIOLOGIST EVAL & MGMT: IMG5224

## 2018-08-06 MED ORDER — IOPAMIDOL (ISOVUE-300) INJECTION 61%
125.0000 mL | Freq: Once | INTRAVENOUS | Status: AC | PRN
  Administered 2018-08-06: 125 mL via INTRAVENOUS

## 2018-08-06 NOTE — Progress Notes (Signed)
Chief Complaint: Follow-up status post percutaneous catheter drainage of appendiceal abscess.  History of Present Illness: Julie Richards is a 68 y.o. female with perforated appendicitis resulting in progressive abscess formation and status post percutaneous catheter drainage on 07/20/2018 with placement of a 12 French drain.  Follow-up CT on 07/25/2018 demonstrated resolution of the abscess.  The patient was discharged with the drainage catheter in place due to continued fluid output.  She feels much better since discharge from the hospital and finished antibiotics 2 days ago.  She denies fever, chills or abdominal pain.  She is on a normal diet and has had normal formed stool.  She denies diarrhea or blood in her stool.  Currently, she empties her suction bulb twice daily and empties approximately 5 to 10 mL each time for a total volume of 10 to 20 mL of total output per day.  She estimates that output is likely closer to 10 to 15 mL/day currently of a cloudy yellowish fluid.  The drain is being flushed twice daily with saline.  She has a surgical follow-up appointment with Dr. Luisa Hart on 08/17/2018.  No past medical history on file.  Past Surgical History:  Procedure Laterality Date   IR RADIOLOGIST EVAL & MGMT  08/06/2018   LAPAROSCOPIC GASTRIC BANDING      Allergies: Percocet [oxycodone-acetaminophen]  Medications: Prior to Admission medications   Medication Sig Start Date End Date Taking? Authorizing Provider  acetaminophen (TYLENOL) 325 MG tablet Take 2 tablets (650 mg total) by mouth every 6 (six) hours as needed for mild pain. Patient taking differently: Take 325 mg by mouth every 6 (six) hours as needed for mild pain.  07/16/18   Rayburn, Alphonsus Sias, PA-C  Cholecalciferol (VITAMIN D3 PO) Take 1 capsule by mouth daily.     [provider]  docusate sodium (COLACE) 100 MG capsule Take 100 mg by mouth daily as needed for mild constipation (bloatin).    [provider]  ibuprofen (ADVIL) 400 MG tablet Take 1 tablet (400 mg total) by mouth every 6 (six) hours as needed for moderate pain. 07/16/18   Rayburn, Alphonsus Sias, PA-C  Multiple Vitamin (MULTIVITAMIN WITH MINERALS) TABS tablet Take 1 tablet by mouth daily.    [provider]  ondansetron (ZOFRAN-ODT) 4 MG disintegrating tablet Take 1 tablet (4 mg total) by mouth every 6 (six) hours as needed for nausea. 07/27/18   Focht, Joyce Copa, PA  OVER THE COUNTER MEDICATION Take 1 capsule by mouth daily. Mega 4     [provider]  traMADol (ULTRAM) 50 MG tablet Take 1-2 tablets (50-100 mg total) by mouth every 6 (six) hours as needed for moderate pain or severe pain. 07/16/18   Rayburn, Alphonsus Sias, PA-C     No family history on file.  Social History   Socioeconomic History   Marital status: Widowed    Spouse name: Not on file   Number of children: Not on file   Years of education: Not on file   Highest education level: Not on file  Occupational History   Not on file  Social Needs   Financial resource strain: Not on file   Food insecurity    Worry: Not on file    Inability: Not on file   Transportation needs    Medical: Not on file    Non-medical: Not on file  Tobacco Use   Smoking status: Never Smoker   Smokeless tobacco: Never Used  Substance and Sexual Activity  Alcohol use: Never    Frequency: Never   Drug use: Never   Sexual activity: Not on file  Lifestyle   Physical activity    Days per week: Not on file    Minutes per session: Not on file   Stress: Not on file  Relationships   Social connections    Talks on phone: Not on file    Gets together: Not on file    Attends religious service: Not on file    Active member of club or organization: Not on file    Attends meetings of clubs or organizations: Not on file    Relationship status: Not on file  Other Topics Concern   Not on file  Social History Narrative   Not on file    Review of Systems: A 12  point ROS discussed and pertinent positives are indicated in the HPI above.  All other systems are negative.  Review of Systems  Constitutional: Negative.   Respiratory: Negative.   Cardiovascular: Negative.   Gastrointestinal: Negative.   Genitourinary: Negative.   Musculoskeletal: Negative.   Neurological: Negative.     Vital Signs: Temp 98.3 F, BP 154/86, P 75, O2 sat 98% on RA   Physical Exam Vitals signs reviewed.  Constitutional:      General: She is not in acute distress.    Appearance: Normal appearance. She is not ill-appearing, toxic-appearing or diaphoretic.  Abdominal:     General: There is no distension.     Palpations: Abdomen is soft. There is no mass.     Tenderness: There is no abdominal tenderness. There is no guarding or rebound.     Comments: Drain site intact. Surrounding skin normal. No drainage.  Neurological:     General: No focal deficit present.     Mental Status: She is alert and oriented to person, place, and time.     Imaging: Ct Abdomen Pelvis W Contrast  Result Date: 08/06/2018 CLINICAL DATA:  Perforated appendicitis with appendiceal abscess and status post percutaneous catheter drainage abscess on 07/20/2018. Recent follow-up study demonstrated resolution the appendiceal abscess with some residual inflammation present as well as a tiny fluid collection just deep to the right abdominal wall and anterior to the gallbladder. EXAM: CT ABDOMEN AND PELVIS WITH CONTRAST TECHNIQUE: Multidetector CT imaging of the abdomen and pelvis was performed using the standard protocol following bolus administration of intravenous contrast. CONTRAST:  125mL ISOVUE-300 IOPAMIDOL (ISOVUE-300) INJECTION 61% COMPARISON:  07/25/2018 and additional prior scans. FINDINGS: Lower chest: No acute abnormality. Hepatobiliary: Stable cyst in posterior right lobe of the liver. The gallbladder is contracted and unremarkable. No biliary ductal dilatation. Pancreas: Unremarkable. No  pancreatic ductal dilatation or surrounding inflammatory changes. Spleen: Normal in size without focal abnormality. Adrenals/Urinary Tract: Adrenal glands are unremarkable. Kidneys are normal, without renal calculi, focal lesion, or hydronephrosis. Bladder is unremarkable. Stomach/Bowel: Bowel shows no evidence of obstruction, ileus or significant inflammation. No free air identified. Inflammatory thickening adjacent to the cecum and appendix in the right lower quadrant has improved significantly since the prior scan with little to no further acute appearing inflammation present. Stable appearance of gastric band. The appendix is very difficult to discretely identify but likely lies very close to the percutaneous drain. The appendix is not visibly grossly enlarged or thickened. There is no evidence of a visible calcified appendicolith. Vascular/Lymphatic: No significant vascular findings are present. No enlarged abdominal or pelvic lymph nodes. Reproductive: Uterus and bilateral adnexa are unremarkable. Other: The tiny fluid collection  deep to the right abdominal wall and fairly close to the gallbladder has resolved since the prior scan. No new abscess identified. At the level of the percutaneous drain just medial to the cecum and adjacent to the appendix, no further fluid collection is identified. There is no evidence of extraluminal air or new abscess in this region. Musculoskeletal: No acute or significant osseous findings. IMPRESSION: 1. Resolved appendiceal abscess. Significant reduction in inflammation adjacent to the cecum and involving the appendix. The appendix is not visibly thickened and there is no evidence of an appendicolith. 2. Resolved small fluid collection deep to the right abdominal wall and anterior to the gallbladder on the prior scan 12 days ago. No new fluid collections identified. Electronically Signed   By: Irish LackGlenn  Abbygale Lapid M.D.   On: 08/06/2018 14:08   Ct Abdomen Pelvis W  Contrast  Result Date: 07/25/2018 CLINICAL DATA:  Inpatient. Perforated appendicitis status post percutaneous drainage. Persistent abdominal pain and fever. EXAM: CT ABDOMEN AND PELVIS WITH CONTRAST TECHNIQUE: Multidetector CT imaging of the abdomen and pelvis was performed using the standard protocol following bolus administration of intravenous contrast. CONTRAST:  100mL OMNIPAQUE IOHEXOL 300 MG/ML  SOLN COMPARISON:  07/18/2018 CT abdomen/pelvis. FINDINGS: Lower chest: Mild-to-moderate passive atelectasis at the dependent lung bases bilaterally. Hepatobiliary: Normal liver size. Posterior right liver lobe 1.7 cm cyst. Additional scattered subcentimeter hypodense right liver lesions are too small to characterize and are unchanged. No new liver lesions. Mild diffuse gallbladder wall thickening, new. No gallbladder distention. No radiopaque cholelithiasis. No biliary ductal dilatation. Pancreas: Normal, with no mass or duct dilation. Spleen: Normal size. No mass. Adrenals/Urinary Tract: Stable adrenal glands without discrete adrenal nodules. Nonobstructive 6 mm upper right renal stone. No hydronephrosis. No renal masses. Normal bladder. Stomach/Bowel: Stable gastric band in the gastric cardia region with intact tubing connecting to the subcutaneous ventral right abdominal wall port. Stomach is nondistended and without acute abnormality. Normal caliber small bowel with no small bowel wall thickening. The periappendiceal abscess has resolved status post percutaneous drainage, with ill-defined fat stranding surrounding the pigtail tip of percutaneous drain, with no residual measurable collection. Stable reactive wall thickening in the cecum. Oral contrast transits to the colon. Large bowel is relatively collapsed. Few scattered fluid levels in the left colon. Reactive wall thickening in the sigmoid colon adjacent to cecum in the right lower quadrant, similar. No diverticulosis. Vascular/Lymphatic: Normal caliber  abdominal aorta. Patent portal, splenic, hepatic and renal veins. No pathologically enlarged lymph nodes in the abdomen or pelvis. Reproductive: Mildly enlarged myomatous uterus with coarsely calcified anterior uterine body fibroid. No adnexal masses. Other: New tiny thick-walled fluid collection in the anterior right abdomen anterior to the gallbladder fundus (series 3/image 33). No pneumoperitoneum. No ascites. Musculoskeletal: No aggressive appearing focal osseous lesions. Mild thoracolumbar spondylosis. IMPRESSION: 1. Periappendiceal right lower quadrant abscess has resolved status post percutaneous drainage. 2. New tiny thick-walled fluid collection in the anterior right abdomen anterior to the gallbladder fundus, which could represent a new tiny abscess, which is probably too small to drain. 3. Nonspecific new mild diffuse gallbladder wall thickening, potentially reactive. No radiopaque cholelithiasis. No gallbladder distention. 4. Stable reactive wall thickening in the cecum and sigmoid colon. 5. Mild-to-moderate bibasilar atelectasis. Electronically Signed   By: Delbert PhenixJason A Poff M.D.   On: 07/25/2018 19:27   Ct Abdomen Pelvis W Contrast  Result Date: 07/18/2018 CLINICAL DATA:  Abdominal pain and nausea and vomiting for 10 days. Perforated appendicitis with abscess. EXAM: CT ABDOMEN AND PELVIS WITH  CONTRAST TECHNIQUE: Multidetector CT imaging of the abdomen and pelvis was performed using the standard protocol following bolus administration of intravenous contrast. CONTRAST:  OMNIPAQUE IOHEXOL 300 MG/ML  SOLN COMPARISON:  07/13/2018 FINDINGS: Lower Chest: No acute findings. Hepatobiliary: No hepatic masses identified. Small right hepatic lobe cysts remains stable. Gallbladder is unremarkable. Pancreas:  No mass or inflammatory changes. Spleen: Within normal limits in size and appearance. Adrenals/Urinary Tract: No masses identified. Small right renal calculi again seen no evidence of ureteral calculi or  hydronephrosis. Stomach/Bowel: Gastric lap band again seen in place. Increased size of fluid and gas collection in right lower quadrant with surrounding inflammatory changes. This measures 9.5 x 6.6 cm, compared to 4.7 x 4.2 cm previously. Associated wall thickening of the cecum and ascending colon is again demonstrated. These findings are consistent with ruptured appendicitis with enlarging periappendiceal abscess. Vascular/Lymphatic: No pathologically enlarged lymph nodes. No abdominal aortic aneurysm. Reproductive: Stable small uterine fibroids measuring up to 2 cm. No adnexal mass or free pelvic fluid. Other:  None. Musculoskeletal:  No suspicious bone lesions identified. IMPRESSION: 1. Ruptured appendicitis, with increased size of periappendiceal abscess no measuring 9.5 cm 2. Stable right nephrolithiasis, without ureteral calculi or hydronephrosis. 3. Stable small uterine fibroids. Electronically Signed   By: Danae Orleans M.D.   On: 07/18/2018 18:23   Ct Abdomen Pelvis W Contrast  Result Date: 07/13/2018 CLINICAL DATA:  Perforated appendicitis.  Re-evaluate for abscess. EXAM: CT ABDOMEN AND PELVIS WITH CONTRAST TECHNIQUE: Multidetector CT imaging of the abdomen and pelvis was performed using the standard protocol following bolus administration of intravenous contrast. CONTRAST:  OMNIPAQUE IOHEXOL 300 MG/ML  SOLN COMPARISON:  CT abdomen pelvis dated July 09, 2018. FINDINGS: Lower chest: Minimal bilateral lower lobe subsegmental atelectasis. Hepatobiliary: Unchanged small simple cyst in the right hepatic lobe. No new focal liver abnormality. The gallbladder is unremarkable. No biliary dilatation. Pancreas: Unremarkable. No pancreatic ductal dilatation or surrounding inflammatory changes. Spleen: Normal in size without focal abnormality. Adrenals/Urinary Tract: The adrenal glands and left kidney are unremarkable. Unchanged nonobstructive calculi in the upper pole of the right kidney measuring up to 6  mm. No hydronephrosis. The bladder is under distended. Stomach/Bowel: Perforated appendicitis again identified with appendicolith at the base of the appendix. Inflammatory changes around the appendix have increased. The amount of extraluminal air adjacent to the appendix is slightly increased in size, now measuring 2.7 x 2.5 x 3.4 cm, previously 2.0 x 0.8 x 1.8 cm. 1.8 x 1.5 cm gas and fluid collection anterior to the cecum has slightly increased in size, previously 1.2 x 0.5 cm. Increased wall thickening of the cecum. Prior gastric banding with unchanged small hiatal hernia. No obstruction. Vascular/Lymphatic: No significant vascular findings are present. No enlarged abdominal or pelvic lymph nodes. Reproductive: Unchanged small uterine fibroids.  No adnexal mass. Other: No abdominal wall hernia or abnormality. No abdominopelvic ascites. Musculoskeletal: No acute or significant osseous findings. IMPRESSION: 1. Perforated appendicitis again seen with interval increase in the loculated extraluminal air adjacent to the appendix, now measuring 2.7 x 2.5 x 3.4 cm, previously 2.0 x 0.8 x 1.8 cm. Similarly, there is a small 1.8 x 1.5 cm gas and fluid collection anterior to the cecum that previously measured 1.2 x 0.5 cm, concerning for developing abscess. These both likely remain too small for percutaneous drainage. 2. Unchanged nonobstructive right nephrolithiasis. Electronically Signed   By: Obie Dredge M.D.   On: 07/13/2018 16:45   Ct Abdomen Pelvis W Contrast  Result  Date: 07/09/2018 CLINICAL DATA:  Abdominal pain EXAM: CT ABDOMEN AND PELVIS WITH CONTRAST TECHNIQUE: Multidetector CT imaging of the abdomen and pelvis was performed using the standard protocol following bolus administration of intravenous contrast. CONTRAST:  100mL OMNIPAQUE IOHEXOL 300 MG/ML  SOLN COMPARISON:  None. FINDINGS: Lower chest: No acute abnormality. Hepatobiliary: No focal liver abnormality is seen. No gallstones, gallbladder wall  thickening, or biliary dilatation. Pancreas: Unremarkable. No pancreatic ductal dilatation or surrounding inflammatory changes. Spleen: Normal in size without focal abnormality. Adrenals/Urinary Tract: Adrenal glands are unremarkable. Kidneys are normal, without renal calculi, focal lesion, or hydronephrosis. Bladder is unremarkable. Stomach/Bowel: Prior gastric banding in satisfactory position. Small hiatal hernia. No bowel dilatation to suggest obstruction. Dilated appendix measuring 14 mm with severe surrounding inflammatory changes most consistent with acute appendicitis. There are a few locules air outside of the appendix collectively measuring approximately 18 mm, but adjacent to the appendix most concerning for perforated acute appendicitis. No drainable fluid collection. No pneumatosis, pneumoperitoneum or portal venous gas. Vascular/Lymphatic: No significant vascular findings are present. No enlarged abdominal or pelvic lymph nodes. Reproductive: Uterus and bilateral adnexa are unremarkable. Dystrophic calcification in the uterine fundus likely reflecting a small degenerated fibroid. Other: No abdominal wall hernia or abnormality. No abdominopelvic ascites. Musculoskeletal: No acute osseous abnormality. No aggressive osseous lesion. Bilateral facet arthropathy at L4-5 and L5-S1. IMPRESSION: 1. Dilated appendix with severe periappendiceal inflammatory changes and a few locules of air outside of, but adjacent to, the appendix most concerning for acute perforated appendicitis. Perforation appears contained and there is no drainable fluid collection to suggest an abscess at this time. Electronically Signed   By: Elige KoHetal  Patel   On: 07/09/2018 16:25   Dg Chest Portable 1 View  Result Date: 07/18/2018 CLINICAL DATA:  Fever EXAM: PORTABLE CHEST 1 VIEW COMPARISON:  None. FINDINGS: Cardiomegaly. Pulmonary vascular prominence without focal airspace opacity or overt edema. The visualized skeletal structures are  unremarkable. IMPRESSION: Cardiomegaly. Pulmonary vascular prominence without focal airspace opacity or overt edema. Electronically Signed   By: Lauralyn PrimesAlex  Bibbey M.D.   On: 07/18/2018 17:23   Ct Image Guided Drainage By Percutaneous Catheter  Result Date: 07/20/2018 INDICATION: 68 year old female with ruptured appendicitis and right lower quadrant peri appendiceal abscess. She presents for CT-guided drain placement. EXAM: CT-guided drain placement MEDICATIONS: The patient is currently admitted to the hospital and receiving intravenous antibiotics. The antibiotics were administered within an appropriate time frame prior to the initiation of the procedure. ANESTHESIA/SEDATION: Fentanyl 100 mcg IV; Versed 2 mg IV Moderate Sedation Time:  23 minutes The patient was continuously monitored during the procedure by the interventional radiology nurse under my direct supervision. COMPLICATIONS: None immediate. PROCEDURE: Informed written consent was obtained from the patient after a thorough discussion of the procedural risks, benefits and alternatives. All questions were addressed. A timeout was performed prior to the initiation of the procedure. A planning axial CT scan was performed. The fluid and gas collection in the right lower quadrant adjacent to the appendix was successfully identified. A suitable skin entry site was selected and marked. The overlying skin was sterilely prepped and draped in the standard fashion using chlorhexidine skin prep. Local anesthesia was attained by infiltration with 1% lidocaine. Under intermittent CT guidance, an 18 gauge trocar needle was carefully advanced through the right lower quadrant anterior abdominal wall and into the fluid and gas collection. An Amplatz wire was then coiled within the collection. A small dermatotomy was made. The skin tract was dilated to 12 [place].  A Cook 12 French drainage catheter was then advanced over the wire and formed in the fluid collection. Aspiration  yields nearly 200 mL of thick, foul-smelling purulent fluid. A sample was sent for Gram stain and culture. Additional CT imaging confirms excellent placement of the drainage catheter and near-total resolution of the abscess cavity. The abscess cavity was then lavaged with sterile saline in the drain connected to JP bulb suction. The drain was then secured to the skin with 0 Prolene suture. Sterile bandages were applied. IMPRESSION: Technically successful placement of a 12 French drainage catheter into the right lower quadrant peri-appendiceal abscess yielding approximately 200 mL of thick, foul-smelling purulent fluid. PLAN: 1. Maintain drain to JP bulb suction. 2. Flush at least once per shift. 3. Follow-up in drain clinic with CT scan of the abdomen and pelvis and drain injection in 2 weeks. Signed, Criselda Peaches, MD, [facility] Electronically Signed   By: Jacqulynn Cadet M.D.   On: 07/20/2018 15:00   [place] Ekg Site Rite  Result Date: 07/23/2018 If Site Rite image not attached, placement could not be confirmed due to current cardiac rhythm.  Ir Radiologist Eval & Mgmt  Result Date: 08/06/2018 Please refer to notes tab for details about interventional procedure. (Op Note)   Labs:  CBC: Recent Labs    07/21/18 0818 07/22/18 0156 07/23/18 0530 07/24/18 0340  WBC 14.5* 13.1* 10.5 9.0  HGB 11.5* 11.6* 11.8* 11.6*  HCT 35.7* 35.3* 35.9* 35.4*  PLT 317 316 344 317    COAGS: Recent Labs    07/19/18 0818  INR 1.2    BMP: Recent Labs    07/21/18 0818 07/22/18 0156 07/23/18 0530 07/24/18 0340 07/25/18 0336  NA 136 136 136 137  --   K 3.2* 3.5 3.1* 3.2*  --   CL 103 102 100 101  --   CO2 24 23 25 27   --   GLUCOSE 147* 124* 109* 109*  --   BUN 8 11 7* 7*  --   CALCIUM 8.2* 8.3* 8.5* 8.5*  --   CREATININE 0.86 0.82 0.80 0.78 0.74  GFRNONAA >60 >60 >60 >60 >60  GFRAA >60 >60 >60 >60 >60    LIVER FUNCTION  TESTS: Recent Labs    07/20/18 0256 07/21/18 0818 07/22/18 0156 07/23/18 0530  BILITOT 1.0 1.1 0.7 0.4  AST 22 12* 12* 15  ALT 26 19 15 15   ALKPHOS 68 54 56 55  PROT 6.5 6.2* 6.3* 6.7  ALBUMIN 2.3* 2.1* 2.2* 2.2*     Assessment and Plan:  CT today demonstrates further reduction and inflammation in the right lower quadrant at the level of the appendix and cecum with no further significant thickening of the appendix demonstrated. At the level of the percutaneous drain, no further abscess is identified.  Previously noted small fluid collection deep to the right abdominal wall and anterior to the gallbladder has resolved.  There still remains some output of thin, yellowish and cloudy fluid.  Output is approximately 10 to 15 mL/day when accounting for flushing.  Given borderline fluid output for catheter removal, I recommended that Julie Richards decrease flushing to once daily and we leave the drain in for one more week.  I would anticipate that the drain could be removed next week.  She does not need another CT scan.  I will see her back on 08/13/2018.  Electronically Signed: Azzie Roup 08/06/2018, 2:22 PM   I spent a  total of 15 Minutes in face to face in clinical consultation, greater than 50% of which was counseling/coordinating care for appendiceal abscess drainage.

## 2018-08-10 ENCOUNTER — Other Ambulatory Visit: Payer: Self-pay | Admitting: Surgery

## 2018-08-10 DIAGNOSIS — K3533 Acute appendicitis with perforation and localized peritonitis, with abscess: Secondary | ICD-10-CM

## 2018-08-11 ENCOUNTER — Other Ambulatory Visit: Payer: Medicare Other

## 2018-08-13 ENCOUNTER — Encounter: Payer: Self-pay | Admitting: *Deleted

## 2018-08-13 ENCOUNTER — Ambulatory Visit
Admission: RE | Admit: 2018-08-13 | Discharge: 2018-08-13 | Disposition: A | Discharge status: Home / Self Care | Payer: Medicare Other | Source: Ambulatory Visit | Attending: Surgery | Admitting: Surgery

## 2018-08-13 ENCOUNTER — Other Ambulatory Visit: Payer: Self-pay

## 2018-08-13 DIAGNOSIS — K3533 Acute appendicitis with perforation and localized peritonitis, with abscess: Secondary | ICD-10-CM

## 2018-08-13 HISTORY — PX: IR RADIOLOGIST EVAL & MGMT: IMG5224

## 2018-08-13 NOTE — Progress Notes (Signed)
Chief Complaint: Status post percutaneous catheter drainage of appendiceal abscess.  History of Present Illness: Julie Richards is a 68 y.o. female with perforated appendicitis resulting in progressive abscess formation and status post percutaneous catheter drainage on 07/20/2018 with placement of a 12 French drain.  The patient was seen last week and decision made to leave the percutaneous drain and for at least another week due to output of approximately 10 to 15 mL/day of yellowish, cloudy fluid.  Flushing was decreased to 5 mL/day and over the last week output from the drain has been approximately 8-12 mL/day and averaging approximately 10 mL/day which includes the 5 mL flush.  She remains asymptomatic and denies fever or abdominal pain.  No past medical history on file.  Past Surgical History:  Procedure Laterality Date   IR RADIOLOGIST EVAL & MGMT  08/06/2018   IR RADIOLOGIST EVAL & MGMT  08/13/2018   LAPAROSCOPIC GASTRIC BANDING      Allergies: Percocet [oxycodone-acetaminophen]  Medications: Prior to Admission medications   Medication Sig Start Date End Date Taking? Authorizing Provider  acetaminophen (TYLENOL) 325 MG tablet Take 2 tablets (650 mg total) by mouth every 6 (six) hours as needed for mild pain. Patient taking differently: Take 325 mg by mouth every 6 (six) hours as needed for mild pain.  07/16/18   Rayburn, Alphonsus SiasKelly A, PA-C  Cholecalciferol (VITAMIN D3 PO) Take 1 capsule by mouth daily.     [provider]  docusate sodium (COLACE) 100 MG capsule Take 100 mg by mouth daily as needed for mild constipation (bloatin).    [provider]  ibuprofen (ADVIL) 400 MG tablet Take 1 tablet (400 mg total) by mouth every 6 (six) hours as needed for moderate pain. 07/16/18   Rayburn, Alphonsus SiasKelly A, PA-C  Multiple Vitamin (MULTIVITAMIN WITH MINERALS) TABS tablet Take 1 tablet by mouth daily.    [provider]  ondansetron (ZOFRAN-ODT) 4 MG disintegrating  tablet Take 1 tablet (4 mg total) by mouth every 6 (six) hours as needed for nausea. 07/27/18   Focht, Joyce CopaJessica L, PA  OVER THE COUNTER MEDICATION Take 1 capsule by mouth daily. Mega 4     [provider]  traMADol (ULTRAM) 50 MG tablet Take 1-2 tablets (50-100 mg total) by mouth every 6 (six) hours as needed for moderate pain or severe pain. 07/16/18   Rayburn, Alphonsus SiasKelly A, PA-C     No family history on file.  Social History   Socioeconomic History   Marital status: Widowed    Spouse name: Not on file   Number of children: Not on file   Years of education: Not on file   Highest education level: Not on file  Occupational History   Not on file  Social Needs   Financial resource strain: Not on file   Food insecurity    Worry: Not on file    Inability: Not on file   Transportation needs    Medical: Not on file    Non-medical: Not on file  Tobacco Use   Smoking status: Never Smoker   Smokeless tobacco: Never Used  Substance and Sexual Activity   Alcohol use: Never    Frequency: Never   Drug use: Never   Sexual activity: Not on file  Lifestyle   Physical activity    Days per week: Not on file    Minutes per session: Not on file   Stress: Not on file  Relationships   Social connections  Talks on phone: Not on file    Gets together: Not on file    Attends religious service: Not on file    Active member of club or organization: Not on file    Attends meetings of clubs or organizations: Not on file    Relationship status: Not on file  Other Topics Concern   Not on file  Social History Narrative   Not on file     Review of Systems: A 12 point ROS discussed and pertinent positives are indicated in the HPI above.  All other systems are negative.  Review of Systems  Constitutional: Negative.   Respiratory: Negative.   Cardiovascular: Negative.   Gastrointestinal: Negative.   Genitourinary: Negative.   Musculoskeletal: Negative.   Neurological:  Negative.     Vital Signs: BP (!) 155/85 (BP Location: Right Arm)    Pulse 80    Temp (!) 97.2 F (36.2 C)    SpO2 100%   Physical Exam Vitals signs reviewed.  Constitutional:      General: She is not in acute distress.    Appearance: Normal appearance. She is not ill-appearing, toxic-appearing or diaphoretic.  Abdominal:     General: There is no distension.     Palpations: Abdomen is soft.     Tenderness: There is no abdominal tenderness. There is no guarding or rebound.  Neurological:     Mental Status: She is alert.     Imaging: Ct Abdomen Pelvis W Contrast  Result Date: 08/06/2018 CLINICAL DATA:  Perforated appendicitis with appendiceal abscess and status post percutaneous catheter drainage abscess on 07/20/2018. Recent follow-up study demonstrated resolution the appendiceal abscess with some residual inflammation present as well as a tiny fluid collection just deep to the right abdominal wall and anterior to the gallbladder. EXAM: CT ABDOMEN AND PELVIS WITH CONTRAST TECHNIQUE: Multidetector CT imaging of the abdomen and pelvis was performed using the standard protocol following bolus administration of intravenous contrast. CONTRAST:  147mL ISOVUE-300 IOPAMIDOL (ISOVUE-300) INJECTION 61% COMPARISON:  07/25/2018 and additional prior scans. FINDINGS: Lower chest: No acute abnormality. Hepatobiliary: Stable cyst in posterior right lobe of the liver. The gallbladder is contracted and unremarkable. No biliary ductal dilatation. Pancreas: Unremarkable. No pancreatic ductal dilatation or surrounding inflammatory changes. Spleen: Normal in size without focal abnormality. Adrenals/Urinary Tract: Adrenal glands are unremarkable. Kidneys are normal, without renal calculi, focal lesion, or hydronephrosis. Bladder is unremarkable. Stomach/Bowel: Bowel shows no evidence of obstruction, ileus or significant inflammation. No free air identified. Inflammatory thickening adjacent to the cecum and appendix  in the right lower quadrant has improved significantly since the prior scan with little to no further acute appearing inflammation present. Stable appearance of gastric band. The appendix is very difficult to discretely identify but likely lies very close to the percutaneous drain. The appendix is not visibly grossly enlarged or thickened. There is no evidence of a visible calcified appendicolith. Vascular/Lymphatic: No significant vascular findings are present. No enlarged abdominal or pelvic lymph nodes. Reproductive: Uterus and bilateral adnexa are unremarkable. Other: The tiny fluid collection deep to the right abdominal wall and fairly close to the gallbladder has resolved since the prior scan. No new abscess identified. At the level of the percutaneous drain just medial to the cecum and adjacent to the appendix, no further fluid collection is identified. There is no evidence of extraluminal air or new abscess in this region. Musculoskeletal: No acute or significant osseous findings. IMPRESSION: 1. Resolved appendiceal abscess. Significant reduction in inflammation adjacent to the  cecum and involving the appendix. The appendix is not visibly thickened and there is no evidence of an appendicolith. 2. Resolved small fluid collection deep to the right abdominal wall and anterior to the gallbladder on the prior scan 12 days ago. No new fluid collections identified. Electronically Signed   By: Irish LackGlenn  Lavoris Sparling M.D.   On: 08/06/2018 14:08   Ct Abdomen Pelvis W Contrast  Result Date: 07/25/2018 CLINICAL DATA:  Inpatient. Perforated appendicitis status post percutaneous drainage. Persistent abdominal pain and fever. EXAM: CT ABDOMEN AND PELVIS WITH CONTRAST TECHNIQUE: Multidetector CT imaging of the abdomen and pelvis was performed using the standard protocol following bolus administration of intravenous contrast. CONTRAST:  100mL OMNIPAQUE IOHEXOL 300 MG/ML  SOLN COMPARISON:  07/18/2018 CT abdomen/pelvis. FINDINGS:  Lower chest: Mild-to-moderate passive atelectasis at the dependent lung bases bilaterally. Hepatobiliary: Normal liver size. Posterior right liver lobe 1.7 cm cyst. Additional scattered subcentimeter hypodense right liver lesions are too small to characterize and are unchanged. No new liver lesions. Mild diffuse gallbladder wall thickening, new. No gallbladder distention. No radiopaque cholelithiasis. No biliary ductal dilatation. Pancreas: Normal, with no mass or duct dilation. Spleen: Normal size. No mass. Adrenals/Urinary Tract: Stable adrenal glands without discrete adrenal nodules. Nonobstructive 6 mm upper right renal stone. No hydronephrosis. No renal masses. Normal bladder. Stomach/Bowel: Stable gastric band in the gastric cardia region with intact tubing connecting to the subcutaneous ventral right abdominal wall port. Stomach is nondistended and without acute abnormality. Normal caliber small bowel with no small bowel wall thickening. The periappendiceal abscess has resolved status post percutaneous drainage, with ill-defined fat stranding surrounding the pigtail tip of percutaneous drain, with no residual measurable collection. Stable reactive wall thickening in the cecum. Oral contrast transits to the colon. Large bowel is relatively collapsed. Few scattered fluid levels in the left colon. Reactive wall thickening in the sigmoid colon adjacent to cecum in the right lower quadrant, similar. No diverticulosis. Vascular/Lymphatic: Normal caliber abdominal aorta. Patent portal, splenic, hepatic and renal veins. No pathologically enlarged lymph nodes in the abdomen or pelvis. Reproductive: Mildly enlarged myomatous uterus with coarsely calcified anterior uterine body fibroid. No adnexal masses. Other: New tiny thick-walled fluid collection in the anterior right abdomen anterior to the gallbladder fundus (series 3/image 33). No pneumoperitoneum. No ascites. Musculoskeletal: No aggressive appearing focal  osseous lesions. Mild thoracolumbar spondylosis. IMPRESSION: 1. Periappendiceal right lower quadrant abscess has resolved status post percutaneous drainage. 2. New tiny thick-walled fluid collection in the anterior right abdomen anterior to the gallbladder fundus, which could represent a new tiny abscess, which is probably too small to drain. 3. Nonspecific new mild diffuse gallbladder wall thickening, potentially reactive. No radiopaque cholelithiasis. No gallbladder distention. 4. Stable reactive wall thickening in the cecum and sigmoid colon. 5. Mild-to-moderate bibasilar atelectasis. Electronically Signed   By: Delbert PhenixJason A Poff M.D.   On: 07/25/2018 19:27   Ct Abdomen Pelvis W Contrast  Result Date: 07/18/2018 CLINICAL DATA:  Abdominal pain and nausea and vomiting for 10 days. Perforated appendicitis with abscess. EXAM: CT ABDOMEN AND PELVIS WITH CONTRAST TECHNIQUE: Multidetector CT imaging of the abdomen and pelvis was performed using the standard protocol following bolus administration of intravenous contrast. CONTRAST:  100mL OMNIPAQUE IOHEXOL 300 MG/ML  SOLN COMPARISON:  07/13/2018 FINDINGS: Lower Chest: No acute findings. Hepatobiliary: No hepatic masses identified. Small right hepatic lobe cysts remains stable. Gallbladder is unremarkable. Pancreas:  No mass or inflammatory changes. Spleen: Within normal limits in size and appearance. Adrenals/Urinary Tract: No masses identified. Small right  renal calculi again seen no evidence of ureteral calculi or hydronephrosis. Stomach/Bowel: Gastric lap band again seen in place. Increased size of fluid and gas collection in right lower quadrant with surrounding inflammatory changes. This measures 9.5 x 6.6 cm, compared to 4.7 x 4.2 cm previously. Associated wall thickening of the cecum and ascending colon is again demonstrated. These findings are consistent with ruptured appendicitis with enlarging periappendiceal abscess. Vascular/Lymphatic: No pathologically  enlarged lymph nodes. No abdominal aortic aneurysm. Reproductive: Stable small uterine fibroids measuring up to 2 cm. No adnexal mass or free pelvic fluid. Other:  None. Musculoskeletal:  No suspicious bone lesions identified. IMPRESSION: 1. Ruptured appendicitis, with increased size of periappendiceal abscess no measuring 9.5 cm 2. Stable right nephrolithiasis, without ureteral calculi or hydronephrosis. 3. Stable small uterine fibroids. Electronically Signed   By: Danae OrleansJohn A Stahl M.D.   On: 07/18/2018 18:23   Dg Chest Portable 1 View  Result Date: 07/18/2018 CLINICAL DATA:  Fever EXAM: PORTABLE CHEST 1 VIEW COMPARISON:  None. FINDINGS: Cardiomegaly. Pulmonary vascular prominence without focal airspace opacity or overt edema. The visualized skeletal structures are unremarkable. IMPRESSION: Cardiomegaly. Pulmonary vascular prominence without focal airspace opacity or overt edema. Electronically Signed   By: Lauralyn PrimesAlex  Bibbey M.D.   On: 07/18/2018 17:23   Ct Image Guided Drainage By Percutaneous Catheter  Result Date: 07/20/2018 INDICATION: 68 year old female with ruptured appendicitis and right lower quadrant peri appendiceal abscess. She presents for CT-guided drain placement. EXAM: CT-guided drain placement MEDICATIONS: The patient is currently admitted to the hospital and receiving intravenous antibiotics. The antibiotics were administered within an appropriate time frame prior to the initiation of the procedure. ANESTHESIA/SEDATION: Fentanyl 100 mcg IV; Versed 2 mg IV Moderate Sedation Time:  23 minutes The patient was continuously monitored during the procedure by the interventional radiology nurse under my direct supervision. COMPLICATIONS: None immediate. PROCEDURE: Informed written consent was obtained from the patient after a thorough discussion of the procedural risks, benefits and alternatives. All questions were addressed. A timeout was performed prior to the initiation of the procedure. A planning axial  CT scan was performed. The fluid and gas collection in the right lower quadrant adjacent to the appendix was successfully identified. A suitable skin entry site was selected and marked. The overlying skin was sterilely prepped and draped in the standard fashion using chlorhexidine skin prep. Local anesthesia was attained by infiltration with 1% lidocaine. Under intermittent CT guidance, an 18 gauge trocar needle was carefully advanced through the right lower quadrant anterior abdominal wall and into the fluid and gas collection. An Amplatz wire was then coiled within the collection. A small dermatotomy was made. The skin tract was dilated to 12 [place]. A Cook 12 French drainage catheter was then advanced over the wire and formed in the fluid collection. Aspiration yields nearly 200 mL of thick, foul-smelling purulent fluid. A sample was sent for Gram stain and culture. Additional CT imaging confirms excellent placement of the drainage catheter and near-total resolution of the abscess cavity. The abscess cavity was then lavaged with sterile saline in the drain connected to JP bulb suction. The drain was then secured to the skin with 0 Prolene suture. Sterile bandages were applied. IMPRESSION: Technically successful placement of a 12 French drainage catheter into the right lower quadrant peri-appendiceal abscess yielding approximately 200 mL of thick, foul-smelling purulent fluid. PLAN: 1. Maintain drain to JP bulb suction. 2. Flush at least once per shift. 3. Follow-up in drain clinic with CT scan of the abdomen and  pelvis and drain injection in 2 weeks. Signed, Sterling Big, MD, RPVI [facility] Electronically Signed   By: Malachy Moan M.D.   On: 07/20/2018 15:00   [place] Ekg Site Rite  Result Date: 07/23/2018 If Site Rite image not attached, placement could not be confirmed due to current cardiac rhythm.  Ir Radiologist Eval & Mgmt  Result  Date: 08/13/2018 Please refer to notes tab for details about interventional procedure. (Op Note)  Ir Radiologist Eval & Mgmt  Result Date: 08/06/2018 Please refer to notes tab for details about interventional procedure. (Op Note)   Labs:  CBC: Recent Labs    07/21/18 0818 07/22/18 0156 07/23/18 0530 07/24/18 0340  WBC 14.5* 13.1* 10.5 9.0  HGB 11.5* 11.6* 11.8* 11.6*  HCT 35.7* 35.3* 35.9* 35.4*  PLT 317 316 344 317    COAGS: Recent Labs    07/19/18 0818  INR 1.2    BMP: Recent Labs    07/21/18 0818 07/22/18 0156 07/23/18 0530 07/24/18 0340 07/25/18 0336  NA 136 136 136 137  --   K 3.2* 3.5 3.1* 3.2*  --   CL 103 102 100 101  --   CO2 --   GLUCOSE 147* 124* 109* 109*  --   BUN 8 11 7* 7*  --   CALCIUM 8.2* 8.3* 8.5* 8.5*  --   CREATININE 0.86 0.82 0.80 0.78 0.74  GFRNONAA >60 >60 >60 >60 >60  GFRAA >60 >60 >60 >60 >60    LIVER FUNCTION TESTS: Recent Labs    07/20/18 0256 07/21/18 0818 07/22/18 0156 07/23/18 0530  BILITOT 1.0 1.1 0.7 0.4  AST 22 12* 12* 15  ALT ALKPHOS 68 54 56 55  PROT 6.5 6.2* 6.3* 6.7  ALBUMIN 2.3* 2.1* 2.2* 2.2*     Assessment and Plan:  The appendiceal abscess drain was removed today without difficulty and in its entirety.  A dressing was applied over the exit site.  The patient was given instructions for exit site wound care.  She has a surgical follow-up appointment with Dr. Luisa Hart on 08/17/2018.   Electronically Signed: Reola Calkins 08/13/2018, 9:14 AM     I spent a total of 10 Minutes in face to face in clinical consultation, greater than 50% of which was counseling/coordinating care for management of an appendiceal abscess drain.

## 2018-08-25 ENCOUNTER — Inpatient Hospital Stay (HOSPITAL_COMMUNITY)
Admission: EM | Admit: 2018-08-25 | Discharge: 2018-09-17 | DRG: 329 | Disposition: A | Discharge status: Skilled Nursing Facility | Payer: Medicare Other | Attending: General Surgery | Admitting: General Surgery

## 2018-08-25 ENCOUNTER — Emergency Department (HOSPITAL_COMMUNITY): Payer: Medicare Other

## 2018-08-25 ENCOUNTER — Encounter (HOSPITAL_COMMUNITY): Payer: Self-pay | Admitting: Emergency Medicine

## 2018-08-25 ENCOUNTER — Other Ambulatory Visit: Payer: Self-pay

## 2018-08-25 DIAGNOSIS — Y92239 Unspecified place in hospital as the place of occurrence of the external cause: Secondary | ICD-10-CM | POA: Diagnosis not present

## 2018-08-25 DIAGNOSIS — Z79899 Other long term (current) drug therapy: Secondary | ICD-10-CM

## 2018-08-25 DIAGNOSIS — K651 Peritoneal abscess: Secondary | ICD-10-CM

## 2018-08-25 DIAGNOSIS — Z9884 Bariatric surgery status: Secondary | ICD-10-CM

## 2018-08-25 DIAGNOSIS — L988 Other specified disorders of the skin and subcutaneous tissue: Secondary | ICD-10-CM | POA: Diagnosis not present

## 2018-08-25 DIAGNOSIS — Z885 Allergy status to narcotic agent status: Secondary | ICD-10-CM

## 2018-08-25 DIAGNOSIS — K567 Ileus, unspecified: Secondary | ICD-10-CM | POA: Diagnosis not present

## 2018-08-25 DIAGNOSIS — B9620 Unspecified Escherichia coli [E. coli] as the cause of diseases classified elsewhere: Secondary | ICD-10-CM | POA: Diagnosis present

## 2018-08-25 DIAGNOSIS — T8143XA Infection following a procedure, organ and space surgical site, initial encounter: Secondary | ICD-10-CM | POA: Diagnosis present

## 2018-08-25 DIAGNOSIS — Z20828 Contact with and (suspected) exposure to other viral communicable diseases: Secondary | ICD-10-CM | POA: Diagnosis present

## 2018-08-25 DIAGNOSIS — Z6841 Body Mass Index (BMI) 40.0 and over, adult: Secondary | ICD-10-CM

## 2018-08-25 DIAGNOSIS — K3533 Acute appendicitis with perforation and localized peritonitis, with abscess: Secondary | ICD-10-CM | POA: Diagnosis not present

## 2018-08-25 DIAGNOSIS — K5900 Constipation, unspecified: Secondary | ICD-10-CM | POA: Diagnosis not present

## 2018-08-25 DIAGNOSIS — Y838 Other surgical procedures as the cause of abnormal reaction of the patient, or of later complication, without mention of misadventure at the time of the procedure: Secondary | ICD-10-CM | POA: Diagnosis not present

## 2018-08-25 DIAGNOSIS — E43 Unspecified severe protein-calorie malnutrition: Secondary | ICD-10-CM | POA: Diagnosis present

## 2018-08-25 DIAGNOSIS — E876 Hypokalemia: Secondary | ICD-10-CM | POA: Diagnosis not present

## 2018-08-25 DIAGNOSIS — L0291 Cutaneous abscess, unspecified: Secondary | ICD-10-CM

## 2018-08-25 LAB — LIPASE, BLOOD: Lipase: 24 U/L (ref 11–51)

## 2018-08-25 LAB — URINALYSIS, ROUTINE W REFLEX MICROSCOPIC
Bacteria, UA: NONE SEEN
Bilirubin Urine: NEGATIVE
Glucose, UA: NEGATIVE mg/dL
Ketones, ur: NEGATIVE mg/dL
Nitrite: NEGATIVE
Protein, ur: 30 mg/dL — AB
Specific Gravity, Urine: 1.029 (ref 1.005–1.030)
WBC, UA: >50 WBC/hpf — ABNORMAL HIGH (ref 0–5)
pH: 5 (ref 5.0–8.0)

## 2018-08-25 LAB — COMPREHENSIVE METABOLIC PANEL
ALT: 24 U/L (ref 0–44)
AST: 18 U/L (ref 15–41)
Albumin: 3.3 g/dL — ABNORMAL LOW (ref 3.5–5.0)
Alkaline Phosphatase: 88 U/L (ref 38–126)
Anion gap: 9 (ref 5–15)
BUN: 12 mg/dL (ref 8–23)
CO2: 26 mmol/L (ref 22–32)
Calcium: 9.1 mg/dL (ref 8.9–10.3)
Chloride: 103 mmol/L (ref 98–111)
Creatinine, Ser: 0.89 mg/dL (ref 0.44–1.00)
GFR calc Af Amer: >60 mL/min (ref >60)
GFR calc non Af Amer: >60 mL/min (ref >60)
Glucose, Bld: 119 mg/dL — ABNORMAL HIGH (ref 70–99)
Potassium: 4.1 mmol/L (ref 3.5–5.1)
Sodium: 138 mmol/L (ref 135–145)
Total Bilirubin: 1.6 mg/dL — ABNORMAL HIGH (ref 0.3–1.2)
Total Protein: 7.1 g/dL (ref 6.5–8.1)

## 2018-08-25 LAB — CBC
HCT: 41.9 % (ref 36.0–46.0)
Hemoglobin: 13.1 g/dL (ref 12.0–15.0)
MCH: 27.2 pg (ref 26.0–34.0)
MCHC: 31.3 g/dL (ref 30.0–36.0)
MCV: 86.9 fL (ref 80.0–100.0)
Platelets: 203 10*3/uL (ref 150–400)
RBC: 4.82 MIL/uL (ref 3.87–5.11)
RDW: 15.5 % (ref 11.5–15.5)
WBC: 15.5 10*3/uL — ABNORMAL HIGH (ref 4.0–10.5)
nRBC: 0 % (ref 0.0–0.2)

## 2018-08-25 MED ORDER — ONDANSETRON 4 MG PO TBDP: 4.0000 mg | ORAL_TABLET | Freq: Four times a day (QID) | ORAL | Status: DC | PRN

## 2018-08-25 MED ORDER — TRAMADOL HCL 50 MG PO TABS
50.0000 mg | ORAL_TABLET | Freq: Four times a day (QID) | ORAL | Status: DC | PRN
  Administered 2018-08-26 – 2018-08-27 (×2): 50 mg via ORAL
  Filled 2018-08-25 (×2): qty 1

## 2018-08-25 MED ORDER — SODIUM CHLORIDE 0.9% FLUSH
10.0000 mL | Freq: Two times a day (BID) | INTRAVENOUS | Status: DC
  Administered 2018-08-26 – 2018-09-16 (×18): 10 mL

## 2018-08-25 MED ORDER — METOPROLOL TARTRATE 5 MG/5ML IV SOLN: 5.0000 mg | Freq: Four times a day (QID) | INTRAVENOUS | Status: DC | PRN

## 2018-08-25 MED ORDER — SODIUM CHLORIDE 0.9% FLUSH
3.0000 mL | Freq: Once | INTRAVENOUS | Status: AC
  Administered 2018-09-02: 22:00:00 3 mL via INTRAVENOUS

## 2018-08-25 MED ORDER — METHOCARBAMOL 500 MG PO TABS
500.0000 mg | ORAL_TABLET | Freq: Four times a day (QID) | ORAL | Status: DC | PRN
  Administered 2018-08-26 – 2018-08-27 (×2): 500 mg via ORAL
  Filled 2018-08-25 (×2): qty 1

## 2018-08-25 MED ORDER — ACETAMINOPHEN 325 MG PO TABS
650.0000 mg | ORAL_TABLET | Freq: Four times a day (QID) | ORAL | Status: DC | PRN
  Administered 2018-08-27 – 2018-08-28 (×2): 650 mg via ORAL
  Filled 2018-08-25 (×2): qty 2

## 2018-08-25 MED ORDER — ONDANSETRON HCL 4 MG/2ML IJ SOLN
4.0000 mg | Freq: Four times a day (QID) | INTRAMUSCULAR | Status: DC | PRN
  Administered 2018-08-26 – 2018-09-10 (×3): 4 mg via INTRAVENOUS
  Filled 2018-08-25 (×3): qty 2

## 2018-08-25 MED ORDER — SODIUM CHLORIDE 0.9% FLUSH
10.0000 mL | INTRAVENOUS | Status: DC | PRN
  Administered 2018-08-30: 15:00:00 10 mL
  Filled 2018-08-25: qty 40

## 2018-08-25 MED ORDER — SODIUM CHLORIDE 0.9 % IV SOLN
2.0000 g | Freq: Once | INTRAVENOUS | Status: AC
  Administered 2018-08-25: 22:00:00 2 g via INTRAVENOUS
  Filled 2018-08-25: qty 2

## 2018-08-25 MED ORDER — DIPHENHYDRAMINE HCL 12.5 MG/5ML PO ELIX: 12.5000 mg | ORAL_SOLUTION | Freq: Four times a day (QID) | ORAL | Status: DC | PRN

## 2018-08-25 MED ORDER — BISACODYL 10 MG RE SUPP
10.0000 mg | Freq: Every day | RECTAL | Status: DC | PRN
  Administered 2018-09-08: 15:00:00 10 mg via RECTAL
  Filled 2018-08-25: qty 1

## 2018-08-25 MED ORDER — HYDRALAZINE HCL 20 MG/ML IJ SOLN: 10.0000 mg | INTRAMUSCULAR | Status: DC | PRN

## 2018-08-25 MED ORDER — DIPHENHYDRAMINE HCL 50 MG/ML IJ SOLN
12.5000 mg | Freq: Four times a day (QID) | INTRAMUSCULAR | Status: DC | PRN
  Administered 2018-08-26 (×2): 12.5 mg via INTRAVENOUS
  Filled 2018-08-25 (×2): qty 1

## 2018-08-25 MED ORDER — HYDROMORPHONE HCL 1 MG/ML IJ SOLN
0.5000 mg | INTRAMUSCULAR | Status: DC | PRN
  Administered 2018-08-26 – 2018-08-28 (×8): 0.5 mg via INTRAVENOUS
  Filled 2018-08-25 (×8): qty 1

## 2018-08-25 MED ORDER — METRONIDAZOLE IN NACL 5-0.79 MG/ML-% IV SOLN
500.0000 mg | Freq: Once | INTRAVENOUS | Status: AC
  Administered 2018-08-25: 22:00:00 500 mg via INTRAVENOUS
  Filled 2018-08-25: qty 100

## 2018-08-25 MED ORDER — PIPERACILLIN-TAZOBACTAM 3.375 G IVPB
3.3750 g | Freq: Three times a day (TID) | INTRAVENOUS | Status: DC
  Administered 2018-08-26 – 2018-09-12 (×51): 3.375 g via INTRAVENOUS
  Filled 2018-08-25 (×51): qty 50

## 2018-08-25 MED ORDER — ACETAMINOPHEN 650 MG RE SUPP: 650.0000 mg | Freq: Four times a day (QID) | RECTAL | Status: DC | PRN

## 2018-08-25 MED ORDER — DOCUSATE SODIUM 100 MG PO CAPS
100.0000 mg | ORAL_CAPSULE | Freq: Two times a day (BID) | ORAL | Status: DC
  Administered 2018-08-26 – 2018-09-14 (×31): 100 mg via ORAL
  Filled 2018-08-25 (×37): qty 1

## 2018-08-25 MED ORDER — SODIUM CHLORIDE 0.9 % IV SOLN
INTRAVENOUS | Status: DC
  Administered 2018-08-25 – 2018-09-08 (×7): via INTRAVENOUS

## 2018-08-25 MED ORDER — IOHEXOL 300 MG/ML  SOLN
100.0000 mL | Freq: Once | INTRAMUSCULAR | Status: AC | PRN
  Administered 2018-08-25: 20:00:00 100 mL via INTRAVENOUS

## 2018-08-25 NOTE — ED Notes (Signed)
Called CT to let them know pt has a line and can come to CT.

## 2018-08-25 NOTE — ED Triage Notes (Signed)
States had tube removed from RLQ abdominal area due to having an infection. States yesterday developed RLQ dull achy. Spoke with doctor sent to the ED for evaluation. Denies nausea, emesis, diarrhea.

## 2018-08-25 NOTE — ED Provider Notes (Signed)
[facility]ENC[facility] Provider Note   CSN: [id number] Arrival date & time: 08/25/18  1259    History   Chief Complaint Chief Complaint  Patient presents with  . Abdominal Pain    HPI Julie Richards is a 68 y.o. female.     HPI Pt has history of a ruptured appendix.  Pt was treated with a drain.   SHe had the drain removed  On 7/23.  Last night she started to have pain in her right lower abdomen.  She did not have a fever but she began feeling chilled again.  She called her doctor and was told to come to the ED.    No vomiting.  NO diarrhea.   History reviewed. No pertinent past medical history.  Patient Active Problem List   Diagnosis Date Noted  . Abscess of abdominal cavity (HCC) 07/18/2018  . Perforated appendicitis 07/09/2018    Past Surgical History:  Procedure Laterality Date  . IR RADIOLOGIST EVAL & MGMT  08/06/2018  . IR RADIOLOGIST EVAL & MGMT  08/13/2018  . LAPAROSCOPIC GASTRIC BANDING       OB History   No obstetric history on file.      Home Medications    Prior to Admission medications   Medication Sig Start Date End Date Taking? Authorizing Provider  acetaminophen (TYLENOL) 325 MG tablet Take 2 tablets (650 mg total) by mouth every 6 (six) hours as needed for mild pain. Patient taking differently: Take 325 mg by mouth every 6 (six) hours as needed for mild pain.  07/16/18   Rayburn, Alphonsus SiasKelly A, PA-C  Cholecalciferol (VITAMIN D3 PO) Take 1 capsule by mouth daily.     [provider]  docusate sodium (COLACE) 100 MG capsule Take 100 mg by mouth daily as needed for mild constipation (bloatin).    [provider]  ibuprofen (ADVIL) 400 MG tablet Take 1 tablet (400 mg total) by mouth every 6 (six) hours as needed for moderate pain. 07/16/18   Rayburn, Alphonsus SiasKelly A, PA-C  Multiple Vitamin (MULTIVITAMIN WITH MINERALS) TABS tablet Take 1 tablet by mouth daily.    [provider]  ondansetron (ZOFRAN-ODT) 4 MG  disintegrating tablet Take 1 tablet (4 mg total) by mouth every 6 (six) hours as needed for nausea. 07/27/18   Focht, Joyce CopaJessica L, PA  OVER THE COUNTER MEDICATION Take 1 capsule by mouth daily. Mega 4     [provider]  traMADol (ULTRAM) 50 MG tablet Take 1-2 tablets (50-100 mg total) by mouth every 6 (six) hours as needed for moderate pain or severe pain. 07/16/18   Rayburn, Alphonsus SiasKelly A, PA-C    Family History No family history on file.  Social History Social History   Tobacco Use  . Smoking status: Never Smoker  . Smokeless tobacco: Never Used  Substance Use Topics  . Alcohol use: Never    Frequency: Never  . Drug use: Never     Allergies   Percocet [oxycodone-acetaminophen]   Review of Systems Review of Systems  All other systems reviewed and are negative.    Physical Exam Updated Vital Signs BP 136/76   Pulse 87   Temp 98.4 F (36.9 C)   Resp 18   Ht 1.753 m (5\' 9" )   Wt 135 kg   SpO2 100%   BMI 43.95 kg/m   Physical Exam Vitals signs and nursing note reviewed.  Constitutional:      General: She is not in acute distress.  Appearance: She is well-developed.  HENT:     Head: Normocephalic and atraumatic.     Right Ear: External ear normal.     Left Ear: External ear normal.  Eyes:     General: No scleral icterus.       Right eye: No discharge.        Left eye: No discharge.     Conjunctiva/sclera: Conjunctivae normal.  Neck:     Musculoskeletal: Neck supple.     Trachea: No tracheal deviation.  Cardiovascular:     Rate and Rhythm: Normal rate and regular rhythm.  Pulmonary:     Effort: Pulmonary effort is normal. No respiratory distress.     Breath sounds: Normal breath sounds. No stridor. No wheezing or rales.  Abdominal:     General: Bowel sounds are normal. There is no distension.     Palpations: Abdomen is soft.     Tenderness: There is abdominal tenderness in the right lower quadrant. There is no guarding or rebound.  Musculoskeletal:         General: No tenderness.  Skin:    General: Skin is warm and dry.     Findings: No rash.  Neurological:     Mental Status: She is alert.     Cranial Nerves: No cranial nerve deficit (no facial droop, extraocular movements intact, no slurred speech).     Sensory: No sensory deficit.     Motor: No abnormal muscle tone or seizure activity.     Coordination: Coordination normal.      ED Treatments / Results  Labs (all labs ordered are listed, but only abnormal results are displayed) Labs Reviewed  COMPREHENSIVE METABOLIC PANEL - Abnormal; Notable for the following components:      Result Value   Glucose, Bld 119 (*)    Albumin 3.3 (*)    Total Bilirubin 1.6 (*)    All other components within normal limits  CBC - Abnormal; Notable for the following components:   WBC 15.5 (*)    All other components within normal limits  URINALYSIS, ROUTINE W REFLEX MICROSCOPIC - Abnormal; Notable for the following components:   Color, Urine AMBER (*)    APPearance CLOUDY (*)    Hgb urine dipstick MODERATE (*)    Protein, ur 30 (*)    Leukocytes,Ua LARGE (*)    WBC, UA >50 (*)    Non Squamous Epithelial 0-5 (*)    All other components within normal limits  SARS CORONAVIRUS 2  LIPASE, BLOOD     Radiology Ct Abdomen Pelvis W Contrast  Result Date: 08/25/2018 CLINICAL DATA:  Right lower quadrant pain. Peritonitis. EXAM: CT ABDOMEN AND PELVIS WITH CONTRAST TECHNIQUE: Multidetector CT imaging of the abdomen and pelvis was performed using the standard protocol following bolus administration of intravenous contrast. CONTRAST:  138mL OMNIPAQUE IOHEXOL 300 MG/ML  SOLN COMPARISON:  August 06, 2018 FINDINGS: Lower chest: No acute abnormality. Hepatobiliary: 1.7 cm cyst in the right lobe of the liver. At least 2 other too small to be actually characterized hypoattenuated lesions in the right lobe of the liver. Normal appearance of the gallbladder. Pancreas: Unremarkable. No pancreatic ductal dilatation or  surrounding inflammatory changes. Spleen: Normal in size without focal abnormality. Adrenals/Urinary Tract: Normal adrenal glands. Normal left kidney, ureters and urinary bladder. 8 mm nonobstructive calculus in the midpole region of the right kidney. Stomach/Bowel: Gastric band in stable position. No evidence of small-bowel obstruction. There is marked asymmetric circumferential thickening of the ascending colon with  adjacent gas and fluid collection measuring 5.4 by 4.5 by 5.3 cm. Surrounding mesenteric stranding. Vascular/Lymphatic: No significant vascular findings are present. No enlarged abdominal or pelvic lymph nodes. Reproductive: Fibroid uterus. Normal adnexa. Other: No abdominal wall hernia or abnormality. No abdominopelvic ascites. Musculoskeletal: No acute or significant osseous findings. IMPRESSION: 1. Recurrent marked asymmetric circumferential thickening of the ascending colon with adjacent gas and fluid collection measuring 5.4 x 4.5 x 5.3 cm. 2. At least 2 too small to be actually characterized hypoattenuated lesions in the right lobe of the liver. 3. 8 mm nonobstructive right renal calculus. 4. Gastric band in stable position. 5. Fibroid uterus. Electronically Signed   By: Ted Mcalpineobrinka  Dimitrova M.D.   On: 08/25/2018 20:49    Procedures Procedures (including critical care time)  Medications Ordered in ED Medications  sodium chloride flush (NS) 0.9 % injection 3 mL (3 mLs Intravenous Not Given 08/25/18 2005)  sodium chloride flush (NS) 0.9 % injection 10-40 mL (has no administration in time range)  sodium chloride flush (NS) 0.9 % injection 10-40 mL (has no administration in time range)  ceFEPIme (MAXIPIME) 2 g in sodium chloride 0.9 % 100 mL IVPB (has no administration in time range)    And  metroNIDAZOLE (FLAGYL) IVPB 500 mg (has no administration in time range)  iohexol (OMNIPAQUE) 300 MG/ML solution 100 mL (100 mLs Intravenous Contrast Given 08/25/18 2022)     Initial Impression /  Assessment and Plan / ED Course  I have reviewed the triage vital signs and the nursing notes.  Pertinent labs & imaging results that were available during my care of the patient were reviewed by me and considered in my medical decision making (see chart for details).   Patient presented with recurrent abdominal pain after recent treatment for ruptured appendix with abscess.  Patient's laboratory test showed an elevation of her white blood cell count.  Repeat CT scan unfortunately confirms a recurrent abscess formation.  Patient's urinalysis suggests infection but I suspect this is related to ureteral inflammation from the abdominal abscess.  IV antibiotics have been ordered.  I spoke with Dr. Doylene Canardonner, general surgery.  Plan on admission for further treatment.  She anticipates recurrent IR drain placement.  Final Clinical Impressions(s) / ED Diagnoses   Final diagnoses:  Abdominal visceral abscess [facility](HCC)      Linwood DibblesKnapp, Onie Hayashi, MD 08/25/18 2113

## 2018-08-25 NOTE — H&P (Signed)
Surgical H&P  CC: Abdominal pain  HPI: This is a very pleasant 68 year old woman who was just discharged from our service 1 month ago with a percutaneous drain in place for treatment of perforated appendicitis.  She had been doing well with minimal drain output and the drain was removed and IR clinic on July 23.  She followed up with Dr. Brantley Stage the following week and was doing well, however couple days ago began to have what she describes as a stitch in the right side and then persistent right lower quadrant pain beginning yesterday.  This was associated with chills.  Denies any change in appetite, nausea, emesis or diarrhea.  Allergies  Allergen Reactions  . Percocet [Oxycodone-Acetaminophen] Nausea And Vomiting    History reviewed. No pertinent past medical history.  Past Surgical History:  Procedure Laterality Date  . IR RADIOLOGIST EVAL & MGMT  08/06/2018  . IR RADIOLOGIST EVAL & MGMT  08/13/2018  . LAPAROSCOPIC GASTRIC BANDING      No family history on file.  Social History   Socioeconomic History  . Marital status: Widowed    Spouse name: Not on file  . Number of children: Not on file  . Years of education: Not on file  . Highest education level: Not on file  Occupational History  . Not on file  Social Needs  . Financial resource strain: Not on file  . Food insecurity    Worry: Not on file    Inability: Not on file  . Transportation needs    Medical: Not on file    Non-medical: Not on file  Tobacco Use  . Smoking status: Never Smoker  . Smokeless tobacco: Never Used  Substance and Sexual Activity  . Alcohol use: Never    Frequency: Never  . Drug use: Never  . Sexual activity: Not on file  Lifestyle  . Physical activity    Days per week: Not on file    Minutes per session: Not on file  . Stress: Not on file  Relationships  . Social Herbalist on phone: Not on file    Gets together: Not on file    Attends religious service: Not on file    Active  member of club or organization: Not on file    Attends meetings of clubs or organizations: Not on file    Relationship status: Not on file  Other Topics Concern  . Not on file  Social History Narrative  . Not on file    No current facility-administered medications on file prior to encounter.    Current Outpatient Medications on File Prior to Encounter  Medication Sig Dispense Refill  . acetaminophen (TYLENOL) 325 MG tablet Take 2 tablets (650 mg total) by mouth every 6 (six) hours as needed for mild pain. (Patient taking differently: Take 325 mg by mouth every 6 (six) hours as needed for mild pain. )    . Cholecalciferol (VITAMIN D3 PO) Take 1 capsule by mouth daily.     Marland Kitchen docusate sodium (COLACE) 100 MG capsule Take 100 mg by mouth daily as needed for mild constipation (bloatin).    Marland Kitchen ibuprofen (ADVIL) 400 MG tablet Take 1 tablet (400 mg total) by mouth every 6 (six) hours as needed for moderate pain. 30 tablet 0  . Multiple Vitamin (MULTIVITAMIN WITH MINERALS) TABS tablet Take 1 tablet by mouth daily.    . ondansetron (ZOFRAN-ODT) 4 MG disintegrating tablet Take 1 tablet (4 mg total) by mouth every  6 (six) hours as needed for nausea. 15 tablet 0  . OVER THE COUNTER MEDICATION Take 1 capsule by mouth daily. Mega 4     . traMADol (ULTRAM) 50 MG tablet Take 1-2 tablets (50-100 mg total) by mouth every 6 (six) hours as needed for moderate pain or severe pain. 30 tablet 1    Review of Systems: a complete, 10pt review of systems was completed with pertinent positives and negatives as documented in the HPI  Physical Exam: Vitals:   08/25/18 1915 08/25/18 2036  BP:  136/76  Pulse: 87   Resp:    Temp:    SpO2: 100%    Gen: A&Ox3, no distress  Head: normocephalic, atraumatic Eyes: extraocular motions intact, anicteric.  Neck: supple without mass or thyromegaly Chest: unlabored respirations, symmetrical air entry, clear bilaterally   Cardiovascular: RRR with palpable distal pulses, no  pedal edema Abdomen: soft, obese, focally tender in the right lower quadrant. No mass or organomegaly.  Extremities: warm, without edema, no deformities  Neuro: grossly intact Psych: appropriate mood and affect, normal insight  Skin: warm and dry   CBC Latest Ref Rng & Units 08/25/2018 07/24/2018 07/23/2018  WBC 4.0 - 10.5 K/uL 15.5(H) 9.0 10.5  Hemoglobin 12.0 - 15.0 g/dL 40.913.1 11.6(L) 11.8(L)  Hematocrit 36.0 - 46.0 % 41.9 35.4(L) 35.9(L)  Platelets 150 - 400 K/uL 203 317 344    CMP Latest Ref Rng & Units 08/25/2018 07/25/2018 07/24/2018  Glucose 70 - 99 mg/dL 811(B119(H) - 147(W109(H)  BUN 8 - 23 mg/dL 12 - 7(L)  Creatinine 2.950.44 - 1.00 mg/dL 6.210.89 3.080.74 6.570.78  Sodium 135 - 145 mmol/L 138 - 137  Potassium 3.5 - 5.1 mmol/L 4.1 - 3.2(L)  Chloride 98 - 111 mmol/L 103 - 101  CO2 22 - 32 mmol/L 26 - 27  Calcium 8.9 - 10.3 mg/dL 9.1 - 8.5(L)  Total Protein 6.5 - 8.1 g/dL 7.1 - -  Total Bilirubin 0.3 - 1.2 mg/dL 8.4(O1.6(H) - -  Alkaline Phos 38 - 126 U/L 88 - -  AST 15 - 41 U/L 18 - -  ALT 0 - 44 U/L 24 - -    Lab Results  Component Value Date   INR 1.2 07/19/2018    Imaging: Ct Abdomen Pelvis W Contrast  Result Date: 08/25/2018 CLINICAL DATA:  Right lower quadrant pain. Peritonitis. EXAM: CT ABDOMEN AND PELVIS WITH CONTRAST TECHNIQUE: Multidetector CT imaging of the abdomen and pelvis was performed using the standard protocol following bolus administration of intravenous contrast. CONTRAST:  100mL OMNIPAQUE IOHEXOL 300 MG/ML  SOLN COMPARISON:  August 06, 2018 FINDINGS: Lower chest: No acute abnormality. Hepatobiliary: 1.7 cm cyst in the right lobe of the liver. At least 2 other too small to be actually characterized hypoattenuated lesions in the right lobe of the liver. Normal appearance of the gallbladder. Pancreas: Unremarkable. No pancreatic ductal dilatation or surrounding inflammatory changes. Spleen: Normal in size without focal abnormality. Adrenals/Urinary Tract: Normal adrenal glands. Normal left kidney,  ureters and urinary bladder. 8 mm nonobstructive calculus in the midpole region of the right kidney. Stomach/Bowel: Gastric band in stable position. No evidence of small-bowel obstruction. There is marked asymmetric circumferential thickening of the ascending colon with adjacent gas and fluid collection measuring 5.4 by 4.5 by 5.3 cm. Surrounding mesenteric stranding. Vascular/Lymphatic: No significant vascular findings are present. No enlarged abdominal or pelvic lymph nodes. Reproductive: Fibroid uterus. Normal adnexa. Other: No abdominal wall hernia or abnormality. No abdominopelvic ascites. Musculoskeletal: No acute or significant  osseous findings. IMPRESSION: 1. Recurrent marked asymmetric circumferential thickening of the ascending colon with adjacent gas and fluid collection measuring 5.4 x 4.5 x 5.3 cm. 2. At least 2 too small to be actually characterized hypoattenuated lesions in the right lobe of the liver. 3. 8 mm nonobstructive right renal calculus. 4. Gastric band in stable position. 5. Fibroid uterus. Electronically Signed   By: Ted Mcalpineobrinka  Dimitrova M.D.   On: 08/25/2018 20:49     A/P: Recurrent 5cm right lower quadrant intra-abdominal abscess status post removal of percutaneous drain nearly 2 weeks ago.  Also noted circumferential thickening of the ascending colon.  Plan admission for symptom control, empiric antibiotics, and will request IR evaluation for possible replacement of drain.  I discussed the anatomy and disease process with the patient, as she asked why we could not go in and just remove her appendix.  I discussed with ongoing inflammation for the last 6 weeks, an attempt at surgical treatment would most likely end up being ileocecectomy, high risk of conversion to open surgery, risk of ileus, risk of leak, risk of ongoing abscess.  We discussed that if the drain cannot be placed and symptoms do not resolve with antibiotics, that would be the last resort of treatment.   Phylliss Blakeshelsea  Zaki Gertsch, MD [facility], [place] Pager [phone]

## 2018-08-25 NOTE — ED Notes (Signed)
Two unsuccessful IV attempts. Consult in, pt required [place] and had a PICC recently

## 2018-08-26 ENCOUNTER — Encounter (HOSPITAL_COMMUNITY): Payer: Self-pay | Admitting: General Practice

## 2018-08-26 ENCOUNTER — Inpatient Hospital Stay (HOSPITAL_COMMUNITY): Payer: Medicare Other

## 2018-08-26 DIAGNOSIS — T8143XA Infection following a procedure, organ and space surgical site, initial encounter: Secondary | ICD-10-CM | POA: Diagnosis present

## 2018-08-26 DIAGNOSIS — Z79899 Other long term (current) drug therapy: Secondary | ICD-10-CM | POA: Diagnosis not present

## 2018-08-26 DIAGNOSIS — R1084 Generalized abdominal pain: Secondary | ICD-10-CM | POA: Diagnosis not present

## 2018-08-26 DIAGNOSIS — K651 Peritoneal abscess: Secondary | ICD-10-CM | POA: Diagnosis present

## 2018-08-26 DIAGNOSIS — K383 Fistula of appendix: Secondary | ICD-10-CM | POA: Diagnosis not present

## 2018-08-26 DIAGNOSIS — Z9884 Bariatric surgery status: Secondary | ICD-10-CM | POA: Diagnosis not present

## 2018-08-26 DIAGNOSIS — Y92239 Unspecified place in hospital as the place of occurrence of the external cause: Secondary | ICD-10-CM | POA: Diagnosis not present

## 2018-08-26 DIAGNOSIS — Z20828 Contact with and (suspected) exposure to other viral communicable diseases: Secondary | ICD-10-CM | POA: Diagnosis present

## 2018-08-26 DIAGNOSIS — Z6841 Body Mass Index (BMI) 40.0 and over, adult: Secondary | ICD-10-CM | POA: Diagnosis not present

## 2018-08-26 DIAGNOSIS — E43 Unspecified severe protein-calorie malnutrition: Secondary | ICD-10-CM | POA: Diagnosis present

## 2018-08-26 DIAGNOSIS — K3533 Acute appendicitis with perforation and localized peritonitis, with abscess: Secondary | ICD-10-CM | POA: Diagnosis present

## 2018-08-26 DIAGNOSIS — K5900 Constipation, unspecified: Secondary | ICD-10-CM | POA: Diagnosis not present

## 2018-08-26 DIAGNOSIS — B9620 Unspecified Escherichia coli [E. coli] as the cause of diseases classified elsewhere: Secondary | ICD-10-CM | POA: Diagnosis present

## 2018-08-26 DIAGNOSIS — E876 Hypokalemia: Secondary | ICD-10-CM | POA: Diagnosis not present

## 2018-08-26 DIAGNOSIS — K567 Ileus, unspecified: Secondary | ICD-10-CM | POA: Diagnosis not present

## 2018-08-26 DIAGNOSIS — G4700 Insomnia, unspecified: Secondary | ICD-10-CM | POA: Diagnosis not present

## 2018-08-26 DIAGNOSIS — Z885 Allergy status to narcotic agent status: Secondary | ICD-10-CM | POA: Diagnosis not present

## 2018-08-26 DIAGNOSIS — Y838 Other surgical procedures as the cause of abnormal reaction of the patient, or of later complication, without mention of misadventure at the time of the procedure: Secondary | ICD-10-CM | POA: Diagnosis not present

## 2018-08-26 DIAGNOSIS — L988 Other specified disorders of the skin and subcutaneous tissue: Secondary | ICD-10-CM | POA: Diagnosis not present

## 2018-08-26 LAB — BASIC METABOLIC PANEL
Anion gap: 10 (ref 5–15)
BUN: 12 mg/dL (ref 8–23)
CO2: 24 mmol/L (ref 22–32)
Calcium: 8.7 mg/dL — ABNORMAL LOW (ref 8.9–10.3)
Chloride: 102 mmol/L (ref 98–111)
Creatinine, Ser: 0.79 mg/dL (ref 0.44–1.00)
GFR calc Af Amer: >60 mL/min (ref >60)
GFR calc non Af Amer: >60 mL/min (ref >60)
Glucose, Bld: 121 mg/dL — ABNORMAL HIGH (ref 70–99)
Potassium: 3.7 mmol/L (ref 3.5–5.1)
Sodium: 136 mmol/L (ref 135–145)

## 2018-08-26 LAB — CBC
HCT: 39.9 % (ref 36.0–46.0)
Hemoglobin: 12.6 g/dL (ref 12.0–15.0)
MCH: 26.9 pg (ref 26.0–34.0)
MCHC: 31.6 g/dL (ref 30.0–36.0)
MCV: 85.1 fL (ref 80.0–100.0)
Platelets: 180 10*3/uL (ref 150–400)
RBC: 4.69 MIL/uL (ref 3.87–5.11)
RDW: 15.5 % (ref 11.5–15.5)
WBC: 14.5 10*3/uL — ABNORMAL HIGH (ref 4.0–10.5)
nRBC: 0 % (ref 0.0–0.2)

## 2018-08-26 LAB — SARS CORONAVIRUS 2 (TAT 6-24 HRS): SARS Coronavirus 2: NEGATIVE

## 2018-08-26 LAB — APTT: aPTT: 31 seconds (ref 24–36)

## 2018-08-26 LAB — PROTIME-INR
INR: 1.4 — ABNORMAL HIGH (ref 0.8–1.2)
Prothrombin Time: 16.8 seconds — ABNORMAL HIGH (ref 11.4–15.2)

## 2018-08-26 MED ORDER — MIDAZOLAM HCL 2 MG/2ML IJ SOLN
INTRAMUSCULAR | Status: AC | PRN
  Administered 2018-08-26 (×2): 1 mg via INTRAVENOUS

## 2018-08-26 MED ORDER — FENTANYL CITRATE (PF) 100 MCG/2ML IJ SOLN
INTRAMUSCULAR | Status: AC | PRN
  Administered 2018-08-26: 50 ug via INTRAVENOUS

## 2018-08-26 MED ORDER — SODIUM CHLORIDE 0.9% FLUSH
5.0000 mL | Freq: Three times a day (TID) | INTRAVENOUS | Status: DC
  Administered 2018-08-26 – 2018-09-16 (×34): 5 mL

## 2018-08-26 MED ORDER — FENTANYL CITRATE (PF) 100 MCG/2ML IJ SOLN
INTRAMUSCULAR | Status: AC
  Filled 2018-08-26: qty 2

## 2018-08-26 MED ORDER — SODIUM CHLORIDE 0.9 % IV SOLN
INTRAVENOUS | Status: AC | PRN
  Administered 2018-08-26: 10 mL/h via INTRAVENOUS

## 2018-08-26 MED ORDER — MIDAZOLAM HCL 2 MG/2ML IJ SOLN
INTRAMUSCULAR | Status: AC
  Filled 2018-08-26: qty 2

## 2018-08-26 NOTE — Procedures (Signed)
Interventional Radiology Procedure Note  Procedure: Ct guided abscess drain.  80F into peri-colonic abscess near ruptured appendix.   Complications: None Recommendations:  - TO bulb suction - Do not submerge - Routine drain care   Signed,  Dulcy Fanny. Earleen Newport, DO

## 2018-08-26 NOTE — ED Notes (Signed)
Patient placed on hospital bed for comfort °

## 2018-08-26 NOTE — ED Notes (Signed)
Family at bedside. 

## 2018-08-26 NOTE — Progress Notes (Signed)
Pt transported to IR for poss. Drain placement.

## 2018-08-26 NOTE — Progress Notes (Signed)
Orthopedic Tech Progress Note Patient Details:  Julie Richards [birth year] [id number]  Patient ID: Arnette Schaumann, female   DOB: 08/09/1950, 68 y.o.   MRN: [id number]   [place] Pink 08/26/2018, 8:59 AMCheck Carter pillow,  Reposition patient in device.

## 2018-08-26 NOTE — ED Notes (Signed)
Report given to Ginger, RN

## 2018-08-26 NOTE — Progress Notes (Signed)
Subjective: CC: Abdominal pain Patient reports she is sleepy from pain medication. Pain is improved from yesterday. No N/V. Currently NPO. No BM in 2 days.   Objective: Vital signs in last 24 hours: Temp:  [98.4 F (36.9 C)-98.9 F (37.2 C)] 98.4 F (36.9 C) (08/04 1548) Pulse Rate:  [80-105] 85 (08/05 0640) Resp:  [16-22] 19 (08/05 0359) BP: (97-136)/(65-90) 112/81 (08/05 0639) SpO2:  [95 %-100 %] 95 % (08/05 0640) Weight:  [135 kg] 135 kg (08/04 1327)    Intake/Output from previous day: 08/04 0701 - 08/05 0700 In: 200 [IV Piggyback:200] Out: -  Intake/Output this shift: No intake/output data recorded.  PE: Gen:  Alert, NAD, pleasant Card:  RRR Pulm:  CTA b/l Abd: Soft, ND, tenderness in the right lower quadrant. No r/r/g. No peritonitis.  Ext:  No edema Psych: A&Ox3  Skin: no rashes noted, warm and dry  Lab Results:  Recent Labs    08/25/18 1329 08/26/18 0200  WBC 15.5* 14.5*  HGB 13.1 12.6  HCT 41.9 39.9  PLT 203 180   BMET Recent Labs    08/25/18 1329 08/26/18 0200  NA 138 136  K 4.1 3.7  CL 103 102  CO2 26 24  GLUCOSE 119* 121*  BUN 12 12  CREATININE 0.89 0.79  CALCIUM 9.1 8.7*   PT/INR Recent Labs    08/26/18 0200  LABPROT 16.8*  INR 1.4*   CMP     Component Value Date/Time   NA 136 08/26/2018 0200   K 3.7 08/26/2018 0200   CL 102 08/26/2018 0200   CO2 24 08/26/2018 0200   GLUCOSE 121 (H) 08/26/2018 0200   BUN 12 08/26/2018 0200   CREATININE 0.79 08/26/2018 0200   CALCIUM 8.7 (L) 08/26/2018 0200   PROT 7.1 08/25/2018 1329   ALBUMIN 3.3 (L) 08/25/2018 1329   AST 18 08/25/2018 1329   ALT 24 08/25/2018 1329   ALKPHOS 88 08/25/2018 1329   BILITOT 1.6 (H) 08/25/2018 1329   GFRNONAA >60 08/26/2018 0200   GFRAA >60 08/26/2018 0200   Lipase     Component Value Date/Time   LIPASE 24 08/25/2018 1329       Studies/Results: Ct Abdomen Pelvis W Contrast  Result Date: 08/25/2018 CLINICAL DATA:  Right lower quadrant pain.  Peritonitis. EXAM: CT ABDOMEN AND PELVIS WITH CONTRAST TECHNIQUE: Multidetector CT imaging of the abdomen and pelvis was performed using the standard protocol following bolus administration of intravenous contrast. CONTRAST:  179mL OMNIPAQUE IOHEXOL 300 MG/ML  SOLN COMPARISON:  August 06, 2018 FINDINGS: Lower chest: No acute abnormality. Hepatobiliary: 1.7 cm cyst in the right lobe of the liver. At least 2 other too small to be actually characterized hypoattenuated lesions in the right lobe of the liver. Normal appearance of the gallbladder. Pancreas: Unremarkable. No pancreatic ductal dilatation or surrounding inflammatory changes. Spleen: Normal in size without focal abnormality. Adrenals/Urinary Tract: Normal adrenal glands. Normal left kidney, ureters and urinary bladder. 8 mm nonobstructive calculus in the midpole region of the right kidney. Stomach/Bowel: Gastric band in stable position. No evidence of small-bowel obstruction. There is marked asymmetric circumferential thickening of the ascending colon with adjacent gas and fluid collection measuring 5.4 by 4.5 by 5.3 cm. Surrounding mesenteric stranding. Vascular/Lymphatic: No significant vascular findings are present. No enlarged abdominal or pelvic lymph nodes. Reproductive: Fibroid uterus. Normal adnexa. Other: No abdominal wall hernia or abnormality. No abdominopelvic ascites. Musculoskeletal: No acute or significant osseous findings. IMPRESSION: 1. Recurrent marked asymmetric  circumferential thickening of the ascending colon with adjacent gas and fluid collection measuring 5.4 x 4.5 x 5.3 cm. 2. At least 2 too small to be actually characterized hypoattenuated lesions in the right lobe of the liver. 3. 8 mm nonobstructive right renal calculus. 4. Gastric band in stable position. 5. Fibroid uterus. Electronically Signed   By: Ted Mcalpineobrinka  Dimitrova M.D.   On: 08/25/2018 20:49    Anti-infectives: Anti-infectives (From admission, onward)   Start      Dose/Rate Route Frequency Ordered Stop   08/26/18 0600  piperacillin-tazobactam (ZOSYN) IVPB 3.375 g     3.375 g 12.5 mL/hr over 240 Minutes Intravenous Every 8 hours 08/25/18 2141     08/25/18 2115  ceFEPIme (MAXIPIME) 2 g in sodium chloride 0.9 % 100 mL IVPB     2 g 200 mL/hr over 30 Minutes Intravenous  Once 08/25/18 2102 08/25/18 2229   08/25/18 2115  metroNIDAZOLE (FLAGYL) IVPB 500 mg     500 mg 100 mL/hr over 60 Minutes Intravenous  Once 08/25/18 2102 08/25/18 2257       Assessment/Plan Hx of gastric banding  Recurrent 5cm right lower quadrant intra-abdominal abscess secondary to perforated appendicitis  - Admitted 6/18 for perforated appendicitis. Tx with abx  - Readmitted 6/27 with right lower quadrant intra-abdominal abscess - IR drain placement 6/29. IR drain removed 7/23.  - CT 8/4 recurrent right lower quadrant abscess measuring 5.4 x 4.5 x 5.3 cm  - Continue IV abx - Will request IR evaluation for possible replacement of drain. - We discussed with ongoing inflammation for the last 6 weeks, an attempt at surgical treatment would most likely end up being ileocecectomy, high risk of conversion to open surgery, risk of ileus, risk of leak, risk of ongoing abscess.  We discussed that if the drain cannot be placed and symptoms do not resolve with antibiotics, surgery would be the last resort of treatment.  FEN - NPO VTE - SCDs ID - Zosyn. WBC 14.5   LOS: 0 days    Jacinto HalimMichael M Soliyana Mcchristian , [facility] 08/26/2018, 7:36 AM Pager: [phone]

## 2018-08-27 NOTE — Progress Notes (Signed)
Referring Physician(s): Dr. Kae Heller  Supervising Physician: Corrie Mckusick  Patient Status:  [facility] - In-pt  Chief Complaint: Follow up RLQ drain placed 08/26/18 by Dr. Earleen Newport  Subjective:  Patient sitting up in bed getting vital signs taken, breakfast tray at bedside. She reports pain has improved from yesterday, is minimal with medications. She reports good appetite without n/v, no BM yet.   Allergies: Percocet [oxycodone-acetaminophen]  Medications: Prior to Admission medications   Medication Sig Start Date End Date Taking? Authorizing Provider  Cholecalciferol (VITAMIN D3 PO) Take 1 capsule by mouth daily.    Yes [provider]  Melatonin 5 MG TABS Take 5 mg by mouth every evening. 07/27/18  Yes [provider]  Multiple Vitamin (MULTIVITAMIN WITH MINERALS) TABS tablet Take 1 tablet by mouth daily.   Yes [provider]  OVER THE COUNTER MEDICATION Take 1 capsule by mouth daily. Mega Red   Yes [provider]  Potassium 99 MG TABS Take 1 tablet by mouth daily.   Yes [provider]  acetaminophen (TYLENOL) 325 MG tablet Take 2 tablets (650 mg total) by mouth every 6 (six) hours as needed for mild pain. Patient not taking: Reported on 08/25/2018 07/16/18   Rayburn, Claiborne Billings A, PA-C  ibuprofen (ADVIL) 400 MG tablet Take 1 tablet (400 mg total) by mouth every 6 (six) hours as needed for moderate pain. Patient not taking: Reported on 08/25/2018 07/16/18   Rayburn, Claiborne Billings A, PA-C  ondansetron (ZOFRAN-ODT) 4 MG disintegrating tablet Take 1 tablet (4 mg total) by mouth every 6 (six) hours as needed for nausea. Patient not taking: Reported on 08/25/2018 07/27/18   Kalman Drape, PA  traMADol (ULTRAM) 50 MG tablet Take 1-2 tablets (50-100 mg total) by mouth every 6 (six) hours as needed for moderate pain or severe pain. Patient not taking: Reported on 08/25/2018 07/16/18   Rayburn, Floyce Stakes, PA-C     Vital Signs: BP 135/89 (BP Location: Right Arm)   Pulse  (!) 119   Temp 99.4 F (37.4 C) (Oral)   Resp 17   Ht 5\' 9"  (1.753 m)   Wt 297 lb 9.9 oz (135 kg)   SpO2 93%   BMI 43.95 kg/m   Physical Exam Vitals signs and nursing note reviewed.  Constitutional:      General: She is not in acute distress. HENT:     Head: Normocephalic.  Cardiovascular:     Rate and Rhythm: Tachycardia present.  Pulmonary:     Effort: Pulmonary effort is normal.  Abdominal:     General: There is no distension.     Palpations: Abdomen is soft.     Tenderness: There is abdominal tenderness (diffuse; mild to palpation).     Comments: (+) RLQ drain to suction - bulb inverted on exam, changed to appropriate configuration on exam. ~25 cc bloody output in suction bulb. Insertion site clean, dry, dressed appropriately without discharge, bleeding or leakage from site. Mildly tender to palpation. Suture and stat lock in tact.  Skin:    General: Skin is warm and dry.  Neurological:     Mental Status: She is alert and oriented to person, place, and time.  Psychiatric:        Mood and Affect: Mood normal.        Behavior: Behavior normal.        Thought Content: Thought content normal.        Judgment: Judgment normal.     Imaging: Ct Abdomen  Pelvis W Contrast  Result Date: 08/25/2018 CLINICAL DATA:  Right lower quadrant pain. Peritonitis. EXAM: CT ABDOMEN AND PELVIS WITH CONTRAST TECHNIQUE: Multidetector CT imaging of the abdomen and pelvis was performed using the standard protocol following bolus administration of intravenous contrast. CONTRAST:  100mL OMNIPAQUE IOHEXOL 300 MG/ML  SOLN COMPARISON:  August 06, 2018 FINDINGS: Lower chest: No acute abnormality. Hepatobiliary: 1.7 cm cyst in the right lobe of the liver. At least 2 other too small to be actually characterized hypoattenuated lesions in the right lobe of the liver. Normal appearance of the gallbladder. Pancreas: Unremarkable. No pancreatic ductal dilatation or surrounding inflammatory changes. Spleen: Normal in  size without focal abnormality. Adrenals/Urinary Tract: Normal adrenal glands. Normal left kidney, ureters and urinary bladder. 8 mm nonobstructive calculus in the midpole region of the right kidney. Stomach/Bowel: Gastric band in stable position. No evidence of small-bowel obstruction. There is marked asymmetric circumferential thickening of the ascending colon with adjacent gas and fluid collection measuring 5.4 by 4.5 by 5.3 cm. Surrounding mesenteric stranding. Vascular/Lymphatic: No significant vascular findings are present. No enlarged abdominal or pelvic lymph nodes. Reproductive: Fibroid uterus. Normal adnexa. Other: No abdominal wall hernia or abnormality. No abdominopelvic ascites. Musculoskeletal: No acute or significant osseous findings. IMPRESSION: 1. Recurrent marked asymmetric circumferential thickening of the ascending colon with adjacent gas and fluid collection measuring 5.4 x 4.5 x 5.3 cm. 2. At least 2 too small to be actually characterized hypoattenuated lesions in the right lobe of the liver. 3. 8 mm nonobstructive right renal calculus. 4. Gastric band in stable position. 5. Fibroid uterus. Electronically Signed   By: Ted Mcalpineobrinka  Dimitrova M.D.   On: 08/25/2018 20:49   Ct Image Guided Fluid Drain By Catheter  Result Date: 08/27/2018 INDICATION: 68 year old female with a history ruptured appendicitis and recurrent abscess after drain removal EXAM: CT-GUIDED DRAINAGE RIGHT LOWER QUADRANT MEDICATIONS: The patient is currently admitted to the hospital and receiving intravenous antibiotics. The antibiotics were administered within an appropriate time frame prior to the initiation of the procedure. ANESTHESIA/SEDATION: Fentanyl 100 mcg IV; Versed .  2.0 mg IV Moderate Sedation Time:  17 minutes The patient was continuously monitored during the procedure by the interventional radiology nurse under my direct supervision. COMPLICATIONS: None PROCEDURE: Informed written consent was obtained from the  patient after a thorough discussion of the procedural risks, benefits and alternatives. All questions were addressed. Maximal Sterile Barrier Technique was utilized including caps, mask, sterile gowns, sterile gloves, sterile drape, hand hygiene and skin antiseptic. A timeout was performed prior to the initiation of the procedure. Patient positioned supine position on CT gantry table. Scout CT was acquired for planning purposes. Using modified Seldinger technique, a 10 French drain was placed into periappendiceal abscess in the right lower quadrant. Aspiration of approximately 60 cc of purulent material was performed for culture. Drain was attached to bulb suction and sutured in position. Patient tolerated the procedure well and remained hemodynamically stable throughout. No complications were encountered and no significant blood loss. IMPRESSION: Status post CT-guided right lower quadrant drain. Signed, Yvone NeuJaime S. Reyne DumasWagner, DO, RPVI [facility] Electronically Signed   By: Gilmer MorJaime  Wagner D.O.   On: 08/27/2018 07:27    Labs:  CBC: Recent Labs    07/23/18 0530 07/24/18 0340 08/25/18 1329 08/26/18 0200  WBC 10.5 9.0 15.5* 14.5*  HGB 11.8* 11.6* 13.1 12.6  HCT 35.9* 35.4* 41.9 39.9  PLT 344 317 203 180    COAGS: Recent Labs  07/19/18 0818 08/26/18 0200  INR 1.2 1.4*  APTT  --  31    BMP: Recent Labs    07/23/18 0530 07/24/18 0340 07/25/18 0336 08/25/18 1329 08/26/18 0200  NA 136 137  --  138 136  K 3.1* 3.2*  --  4.1 3.7  CL 100 101  --  103 102  CO2 25 27  --  26 24  GLUCOSE 109* 109*  --  119* 121*  BUN 7* 7*  --  12 12  CALCIUM 8.5* 8.5*  --  9.1 8.7*  CREATININE 0.80 0.78 0.74 0.89 0.79  GFRNONAA >60 >60 >60 >60 >60  GFRAA >60 >60 >60 >60 >60    LIVER FUNCTION TESTS: Recent Labs    07/21/18 0818 07/22/18 0156 07/23/18 0530 08/25/18 1329  BILITOT 1.1 0.7 0.4 1.6*  AST 12* 12* 15 18  ALT 19 15 15 24    ALKPHOS 54 56 55 88  PROT 6.2* 6.3* 6.7 7.1  ALBUMIN 2.1* 2.2* 2.2* 3.3*    Assessment and Plan:  68 y/o F with history of ruptured appendix and recurrent abdominal abscess s/p replacement of RLQ drain yesterday by Dr. Loreta AveWagner.   Patient reports pain has improved, drain appears to be functioning well on my exam today - insertion site is unremarkable. Suction bulb was inverted on my exam and was changed to appropriate configuration. Per I/O 10 cc recorded since placement, ~25 cc thin, bloody output present on my exam today.   Continue TID flushes with 3-5 cc NS, record output once daily, routine dressing changes. Will need repeat imaging/possible injection once output <10 cc in 24H.  IR will continue to follow. Please call with questions or concerns.   Electronically Signed: Villa HerbShannon A Watterson, PA-C 08/27/2018, 9:01 AM   I spent a total of 15 Minutes at the the patient's bedside AND on the patient's hospital floor or unit, greater than 50% of which was counseling/coordinating care for RLQ abdominal abscess drain follow up.

## 2018-08-27 NOTE — H&P (Signed)
Chief Complaint: Recurrent abscess  Referring Physician(s): CONNOR, CHELSEA A  Supervising Physician: Corrie Mckusick  Patient Status: [facility] - In-pt  History of Present Illness: Julie Richards is a 68 y.o. female who is known to our service.  She had a drain placed back on 07/20/2018 by Dr. Laurence Ferrari for ruptured appendix with abscess.  She was seen by Dr. Kathlene Cote in clinic on 08/13/18 and there was resolution of the abscess so the drain was removed.  Unfortunately she developed RLQ pain again and came to the ED for evaluation.   CT scan showed = Recurrent marked asymmetric circumferential thickening of the ascending colon with adjacent gas and fluid collection measuring 5.4 x 4.5 x 5.3 cm.  She is going to need another drain today.  She is NPO.  History reviewed. No pertinent past medical history.  Past Surgical History:  Procedure Laterality Date   IR RADIOLOGIST EVAL & MGMT  08/06/2018   IR RADIOLOGIST EVAL & MGMT  08/13/2018   LAPAROSCOPIC GASTRIC BANDING      Allergies: Percocet [oxycodone-acetaminophen]  Medications: Prior to Admission medications   Medication Sig Start Date End Date Taking? Authorizing Provider  Cholecalciferol (VITAMIN D3 PO) Take 1 capsule by mouth daily.    Yes [provider]  Melatonin 5 MG TABS Take 5 mg by mouth every evening. 07/27/18  Yes [provider]  Multiple Vitamin (MULTIVITAMIN WITH MINERALS) TABS tablet Take 1 tablet by mouth daily.   Yes [provider]  OVER THE COUNTER MEDICATION Take 1 capsule by mouth daily. Mega Red   Yes [provider]  Potassium 99 MG TABS Take 1 tablet by mouth daily.   Yes [provider]  acetaminophen (TYLENOL) 325 MG tablet Take 2 tablets (650 mg total) by mouth every 6 (six) hours as needed for mild pain. Patient not taking: Reported on 08/25/2018 07/16/18   Rayburn, Claiborne Billings A, PA-C  ibuprofen (ADVIL) 400 MG tablet Take 1 tablet (400 mg total) by  mouth every 6 (six) hours as needed for moderate pain. Patient not taking: Reported on 08/25/2018 07/16/18   Rayburn, Claiborne Billings A, PA-C  ondansetron (ZOFRAN-ODT) 4 MG disintegrating tablet Take 1 tablet (4 mg total) by mouth every 6 (six) hours as needed for nausea. Patient not taking: Reported on 08/25/2018 07/27/18   Kalman Drape, PA  traMADol (ULTRAM) 50 MG tablet Take 1-2 tablets (50-100 mg total) by mouth every 6 (six) hours as needed for moderate pain or severe pain. Patient not taking: Reported on 08/25/2018 07/16/18   Rayburn, Floyce Stakes, PA-C     History reviewed. No pertinent family history.  Social History   Socioeconomic History   Marital status: Widowed    Spouse name: Not on file   Number of children: Not on file   Years of education: Not on file   Highest education level: Not on file  Occupational History   Not on file  Social Needs   Financial resource strain: Not on file   Food insecurity    Worry: Not on file    Inability: Not on file   Transportation needs    Medical: Not on file    Non-medical: Not on file  Tobacco Use   Smoking status: Never Smoker   Smokeless tobacco: Never Used  Substance and Sexual Activity   Alcohol use: Never    Frequency: Never   Drug use: Never   Sexual activity: Not on file  Lifestyle   Physical activity  Days per week: Not on file    Minutes per session: Not on file   Stress: Not on file  Relationships   Social connections    Talks on phone: Not on file    Gets together: Not on file    Attends religious service: Not on file    Active member of club or organization: Not on file    Attends meetings of clubs or organizations: Not on file    Relationship status: Not on file  Other Topics Concern   Not on file  Social History Narrative   Not on file     Review of Systems: A 12 point ROS discussed and pertinent positives are indicated in the HPI above.  All other systems are negative.  Review of Systems  Vital  Signs: BP 135/89 (BP Location: Right Arm)    Pulse (!) 119    Temp 99.4 F (37.4 C) (Oral)    Resp 17    Ht 5\' 9"  (1.753 m)    Wt 135 kg    SpO2 93%    BMI 43.95 kg/m   Physical Exam Vitals signs reviewed.  Constitutional:      Appearance: Normal appearance.  HENT:     Head: Normocephalic and atraumatic.  Eyes:     Extraocular Movements: Extraocular movements intact.  Neck:     Musculoskeletal: Normal range of motion.  Cardiovascular:     Rate and Rhythm: Regular rhythm. Tachycardia present.  Pulmonary:     Effort: Pulmonary effort is normal.     Breath sounds: Normal breath sounds.  Abdominal:     Palpations: Abdomen is soft.     Tenderness: There is abdominal tenderness.  Musculoskeletal: Normal range of motion.  Skin:    General: Skin is warm and dry.  Neurological:     General: No focal deficit present.     Mental Status: She is alert and oriented to person, place, and time.  Psychiatric:        Mood and Affect: Mood normal.        Behavior: Behavior normal.        Thought Content: Thought content normal.        Judgment: Judgment normal.     Imaging: Ct Abdomen Pelvis W Contrast  Result Date: 08/25/2018 CLINICAL DATA:  Right lower quadrant pain. Peritonitis. EXAM: CT ABDOMEN AND PELVIS WITH CONTRAST TECHNIQUE: Multidetector CT imaging of the abdomen and pelvis was performed using the standard protocol following bolus administration of intravenous contrast. CONTRAST:  100mL OMNIPAQUE IOHEXOL 300 MG/ML  SOLN COMPARISON:  August 06, 2018 FINDINGS: Lower chest: No acute abnormality. Hepatobiliary: 1.7 cm cyst in the right lobe of the liver. At least 2 other too small to be actually characterized hypoattenuated lesions in the right lobe of the liver. Normal appearance of the gallbladder. Pancreas: Unremarkable. No pancreatic ductal dilatation or surrounding inflammatory changes. Spleen: Normal in size without focal abnormality. Adrenals/Urinary Tract: Normal adrenal glands.  Normal left kidney, ureters and urinary bladder. 8 mm nonobstructive calculus in the midpole region of the right kidney. Stomach/Bowel: Gastric band in stable position. No evidence of small-bowel obstruction. There is marked asymmetric circumferential thickening of the ascending colon with adjacent gas and fluid collection measuring 5.4 by 4.5 by 5.3 cm. Surrounding mesenteric stranding. Vascular/Lymphatic: No significant vascular findings are present. No enlarged abdominal or pelvic lymph nodes. Reproductive: Fibroid uterus. Normal adnexa. Other: No abdominal wall hernia or abnormality. No abdominopelvic ascites. Musculoskeletal: No acute or significant osseous findings.  IMPRESSION: 1. Recurrent marked asymmetric circumferential thickening of the ascending colon with adjacent gas and fluid collection measuring 5.4 x 4.5 x 5.3 cm. 2. At least 2 too small to be actually characterized hypoattenuated lesions in the right lobe of the liver. 3. 8 mm nonobstructive right renal calculus. 4. Gastric band in stable position. 5. Fibroid uterus. Electronically Signed   By: Ted Mcalpineobrinka  Dimitrova M.D.   On: 08/25/2018 20:49   Ct Abdomen Pelvis W Contrast  Result Date: 08/06/2018 CLINICAL DATA:  Perforated appendicitis with appendiceal abscess and status post percutaneous catheter drainage abscess on 07/20/2018. Recent follow-up study demonstrated resolution the appendiceal abscess with some residual inflammation present as well as a tiny fluid collection just deep to the right abdominal wall and anterior to the gallbladder. EXAM: CT ABDOMEN AND PELVIS WITH CONTRAST TECHNIQUE: Multidetector CT imaging of the abdomen and pelvis was performed using the standard protocol following bolus administration of intravenous contrast. CONTRAST:  125mL ISOVUE-300 IOPAMIDOL (ISOVUE-300) INJECTION 61% COMPARISON:  07/25/2018 and additional prior scans. FINDINGS: Lower chest: No acute abnormality. Hepatobiliary: Stable cyst in posterior right  lobe of the liver. The gallbladder is contracted and unremarkable. No biliary ductal dilatation. Pancreas: Unremarkable. No pancreatic ductal dilatation or surrounding inflammatory changes. Spleen: Normal in size without focal abnormality. Adrenals/Urinary Tract: Adrenal glands are unremarkable. Kidneys are normal, without renal calculi, focal lesion, or hydronephrosis. Bladder is unremarkable. Stomach/Bowel: Bowel shows no evidence of obstruction, ileus or significant inflammation. No free air identified. Inflammatory thickening adjacent to the cecum and appendix in the right lower quadrant has improved significantly since the prior scan with little to no further acute appearing inflammation present. Stable appearance of gastric band. The appendix is very difficult to discretely identify but likely lies very close to the percutaneous drain. The appendix is not visibly grossly enlarged or thickened. There is no evidence of a visible calcified appendicolith. Vascular/Lymphatic: No significant vascular findings are present. No enlarged abdominal or pelvic lymph nodes. Reproductive: Uterus and bilateral adnexa are unremarkable. Other: The tiny fluid collection deep to the right abdominal wall and fairly close to the gallbladder has resolved since the prior scan. No new abscess identified. At the level of the percutaneous drain just medial to the cecum and adjacent to the appendix, no further fluid collection is identified. There is no evidence of extraluminal air or new abscess in this region. Musculoskeletal: No acute or significant osseous findings. IMPRESSION: 1. Resolved appendiceal abscess. Significant reduction in inflammation adjacent to the cecum and involving the appendix. The appendix is not visibly thickened and there is no evidence of an appendicolith. 2. Resolved small fluid collection deep to the right abdominal wall and anterior to the gallbladder on the prior scan 12 days ago. No new fluid collections  identified. Electronically Signed   By: Irish LackGlenn  Yamagata M.D.   On: 08/06/2018 14:08   Ct Image Guided Fluid Drain By Catheter  Result Date: 08/27/2018 INDICATION: 68 year old female with a history ruptured appendicitis and recurrent abscess after drain removal EXAM: CT-GUIDED DRAINAGE RIGHT LOWER QUADRANT MEDICATIONS: The patient is currently admitted to the hospital and receiving intravenous antibiotics. The antibiotics were administered within an appropriate time frame prior to the initiation of the procedure. ANESTHESIA/SEDATION: Fentanyl 100 mcg IV; Versed .  2.0 mg IV Moderate Sedation Time:  17 minutes The patient was continuously monitored during the procedure by the interventional radiology nurse under my direct supervision. COMPLICATIONS: None PROCEDURE: Informed written consent was obtained from the patient after a thorough discussion of  the procedural risks, benefits and alternatives. All questions were addressed. Maximal Sterile Barrier Technique was utilized including caps, mask, sterile gowns, sterile gloves, sterile drape, hand hygiene and skin antiseptic. A timeout was performed prior to the initiation of the procedure. Patient positioned supine position on CT gantry table. Scout CT was acquired for planning purposes. Using modified Seldinger technique, a 10 French drain was placed into periappendiceal abscess in the right lower quadrant. Aspiration of approximately 60 cc of purulent material was performed for culture. Drain was attached to bulb suction and sutured in position. Patient tolerated the procedure well and remained hemodynamically stable throughout. No complications were encountered and no significant blood loss. IMPRESSION: Status post CT-guided right lower quadrant drain. Signed, Yvone NeuJaime S. Reyne DumasWagner, DO, RPVI [facility] Electronically Signed   By: Gilmer MorJaime  Wagner D.O.   On: 08/27/2018 07:27   Ir Radiologist Eval & Mgmt  Result  Date: 08/13/2018 Please refer to notes tab for details about interventional procedure. (Op Note)  Ir Radiologist Eval & Mgmt  Result Date: 08/06/2018 Please refer to notes tab for details about interventional procedure. (Op Note)   Labs:  CBC: Recent Labs    07/23/18 0530 07/24/18 0340 08/25/18 1329 08/26/18 0200  WBC 10.5 9.0 15.5* 14.5*  HGB 11.8* 11.6* 13.1 12.6  HCT 35.9* 35.4* 41.9 39.9  PLT 344 317 203 180    COAGS: Recent Labs    07/19/18 0818 08/26/18 0200  INR 1.2 1.4*  APTT  --  31    BMP: Recent Labs    07/23/18 0530 07/24/18 0340 07/25/18 0336 08/25/18 1329 08/26/18 0200  NA 136 137  --  138 136  K 3.1* 3.2*  --  4.1 3.7  CL 100 101  --  103 102  CO2 25 27  --  26 24  GLUCOSE 109* 109*  --  119* 121*  BUN 7* 7*  --  12 12  CALCIUM 8.5* 8.5*  --  9.1 8.7*  CREATININE 0.80 0.78 0.74 0.89 0.79  GFRNONAA >60 >60 >60 >60 >60  GFRAA >60 >60 >60 >60 >60    LIVER FUNCTION TESTS: Recent Labs    07/21/18 0818 07/22/18 0156 07/23/18 0530 08/25/18 1329  BILITOT 1.1 0.7 0.4 1.6*  AST 12* 12* 15 18  ALT 19 15 15 24   ALKPHOS 54 56 55 88  PROT 6.2* 6.3* 6.7 7.1  ALBUMIN 2.1* 2.2* 2.2* 3.3*    TUMOR MARKERS: No results for input(s): AFPTM, CEA, CA199, CHROMGRNA in the last 8760 hours.  Assessment and Plan:  Recurrent marked asymmetric circumferential thickening of the ascending colon with adjacent gas and fluid collection measuring 5.4 x 4.5 x 5.3 cm.  Will proceed with image guided placement of a RLQ drain today by Dr. Loreta AveWagner.  Risks and benefits discussed with the patient including bleeding, infection, damage to adjacent structures, bowel perforation/fistula connection, and sepsis.  All of the patient's questions were answered, patient is agreeable to proceed. Consent signed and in chart.  Thank you for this interesting consult.  I greatly enjoyed meeting Julie Richards and look forward to participating in their care.  A copy of this  report was sent to the requesting provider on this date.  Electronically Signed: Gwynneth MacleodWENDY S Demetruis Depaul, PA-C   08/27/2018, 11:30 AM      I spent a total of    25 Minutes in face to face in clinical consultation, greater than 50% of which was counseling/coordinating care for RLQ drain.

## 2018-08-27 NOTE — Progress Notes (Signed)
Subjective/Chief Complaint: Pt feels better this AM    Objective: Vital signs in last 24 hours: Temp:  [98.5 F (36.9 C)-101.3 F (38.5 C)] 99.4 F (37.4 C) (08/06 0758) Pulse Rate:  [79-119] 119 (08/06 0758) Resp:  [14-22] 17 (08/06 0758) BP: (108-138)/(55-89) 135/89 (08/06 0758) SpO2:  [91 %-100 %] 93 % (08/06 0758) Last BM Date: 08/23/18  Intake/Output from previous day: 08/05 0701 - 08/06 0700 In: 1122.6 [P.O.:120; I.V.:809.2; IV Piggyback:148.4] Out: -  Intake/Output this shift: No intake/output data recorded.  PE: Constitutional: No acute distress, conversant, appears states age. Eyes: Anicteric sclerae, moist conjunctiva, no lid lag Lungs: Clear to auscultation bilaterally, normal respiratory effort CV: regular rate and rhythm, no murmurs, no peripheral edema, pedal pulses 2+ GI: Soft, no masses or hepatosplenomegaly, non-tender to palpation, drain SS Skin: No rashes, palpation reveals normal turgor Psychiatric: appropriate judgment and insight, oriented to person, place, and time   Lab Results:  Recent Labs    08/25/18 1329 08/26/18 0200  WBC 15.5* 14.5*  HGB 13.1 12.6  HCT 41.9 39.9  PLT 203 180   BMET Recent Labs    08/25/18 1329 08/26/18 0200  NA 138 136  K 4.1 3.7  CL 103 102  CO2 26 24  GLUCOSE 119* 121*  BUN 12 12  CREATININE 0.89 0.79  CALCIUM 9.1 8.7*   PT/INR Recent Labs    08/26/18 0200  LABPROT 16.8*  INR 1.4*   ABG No results for input(s): PHART, HCO3 in the last 72 hours.  Invalid input(s): PCO2, PO2  Studies/Results: Ct Abdomen Pelvis W Contrast  Result Date: 08/25/2018 CLINICAL DATA:  Right lower quadrant pain. Peritonitis. EXAM: CT ABDOMEN AND PELVIS WITH CONTRAST TECHNIQUE: Multidetector CT imaging of the abdomen and pelvis was performed using the standard protocol following bolus administration of intravenous contrast. CONTRAST:  100mL OMNIPAQUE IOHEXOL 300 MG/ML  SOLN COMPARISON:  August 06, 2018 FINDINGS: Lower  chest: No acute abnormality. Hepatobiliary: 1.7 cm cyst in the right lobe of the liver. At least 2 other too small to be actually characterized hypoattenuated lesions in the right lobe of the liver. Normal appearance of the gallbladder. Pancreas: Unremarkable. No pancreatic ductal dilatation or surrounding inflammatory changes. Spleen: Normal in size without focal abnormality. Adrenals/Urinary Tract: Normal adrenal glands. Normal left kidney, ureters and urinary bladder. 8 mm nonobstructive calculus in the midpole region of the right kidney. Stomach/Bowel: Gastric band in stable position. No evidence of small-bowel obstruction. There is marked asymmetric circumferential thickening of the ascending colon with adjacent gas and fluid collection measuring 5.4 by 4.5 by 5.3 cm. Surrounding mesenteric stranding. Vascular/Lymphatic: No significant vascular findings are present. No enlarged abdominal or pelvic lymph nodes. Reproductive: Fibroid uterus. Normal adnexa. Other: No abdominal wall hernia or abnormality. No abdominopelvic ascites. Musculoskeletal: No acute or significant osseous findings. IMPRESSION: 1. Recurrent marked asymmetric circumferential thickening of the ascending colon with adjacent gas and fluid collection measuring 5.4 x 4.5 x 5.3 cm. 2. At least 2 too small to be actually characterized hypoattenuated lesions in the right lobe of the liver. 3. 8 mm nonobstructive right renal calculus. 4. Gastric band in stable position. 5. Fibroid uterus. Electronically Signed   By: Ted Mcalpineobrinka  Dimitrova M.D.   On: 08/25/2018 20:49   Ct Image Guided Fluid Drain By Catheter  Result Date: 08/27/2018 INDICATION: 68 year old female with a history ruptured appendicitis and recurrent abscess after drain removal EXAM: CT-GUIDED DRAINAGE RIGHT LOWER QUADRANT MEDICATIONS: The patient is currently admitted to the hospital and  receiving intravenous antibiotics. The antibiotics were administered within an appropriate time frame  prior to the initiation of the procedure. ANESTHESIA/SEDATION: Fentanyl 100 mcg IV; Versed .  2.0 mg IV Moderate Sedation Time:  17 minutes The patient was continuously monitored during the procedure by the interventional radiology nurse under my direct supervision. COMPLICATIONS: None PROCEDURE: Informed written consent was obtained from the patient after a thorough discussion of the procedural risks, benefits and alternatives. All questions were addressed. Maximal Sterile Barrier Technique was utilized including caps, mask, sterile gowns, sterile gloves, sterile drape, hand hygiene and skin antiseptic. A timeout was performed prior to the initiation of the procedure. Patient positioned supine position on CT gantry table. Scout CT was acquired for planning purposes. Using modified Seldinger technique, a 10 French drain was placed into periappendiceal abscess in the right lower quadrant. Aspiration of approximately 60 cc of purulent material was performed for culture. Drain was attached to bulb suction and sutured in position. Patient tolerated the procedure well and remained hemodynamically stable throughout. No complications were encountered and no significant blood loss. IMPRESSION: Status post CT-guided right lower quadrant drain. Signed, Dulcy Fanny. Dellia Nims, RPVI [facility] Interventional Radiology Specialists [facility] Electronically Signed   By: Corrie Mckusick D.O.   On: 08/27/2018 07:27    Anti-infectives: Anti-infectives (From admission, onward)   Start     Dose/Rate Route Frequency Ordered Stop   08/26/18 0600  piperacillin-tazobactam (ZOSYN) IVPB 3.375 g     3.375 g 12.5 mL/hr over 240 Minutes Intravenous Every 8 hours 08/25/18 2141     08/25/18 2115  ceFEPIme (MAXIPIME) 2 g in sodium chloride 0.9 % 100 mL IVPB     2 g 200 mL/hr over 30 Minutes Intravenous  Once 08/25/18 2102 08/25/18 2229   08/25/18 2115  metroNIDAZOLE (FLAGYL) IVPB 500 mg     500 mg 100 mL/hr over 60 Minutes  Intravenous  Once 08/25/18 2102 08/25/18 2257      Assessment/Plan: Hx of gastric banding  Recurrent5cmright lower quadrant intra-abdominal abscess secondary to perforated appendicitis  - Admitted 6/18 for perforated appendicitis. Tx with abx  - Readmitted 6/27 with right lower quadrant intra-abdominal abscess - IR drain placement 6/29. IR drain removed 7/23.  - CT 8/4 recurrent right lower quadrant abscess measuring 5.4 x 4.5 x 5.3 cm  - Continue IV abx -- We discussed with ongoing inflammation for the last 6 weeks, an attempt at surgical treatment would most likely end up being ileocecectomy, high risk of conversion to open surgery, risk of ileus, risk of leak, risk of ongoing abscess. We discussed that if the drain cannot be placed and symptoms do not resolve with antibiotics, surgery would be the last resort of treatment.  FEN - ADAT VTE - SCDs ID - Zosyn.   Dispo: Plan to adv diet If tol PO and ambulating later today Ok for home vs. Fri with abx and f/u in clinic and IR drain clinic  LOS: 1 day    Ralene Ok 08/27/2018

## 2018-08-28 LAB — CBC
HCT: 34.9 % — ABNORMAL LOW (ref 36.0–46.0)
Hemoglobin: 11.1 g/dL — ABNORMAL LOW (ref 12.0–15.0)
MCH: 26.9 pg (ref 26.0–34.0)
MCHC: 31.8 g/dL (ref 30.0–36.0)
MCV: 84.7 fL (ref 80.0–100.0)
Platelets: 200 10*3/uL (ref 150–400)
RBC: 4.12 MIL/uL (ref 3.87–5.11)
RDW: 15.1 % (ref 11.5–15.5)
WBC: 14.6 10*3/uL — ABNORMAL HIGH (ref 4.0–10.5)
nRBC: 0 % (ref 0.0–0.2)

## 2018-08-28 MED ORDER — HYDROMORPHONE HCL 1 MG/ML IJ SOLN
0.5000 mg | INTRAMUSCULAR | Status: DC | PRN
  Administered 2018-08-28 – 2018-09-14 (×4): 0.5 mg via INTRAVENOUS
  Filled 2018-08-28 (×4): qty 1

## 2018-08-28 MED ORDER — POLYETHYLENE GLYCOL 3350 17 G PO PACK
17.0000 g | PACK | Freq: Once | ORAL | Status: AC
  Administered 2018-08-28: 10:00:00 17 g via ORAL
  Filled 2018-08-28: qty 1

## 2018-08-28 MED ORDER — BISACODYL 10 MG RE SUPP
10.0000 mg | Freq: Once | RECTAL | Status: AC
  Administered 2018-08-28: 19:00:00 10 mg via RECTAL
  Filled 2018-08-28: qty 1

## 2018-08-28 MED ORDER — METHOCARBAMOL 500 MG PO TABS
750.0000 mg | ORAL_TABLET | Freq: Three times a day (TID) | ORAL | Status: DC | PRN
  Administered 2018-08-29 – 2018-08-31 (×4): 750 mg via ORAL
  Filled 2018-08-28 (×5): qty 2

## 2018-08-28 MED ORDER — METHOCARBAMOL 500 MG PO TABS
750.0000 mg | ORAL_TABLET | ORAL | Status: AC
  Administered 2018-08-28: 19:00:00 750 mg via ORAL
  Filled 2018-08-28: qty 2

## 2018-08-28 MED ORDER — TRAMADOL HCL 50 MG PO TABS
50.0000 mg | ORAL_TABLET | Freq: Four times a day (QID) | ORAL | Status: DC | PRN
  Administered 2018-08-28 – 2018-09-15 (×7): 50 mg via ORAL
  Filled 2018-08-28 (×8): qty 1

## 2018-08-28 MED ORDER — POLYETHYLENE GLYCOL 3350 17 G PO PACK
17.0000 g | PACK | Freq: Every day | ORAL | Status: DC
  Administered 2018-08-28 – 2018-09-02 (×5): 17 g via ORAL
  Filled 2018-08-28 (×6): qty 1

## 2018-08-28 MED ORDER — AMOXICILLIN-POT CLAVULANATE 875-125 MG PO TABS: 1.0000 | ORAL_TABLET | Freq: Two times a day (BID) | ORAL | 0 refills | Status: DC

## 2018-08-28 MED ORDER — ENOXAPARIN SODIUM 40 MG/0.4ML ~~LOC~~ SOLN
40.0000 mg | Freq: Every day | SUBCUTANEOUS | Status: DC
  Administered 2018-08-28 – 2018-09-17 (×19): 40 mg via SUBCUTANEOUS
  Filled 2018-08-28 (×19): qty 0.4

## 2018-08-28 MED ORDER — TRAMADOL HCL 50 MG PO TABS: 50.0000 mg | ORAL_TABLET | Freq: Four times a day (QID) | ORAL | 0 refills | Status: DC | PRN

## 2018-08-28 NOTE — Progress Notes (Signed)
Referring Physician(s): * No referring provider recorded for this case *  Supervising Physician: Julie Richards, Julie Richards  [facility]  Chief Complaint: Intra-abdominal fluid collection  Subjective: "I'm having a lot of gas pain and cramping."  Patient states she was given stool softeners today but with difficulty passing stool.  She reports she has been OOB to walk in the hallway.  Improved pain at drain site.   Allergies: Percocet [oxycodone-acetaminophen]  Medications: Prior to Admission medications   Medication Sig Start Date End Date Taking? Authorizing Provider  Cholecalciferol (VITAMIN D3 PO) Take 1 capsule by mouth daily.    Yes [provider]  Melatonin 5 MG TABS Take 5 mg by mouth every evening. 07/27/18  Yes [provider]  Multiple Vitamin (MULTIVITAMIN WITH MINERALS) TABS tablet Take 1 tablet by mouth daily.   Yes [provider]  OVER THE COUNTER MEDICATION Take 1 capsule by mouth daily. Mega Red   Yes [provider]  Potassium 99 MG TABS Take 1 tablet by mouth daily.   Yes [provider]  acetaminophen (TYLENOL) 325 MG tablet Take 2 tablets (650 mg total) by mouth every 6 (six) hours as needed for mild pain. Patient not taking: Reported on 08/25/2018 07/16/18   Rayburn, Tresa EndoKelly A, PA-C  amoxicillin-clavulanate (AUGMENTIN) 875-125 MG tablet Take 1 tablet by mouth 2 (two) times daily for 14 days. 08/28/18 09/11/18  Barnetta Chapelsborne, Kelly, PA-C  ibuprofen (ADVIL) 400 MG tablet Take 1 tablet (400 mg total) by mouth every 6 (six) hours as needed for moderate pain. Patient not taking: Reported on 08/25/2018 07/16/18   Rayburn, Tresa EndoKelly A, PA-C  ondansetron (ZOFRAN-ODT) 4 MG disintegrating tablet Take 1 tablet (4 mg total) by mouth every 6 (six) hours as needed for nausea. Patient not taking: Reported on 08/25/2018 07/27/18   Jerre SimonFocht, Jessica L, PA  traMADol (ULTRAM) 50 MG tablet Take 1-2 tablets (50-100 mg total) by mouth every 6 (six) hours as  needed for moderate pain or severe pain. Patient not taking: Reported on 08/25/2018 07/16/18   Rayburn, Tresa EndoKelly A, PA-C  traMADol (ULTRAM) 50 MG tablet Take 1 tablet (50 mg total) by mouth every 6 (six) hours as needed for moderate pain or severe pain. 08/28/18   Barnetta Chapelsborne, Kelly, PA-C     Vital Signs: BP 129/89 (BP Location: Right Arm)   Pulse 91   Temp 98.4 F (36.9 C) (Oral)   Resp 16   Ht 5\' 9"  (1.753 m)   Wt 297 lb 9.9 oz (135 kg)   SpO2 94%   BMI 43.95 kg/m   Physical Exam  NAD but uncomfortable Abdomen: distended but soft. Non-tender. RLQ drain in place.  Dark serous output in bulb.   Imaging: Ct Abdomen Pelvis W Contrast  Result Date: 08/25/2018 CLINICAL DATA:  Right lower quadrant pain. Peritonitis. EXAM: CT ABDOMEN AND PELVIS WITH CONTRAST TECHNIQUE: Multidetector CT imaging of the abdomen and pelvis was performed using the standard protocol following bolus administration of intravenous contrast. CONTRAST:  100mL OMNIPAQUE IOHEXOL 300 MG/ML  SOLN COMPARISON:  August 06, 2018 FINDINGS: Lower chest: No acute abnormality. Hepatobiliary: 1.7 cm cyst in the right lobe of the liver. At least 2 other too small to be actually characterized hypoattenuated lesions in the right lobe of the liver. Normal appearance of the gallbladder. Pancreas: Unremarkable. No pancreatic ductal dilatation or surrounding inflammatory changes. Spleen: Normal in size without focal abnormality. Adrenals/Urinary Tract: Normal adrenal glands. Normal left kidney, ureters and urinary bladder.  8 mm nonobstructive calculus in the midpole region of the right kidney. Stomach/Bowel: Gastric band in stable position. No evidence of small-bowel obstruction. There is marked asymmetric circumferential thickening of the ascending colon with adjacent gas and fluid collection measuring 5.4 by 4.5 by 5.3 cm. Surrounding mesenteric stranding. Vascular/Lymphatic: No significant vascular findings are present. No enlarged abdominal or pelvic  lymph nodes. Reproductive: Fibroid uterus. Normal adnexa. Other: No abdominal wall hernia or abnormality. No abdominopelvic ascites. Musculoskeletal: No acute or significant osseous findings. IMPRESSION: 1. Recurrent marked asymmetric circumferential thickening of the ascending colon with adjacent gas and fluid collection measuring 5.4 x 4.5 x 5.3 cm. 2. At least 2 too small to be actually characterized hypoattenuated lesions in the right lobe of the liver. 3. 8 mm nonobstructive right renal calculus. 4. Gastric band in stable position. 5. Fibroid uterus. Electronically Signed   By: Ted Mcalpineobrinka  Dimitrova M.D.   On: 08/25/2018 20:49   Ct Image Guided Fluid Drain By Catheter  Result Date: 08/27/2018 INDICATION: 68 year old female with a history ruptured appendicitis and recurrent abscess after drain removal EXAM: CT-GUIDED DRAINAGE RIGHT LOWER QUADRANT MEDICATIONS: The patient is currently admitted to the hospital and receiving intravenous antibiotics. The antibiotics were administered within an appropriate time frame prior to the initiation of the procedure. ANESTHESIA/SEDATION: Fentanyl 100 mcg IV; Versed .  2.0 mg IV Moderate Sedation Time:  17 minutes The patient was continuously monitored during the procedure by the interventional radiology nurse under my direct supervision. COMPLICATIONS: None PROCEDURE: Informed written consent was obtained from the patient after a thorough discussion of the procedural risks, benefits and alternatives. All questions were addressed. Maximal Sterile Barrier Technique was utilized including caps, mask, sterile gowns, sterile gloves, sterile drape, hand hygiene and skin antiseptic. A timeout was performed prior to the initiation of the procedure. Patient positioned supine position on CT gantry table. Scout CT was acquired for planning purposes. Using modified Seldinger technique, a 10 French drain was placed into periappendiceal abscess in the right lower quadrant. Aspiration of  approximately 60 cc of purulent material was performed for culture. Drain was attached to bulb suction and sutured in position. Patient tolerated the procedure well and remained hemodynamically stable throughout. No complications were encountered and no significant blood loss. IMPRESSION: Status post CT-guided right lower quadrant drain. Signed, Yvone NeuJaime S. Reyne DumasWagner, DO, RPVI [facility] Inter[facility] Radiology Special[facility] Electronically Signed   By: Gilmer MorJaime  Wagner D.O.   On: 08/27/2018 07:27    Labs:  CBC: Recent Labs    07/24/18 0340 08/25/18 1329 08/26/18 0200 08/28/18 1033  WBC 9.0 15.5* 14.5* 14.6*  HGB 11.6* 13.1 12.6 11.1*  HCT 35.4* 41.9 39.9 34.9*  PLT 317 203 180 200    COAGS: Recent Labs    07/19/18 0818 08/26/18 0200  INR 1.2 1.4*  APTT  --  31    BMP: Recent Labs    07/23/18 0530 07/24/18 0340 07/25/18 0336 08/25/18 1329 08/26/18 0200  NA 136 137  --  138 136  K 3.1* 3.2*  --  4.1 3.7  CL 100 101  --  103 102  CO2 25 27  --  26 24  GLUCOSE 109* 109*  --  119* 121*  BUN 7* 7*  --  12 12  CALCIUM 8.5* 8.5*  --  9.1 8.7*  CREATININE 0.80 0.78 0.74 0.89 0.79  GFRNONAA >60 >60 >60 >60 >60  GFRAA >60 >60 >60 >60 >60    LIVER FUNCTION TESTS: Recent Labs  07/21/18 0818 07/22/18 0156 07/23/18 0530 08/25/18 1329  BILITOT 1.1 0.7 0.4 1.6*  AST 12* 12* 15 18  ALT 19 15 15 24   ALKPHOS 54 56 55 88  PROT 6.2* 6.3* 6.7 7.1  ALBUMIN 2.1* 2.2* 2.2* 3.3*    Assessment and Plan: Intra-abdominal fluid collection, perforated appendix IR drain originally placed 6/29, removed 7/23.  Patient readmitted with recurrent RLQ abscess.  Drain replaced 8/5 and remains intact today.  Reports increased abdominal cramping, constipation. Planning for suppository. WBC 14.6 Culture grew E coli. On Zosyn.  Continue current management including TID flushes while inpatient.    Electronically Signed: Docia Barrier, PA 08/28/2018, 4:07 PM   I  spent a total of 15 Minutes at the the patient's bedside AND on the patient's hospital floor or unit, greater than 50% of which was counseling/coordinating care for intra-abdominal fluid collection.

## 2018-08-28 NOTE — Progress Notes (Signed)
[facility] Progress Note     Subjective: CC-  Some nausea over night, no emesis. Tolerating clear liquids. She does report some persistent pain around the drain. She also feels a little bloated. No BM since Sunday. Passing some flatus. She has been ambulating around her room. Nervous about going home because she has had to be readmitted in the past. Felt like she was discharged too quickly.  Objective: Vital signs in last 24 hours: Temp:  [98.3 F (36.8 C)-99.8 F (37.7 C)] 99.2 F (37.3 C) (08/07 0821) Pulse Rate:  [92-104] 92 (08/07 0821) Resp:  [14-17] 17 (08/07 0821) BP: (123-136)/(79-96) 123/81 (08/07 0821) SpO2:  [90 %-100 %] 96 % (08/07 0821) Last BM Date: 08/23/18  Intake/Output from previous day: 08/06 0701 - 08/07 0700 In: 485 [P.O.:480] Out: 20 [Drains:20] Intake/Output this shift: No intake/output data recorded.  PE: Gen:  Alert, NAD, pleasant HEENT: EOM's intact, pupils equal and round Pulm:  Rate and effort normal Abd: Soft, mild distension, mild tenderness around drain, drain with cloudy/SS drainage Skin: no rashes noted, warm and dry  Lab Results:  Recent Labs    08/25/18 1329 08/26/18 0200  WBC 15.5* 14.5*  HGB 13.1 12.6  HCT 41.9 39.9  PLT 203 180   BMET Recent Labs    08/25/18 1329 08/26/18 0200  NA 138 136  K 4.1 3.7  CL 103 102  CO2 26 24  GLUCOSE 119* 121*  BUN 12 12  CREATININE 0.89 0.79  CALCIUM 9.1 8.7*   PT/INR Recent Labs    08/26/18 0200  LABPROT 16.8*  INR 1.4*   CMP     Component Value Date/Time   NA 136 08/26/2018 0200   K 3.7 08/26/2018 0200   CL 102 08/26/2018 0200   CO2 24 08/26/2018 0200   GLUCOSE 121 (H) 08/26/2018 0200   BUN 12 08/26/2018 0200   CREATININE 0.79 08/26/2018 0200   CALCIUM 8.7 (L) 08/26/2018 0200   PROT 7.1 08/25/2018 1329   ALBUMIN 3.3 (L) 08/25/2018 1329   AST 18 08/25/2018 1329   ALT 24 08/25/2018 1329   ALKPHOS 88 08/25/2018 1329   BILITOT 1.6 (H) 08/25/2018 1329   GFRNONAA >60 08/26/2018 0200   GFRAA >60 08/26/2018 0200   Lipase     Component Value Date/Time   LIPASE 24 08/25/2018 1329       Studies/Results: Ct Image Guided Fluid Drain By Catheter  Result Date: 08/27/2018 INDICATION: 68 year old female with a history ruptured appendicitis and recurrent abscess after drain removal EXAM: CT-GUIDED DRAINAGE RIGHT LOWER QUADRANT MEDICATIONS: The patient is currently admitted to the hospital and receiving intravenous antibiotics. The antibiotics were administered within an appropriate time frame prior to the initiation of the procedure. ANESTHESIA/SEDATION: Fentanyl 100 mcg IV; Versed .  2.0 mg IV Moderate Sedation Time:  17 minutes The patient was continuously monitored during the procedure by the interventional radiology nurse under my direct supervision. COMPLICATIONS: None PROCEDURE: Informed written consent was obtained from the patient after a thorough discussion of the procedural risks, benefits and alternatives. All questions were addressed. Maximal Sterile Barrier Technique was utilized including caps, mask, sterile gowns, sterile gloves, sterile drape, hand hygiene and skin antiseptic. A timeout was performed prior to the initiation of the procedure. Patient positioned supine position on CT gantry table. Scout CT was acquired for planning purposes. Using modified Seldinger technique, a 10 French drain was placed into periappendiceal abscess in the right lower quadrant. Aspiration of approximately 60 cc of purulent  material was performed for culture. Drain was attached to bulb suction and sutured in position. Patient tolerated the procedure well and remained hemodynamically stable throughout. No complications were encountered and no significant blood loss. IMPRESSION: Status post CT-guided right lower quadrant drain. Signed, Yvone NeuJaime S. Reyne DumasWagner, DO, RPVI [facility]ascular and Interventional Radiology Specialists [facility] Electronically Signed   By: Gilmer MorJaime   Wagner D.O.   On: 08/27/2018 07:27    Anti-infectives: Anti-infectives (From admission, onward)   Start     Dose/Rate Route Frequency Ordered Stop   08/26/18 0600  piperacillin-tazobactam (ZOSYN) IVPB 3.375 g     3.375 g 12.5 mL/hr over 240 Minutes Intravenous Every 8 hours 08/25/18 2141     08/25/18 2115  ceFEPIme (MAXIPIME) 2 g in sodium chloride 0.9 % 100 mL IVPB     2 g 200 mL/hr over 30 Minutes Intravenous  Once 08/25/18 2102 08/25/18 2229   08/25/18 2115  metroNIDAZOLE (FLAGYL) IVPB 500 mg     500 mg 100 mL/hr over 60 Minutes Intravenous  Once 08/25/18 2102 08/25/18 2257       Assessment/Plan Hx of gastric banding  Recurrent5cmright lower quadrant intra-abdominal abscesssecondary to perforated appendicitis  - Admitted 6/18 for perforated appendicitis. Tx with abx  - Readmitted 6/27 with right lower quadrant intra-abdominal abscess - IR drain placement 6/29. IR drain removed 7/23.  - CT 8/4 recurrent right lower quadrant abscess measuring5.4 x 4.5 x 5.3 cm - Continue IV abx - Wediscussed with ongoing inflammation for the last 6 weeks, an attempt at surgical treatment would most likely end up being ileocecectomy, high risk of conversion to open surgery, risk of ileus, risk of leak, risk of ongoing abscess. We discussed that if the drain cannot be placed and symptoms do not resolve with antibiotics,surgerywould be the last resort of treatment.  FEN - decrease IVF, soft diet VTE -SCDs, lovenox ID -Zosyn 8/5>> Foley - none Follow up - Dr. Luisa Hartornett, IR  Plan: CBC pending. Advance to soft diet. Continue drain and IV antibiotics. Add oral pain medications. Mobilize. Miralax for constipation. Will recheck later today for possible discharge. She will go home with 2 weeks of oral antibiotics and follow up with IR and Dr. Luisa Hartornett. Daughter at home can help with flushing drain.   LOS: 2 days    Franne FortsBrooke A Rudean Icenhour , [facility] 08/28/2018, 9:22 AM Pager:  [phone] Mon-Thurs 7:00 am-4:30 pm Fri 7:00 am -11:30 AM Sat-Sun 7:00 am-11:30 am

## 2018-08-28 NOTE — Care Management Important Message (Signed)
Important Message  Patient Details  Name: Julie Richards MRN: [id number] Date of Birth: April 07, 1950   Medicare Important Message Given:  Yes     Linkon Siverson 08/28/2018, 4:00 PM

## 2018-08-29 NOTE — Plan of Care (Signed)
°  Problem: Elimination: °Goal: Will not experience complications related to bowel motility °Outcome: Progressing °  °Problem: Pain Managment: °Goal: General experience of comfort will improve °Outcome: Progressing °  °

## 2018-08-29 NOTE — Plan of Care (Signed)
  Problem: Nutrition: Goal: Adequate nutrition will be maintained Outcome: Progressing   Problem: Coping: Goal: Level of anxiety will decrease Outcome: Progressing   Problem: Pain Managment: Goal: General experience of comfort will improve Outcome: Progressing   

## 2018-08-29 NOTE — Discharge Summary (Signed)
    Patient ID: Julie Richards [id number] 1950-08-31 68 y.o.  Admit date: 08/25/2018 Discharge date: 08/29/2018  Admitting Diagnosis: Recurrent RLQ intra-abdominal abscess  Discharge Diagnosis Patient Active Problem List   Diagnosis Date Noted  . Postprocedural intraabdominal abscess 08/25/2018  . Abscess of abdominal cavity ([place]) 07/18/2018  . Perforated appendicitis 07/09/2018    Consultants IR  Reason for Admission: This is a very pleasant 68 year old woman who was just discharged from our service 1 month ago with a percutaneous drain in place for treatment of perforated appendicitis.  She had been doing well with minimal drain output and the drain was removed and IR clinic on July 23.  She followed up with Dr. Brantley Stage the following week and was doing well, however couple days ago began to have what she describes as a stitch in the right side and then persistent right lower quadrant pain beginning yesterday.  This was associated with chills.  Denies any change in appetite, nausea, emesis or diarrhea.  Procedures Percutaneous drain placement by IR, 8/5  Hospital Course:  The patient was admitted and started on IV abx therapy.  She was evaluated by IR who felt a new drain could be placed.  This was completed.  Her diet was able to be advanced as tolerated and at discharge she was converted to oral abx therapy.  She was stable on HD 4 for DC home.    Physical Exam: Heart: regular Lungs: CTAB Abd: soft, appropriately tender around her drain, drain with serosang output.  Allergies as of 08/29/2018      Reactions   Percocet [oxycodone-acetaminophen] Nausea And Vomiting      Medication List    TAKE these medications   acetaminophen 325 MG tablet Commonly known as: TYLENOL Take 2 tablets (650 mg total) by mouth every 6 (six) hours as needed for mild pain.   amoxicillin-clavulanate 875-125 MG tablet Commonly known as: Augmentin Take 1 tablet by mouth 2 (two) times daily for 14  days.   ibuprofen 400 MG tablet Commonly known as: ADVIL Take 1 tablet (400 mg total) by mouth every 6 (six) hours as needed for moderate pain.   Melatonin 5 MG Tabs Take 5 mg by mouth every evening.   multivitamin with minerals Tabs tablet Take 1 tablet by mouth daily.   ondansetron 4 MG disintegrating tablet Commonly known as: ZOFRAN-ODT Take 1 tablet (4 mg total) by mouth every 6 (six) hours as needed for nausea.   OVER THE COUNTER MEDICATION Take 1 capsule by mouth daily. Mega Red   Potassium 99 MG Tabs Take 1 tablet by mouth daily.   traMADol 50 MG tablet Commonly known as: ULTRAM Take 1 tablet (50 mg total) by mouth every 6 (six) hours as needed for moderate pain or severe pain. What changed: how much to take   VITAMIN D3 PO Take 1 capsule by mouth daily.        Follow-up Information    Corrie Mckusick, DO Follow up.   Specialties: Interventional Radiology, Radiology Contact information: [address] [place] [address] [phone]        Erroll Luna, MD. Go on 09/14/2018.   Specialty: General Surgery Why: Your appointment is 8/24 at 11:50am Please arrive 20 minutes prior to your appointment to check in. Contact information: [address] [place] [address] [phone]           Signed: Saverio Danker, [facility] 08/29/2018, 10:30 AM Pager: [phone]

## 2018-08-30 ENCOUNTER — Encounter (HOSPITAL_COMMUNITY): Payer: Self-pay | Admitting: *Deleted

## 2018-08-30 LAB — CBC WITH DIFFERENTIAL/PLATELET
Abs Immature Granulocytes: 0.04 10*3/uL (ref 0.00–0.07)
Basophils Absolute: 0 10*3/uL (ref 0.0–0.1)
Basophils Relative: 0 %
Eosinophils Absolute: 0.2 10*3/uL (ref 0.0–0.5)
Eosinophils Relative: 3 %
HCT: 32.8 % — ABNORMAL LOW (ref 36.0–46.0)
Hemoglobin: 10.6 g/dL — ABNORMAL LOW (ref 12.0–15.0)
Immature Granulocytes: 1 %
Lymphocytes Relative: 19 %
Lymphs Abs: 1.6 10*3/uL (ref 0.7–4.0)
MCH: 27.2 pg (ref 26.0–34.0)
MCHC: 32.3 g/dL (ref 30.0–36.0)
MCV: 84.1 fL (ref 80.0–100.0)
Monocytes Absolute: 0.7 10*3/uL (ref 0.1–1.0)
Monocytes Relative: 8 %
Neutro Abs: 5.8 10*3/uL (ref 1.7–7.7)
Neutrophils Relative %: 69 %
Platelets: 235 10*3/uL (ref 150–400)
RBC: 3.9 MIL/uL (ref 3.87–5.11)
RDW: 14.9 % (ref 11.5–15.5)
WBC: 8.4 10*3/uL (ref 4.0–10.5)
nRBC: 0 % (ref 0.0–0.2)

## 2018-08-30 NOTE — Progress Notes (Signed)
[facility] Progress Note     Subjective: CC-  No n/v, but patient wasn't discharged yesterday due to continued presence of intermitted sharp stabbing abdominal pains.  Reports that pain feels deep, not superficial at drain site.  Denies sensation of bowel cramps.    Objective: Vital signs in last 24 hours: Temp:  [98.2 F (36.8 C)-98.4 F (36.9 C)] 98.3 F (36.8 C) (08/09 0814) Pulse Rate:  [83-102] 102 (08/09 0814) Resp:  [16] 16 (08/08 2054) BP: (125-137)/(76-96) 137/96 (08/09 0814) SpO2:  [95 %-99 %] 95 % (08/09 0814) Last BM Date: 08/28/18  Intake/Output from previous day: 08/08 0701 - 08/09 0700 In: 1592.9 [P.O.:540; I.V.:937.9; IV Piggyback:100] Out: 10 [Drains:10] Intake/Output this shift: Total I/O In: 240 [P.O.:240] Out: 10 [Drains:10]  PE: Gen:  Alert, having severe spasm now, in tears, grabbing abdominal wall.   Pulm:  Rate and effort normal Abd: Soft, mild distension, mild tenderness around drain, drain with cloudy/SS drainage.  No significant change.   Skin: no rashes noted, warm and dry  Lab Results:  Recent Labs    08/28/18 1033 08/30/18 0506  WBC 14.6* 8.4  HGB 11.1* 10.6*  HCT 34.9* 32.8*  PLT 200 235   BMET No results for input(s): NA, K, CL, CO2, GLUCOSE, BUN, CREATININE, CALCIUM in the last 72 hours. PT/INR No results for input(s): LABPROT, INR in the last 72 hours. CMP     Component Value Date/Time   NA 136 08/26/2018 0200   K 3.7 08/26/2018 0200   CL 102 08/26/2018 0200   CO2 24 08/26/2018 0200   GLUCOSE 121 (H) 08/26/2018 0200   BUN 12 08/26/2018 0200   CREATININE 0.79 08/26/2018 0200   CALCIUM 8.7 (L) 08/26/2018 0200   PROT 7.1 08/25/2018 1329   ALBUMIN 3.3 (L) 08/25/2018 1329   AST 18 08/25/2018 1329   ALT 24 08/25/2018 1329   ALKPHOS 88 08/25/2018 1329   BILITOT 1.6 (H) 08/25/2018 1329   GFRNONAA >60 08/26/2018 0200   GFRAA >60 08/26/2018 0200   Lipase     Component Value Date/Time   LIPASE 24 08/25/2018  1329       Studies/Results: No results found.  Anti-infectives: Anti-infectives (From admission, onward)   Start     Dose/Rate Route Frequency Ordered Stop   08/28/18 0000  amoxicillin-clavulanate (AUGMENTIN) 875-125 MG tablet     1 tablet Oral 2 times daily 08/28/18 1530 09/11/18 2359   08/26/18 0600  piperacillin-tazobactam (ZOSYN) IVPB 3.375 g     3.375 g 12.5 mL/hr over 240 Minutes Intravenous Every 8 hours 08/25/18 2141     08/25/18 2115  ceFEPIme (MAXIPIME) 2 g in sodium chloride 0.9 % 100 mL IVPB     2 g 200 mL/hr over 30 Minutes Intravenous  Once 08/25/18 2102 08/25/18 2229   08/25/18 2115  metroNIDAZOLE (FLAGYL) IVPB 500 mg     500 mg 100 mL/hr over 60 Minutes Intravenous  Once 08/25/18 2102 08/25/18 2257       Assessment/Plan Hx of gastric banding  Recurrent5cmright lower quadrant intra-abdominal abscesssecondary to perforated appendicitis  - Admitted 6/18 for perforated appendicitis. Tx with abx  - Readmitted 6/27 with right lower quadrant intra-abdominal abscess - IR drain placement 6/29. IR drain removed 7/23.  - CT 8/4 recurrent right lower quadrant abscess measuring 5.4x 4.5x5.3 cm - Continue IV abx FEN - decrease IVF, soft diet VTE -SCDs, lovenox ID -Zosyn 8/5>> Foley - none Follow up - Dr. Brantley Stage, IR  Plan:  I agree with patient's concern that symptoms are still significant enough to warrant hospitalization.  Despite the fact that she is afebrile and WBCs down, she is still having episodes of severe pain.  These are brief enough that she isn't taking IV pain meds.   If no improvement in symptoms, would repeat CT tomorrow.  Then can discuss pros and cons of surgical tx.   Pt is understandably frustrated about the fact that it is almost 2 months since diagnosis and she still feels horrible.  I reviewed risk of ileocecectomy and risk of open operation.      LOS: 4 days    Maudry DiegoFaera L Jeannifer Drakeford, MD FACS Surgical Oncology, General Surgery, Trauma and  [facility], [place] [phone] Check [email or web address], password [email or web address] for coverage night/weekend

## 2018-08-30 NOTE — Progress Notes (Signed)
Continues to complain of intermittent right sided abdominal pain.  According to the patient, the pain periodically intensifies then goes away.  Continue to monitor.

## 2018-08-31 ENCOUNTER — Inpatient Hospital Stay (HOSPITAL_COMMUNITY): Payer: Medicare Other

## 2018-08-31 LAB — AEROBIC/ANAEROBIC CULTURE W GRAM STAIN (SURGICAL/DEEP WOUND)

## 2018-08-31 MED ORDER — IOHEXOL 300 MG/ML  SOLN
100.0000 mL | Freq: Once | INTRAMUSCULAR | Status: AC | PRN
  Administered 2018-08-31: 18:00:00 100 mL via INTRAVENOUS

## 2018-08-31 NOTE — Progress Notes (Signed)
Referring Physician(s): C. Kae Heller  Supervising Physician: Markus Daft  Patient Status:  [facility] - In-pt  Chief Complaint:  Recurrent abdominal abscess S/P RLQ drain by Dr. Earleen Newport 08/26/18  Subjective:  Ms Bunkley continues to have sharp stabbing pain in the RLQ. She states it is a 10/10 when it occurs. She said the pain medicine just makes her sleepy so she isn't sure it really  Helps.  She is frustrated and wants to get out of the hospital. She says she wants to do "something definitive" because she "tired of dealing with this".  Allergies: Percocet [oxycodone-acetaminophen]  Medications: Prior to Admission medications   Medication Sig Start Date End Date Taking? Authorizing Provider  Cholecalciferol (VITAMIN D3 PO) Take 1 capsule by mouth daily.    Yes [provider]  Melatonin 5 MG TABS Take 5 mg by mouth every evening. 07/27/18  Yes [provider]  Multiple Vitamin (MULTIVITAMIN WITH MINERALS) TABS tablet Take 1 tablet by mouth daily.   Yes [provider]  OVER THE COUNTER MEDICATION Take 1 capsule by mouth daily. Mega Red   Yes [provider]  Potassium 99 MG TABS Take 1 tablet by mouth daily.   Yes [provider]  acetaminophen (TYLENOL) 325 MG tablet Take 2 tablets (650 mg total) by mouth every 6 (six) hours as needed for mild pain. Patient not taking: Reported on 08/25/2018 07/16/18   Rayburn, Claiborne Billings A, PA-C  amoxicillin-clavulanate (AUGMENTIN) 875-125 MG tablet Take 1 tablet by mouth 2 (two) times daily for 14 days. 08/28/18 09/11/18  Saverio Danker, PA-C  ibuprofen (ADVIL) 400 MG tablet Take 1 tablet (400 mg total) by mouth every 6 (six) hours as needed for moderate pain. Patient not taking: Reported on 08/25/2018 07/16/18   Rayburn, Claiborne Billings A, PA-C  ondansetron (ZOFRAN-ODT) 4 MG disintegrating tablet Take 1 tablet (4 mg total) by mouth every 6 (six) hours as needed for nausea. Patient not taking: Reported on 08/25/2018 07/27/18   Kalman Drape, PA  traMADol (ULTRAM) 50 MG tablet Take 1-2 tablets (50-100 mg total) by mouth every 6 (six) hours as needed for moderate pain or severe pain. Patient not taking: Reported on 08/25/2018 07/16/18   Rayburn, Claiborne Billings A, PA-C  traMADol (ULTRAM) 50 MG tablet Take 1 tablet (50 mg total) by mouth every 6 (six) hours as needed for moderate pain or severe pain. 08/28/18   Saverio Danker, PA-C     Vital Signs: BP 134/89 (BP Location: Left Arm)   Pulse 84   Temp 98.6 F (37 C) (Oral)   Resp 18   Ht 5\' 9"  (1.753 m)   Wt 135 kg   SpO2 98%   BMI 43.95 kg/m   Physical Exam Awake and alert NAD RLQ drain in place. Not as tender at the site. About 10 mL tan thin drainage in bulb. 20 mL recorded output.  Imaging: No results found.  Labs:  CBC: Recent Labs    08/25/18 1329 08/26/18 0200 08/28/18 1033 08/30/18 0506  WBC 15.5* 14.5* 14.6* 8.4  HGB 13.1 12.6 11.1* 10.6*  HCT 41.9 39.9 34.9* 32.8*  PLT 203 180 200 235    COAGS: Recent Labs    07/19/18 0818 08/26/18 0200  INR 1.2 1.4*  APTT  --  31    BMP: Recent Labs    07/23/18 0530 07/24/18 0340 07/25/18 0336 08/25/18 1329 08/26/18 0200  NA 136 137  --  138 136  K 3.1* 3.2*  --  4.1  3.7  CL 100 101  --  103 102  CO2 25 27  --  26 24  GLUCOSE 109* 109*  --  119* 121*  BUN 7* 7*  --  12 12  CALCIUM 8.5* 8.5*  --  9.1 8.7*  CREATININE 0.80 0.78 0.74 0.89 0.79  GFRNONAA >60 >60 >60 >60 >60  GFRAA >60 >60 >60 >60 >60    LIVER FUNCTION TESTS: Recent Labs    07/21/18 0818 07/22/18 0156 07/23/18 0530 08/25/18 1329  BILITOT 1.1 0.7 0.4 1.6*  AST 12* 12* 15 18  ALT 19 15 15 24   ALKPHOS 54 56 55 88  PROT 6.2* 6.3* 6.7 7.1  ALBUMIN 2.1* 2.2* 2.2* 3.3*    Assessment and Plan:  Recurrent abdominal abscess S/P RLQ drain by Dr. Loreta AveWagner 08/26/18  Would repeat CT scan today.  General Surgery to discuss options for surgery.  Will continue to follow.  Electronically Signed: Gwynneth MacleodWENDY S Danen Lapaglia, PA-C 08/31/2018,  10:59 AM    I spent a total of 15 Minutes at the the patient's bedside AND on the patient's hospital floor or unit, greater than 50% of which was counseling/coordinating care for f/u drain.

## 2018-08-31 NOTE — Progress Notes (Signed)
Dressing peeling with serosanguinous drainage. Changed, noted catheter had been pulled out and line taped to apply tension. Catheter appeared slightly bent but had brisk blood return and flushed easily.  Please consult IV Team if line occlusions occur.

## 2018-08-31 NOTE — Progress Notes (Signed)
She noted light amt pink drainage near iv site- iv team consult placed to evaluate- charge nurse reported that they verified site was ok- pt doesn't remember them entering room states that she did fall asleep briefly

## 2018-08-31 NOTE — Plan of Care (Signed)
  Problem: Education: Goal: Knowledge of General Education information will improve Description: Including pain rating scale, medication(s)/side effects and non-pharmacologic comfort measures Outcome: Progressing   Problem: Health Behavior/Discharge Planning: Goal: Ability to manage health-related needs will improve Outcome: Progressing   Problem: Clinical Measurements: Goal: Ability to maintain clinical measurements within normal limits will improve Outcome: Progressing Goal: Respiratory complications will improve Outcome: Progressing   Problem: Activity: Goal: Risk for activity intolerance will decrease Outcome: Progressing   Problem: Nutrition: Goal: Adequate nutrition will be maintained Outcome: Progressing   Problem: Coping: Goal: Level of anxiety will decrease Outcome: Progressing   Problem: Pain Managment: Goal: General experience of comfort will improve Outcome: Progressing   Problem: Safety: Goal: Ability to remain free from injury will improve Outcome: Progressing   Problem: Skin Integrity: Goal: Risk for impaired skin integrity will decrease Outcome: Progressing   

## 2018-08-31 NOTE — Progress Notes (Signed)
Subjective: CC: Abdominal pain, RLQ Patient reports that her pain in her RLQ has improved. When it first began, she would have sharp, "spasm" like pain in her RLQ that would occur every 5 minutes and rated this as a 10/10. Today this pain occurs every 15 minutes, only last a few seconds and is less severe at a 6/10. She notes nothing brings on the pain. She is no longer having to take IV pain medication for the pain. She is tolerating a soft diet without any N/V. She did have a BM after a suppository on Friday. Today she starting passing gas. She has been able to mobilize in the halls. She states she feels mentally drained.   Objective: Vital signs in last 24 hours: Temp:  [98.2 F (36.8 C)-98.7 F (37.1 C)] 98.6 F (37 C) (08/10 0846) Pulse Rate:  [81-88] 84 (08/10 0846) Resp:  [16-18] 18 (08/10 0846) BP: (111-134)/(85-91) 134/89 (08/10 0846) SpO2:  [96 %-100 %] 98 % (08/10 0846) Last BM Date: 08/28/18  Intake/Output from previous day: 08/09 0701 - 08/10 0700 In: 495 [P.O.:490] Out: 20 [Drains:20] Intake/Output this shift: No intake/output data recorded.  PE: Gen:  Alert, NAD, anxious Pulm:  Normal rate and effort Abd: Soft, ND, tenderness around the RLQ without r/r/g. There is some warmth of the skin around the RLQ when compared to the left abdomen but no erythema, induration, fluctuance. IR drain in place. JP with bloody, purulent like fluid. 20cc/24 hours. There is mild bruising around drain periwound.  +BS Ext:  No edema Psych: A&Ox3   Lab Results:  Recent Labs    08/28/18 1033 08/30/18 0506  WBC 14.6* 8.4  HGB 11.1* 10.6*  HCT 34.9* 32.8*  PLT 200 235   BMET No results for input(s): NA, K, CL, CO2, GLUCOSE, BUN, CREATININE, CALCIUM in the last 72 hours. PT/INR No results for input(s): LABPROT, INR in the last 72 hours. CMP     Component Value Date/Time   NA 136 08/26/2018 0200   K 3.7 08/26/2018 0200   CL 102 08/26/2018 0200   CO2 24 08/26/2018 0200    GLUCOSE 121 (H) 08/26/2018 0200   BUN 12 08/26/2018 0200   CREATININE 0.79 08/26/2018 0200   CALCIUM 8.7 (L) 08/26/2018 0200   PROT 7.1 08/25/2018 1329   ALBUMIN 3.3 (L) 08/25/2018 1329   AST 18 08/25/2018 1329   ALT 24 08/25/2018 1329   ALKPHOS 88 08/25/2018 1329   BILITOT 1.6 (H) 08/25/2018 1329   GFRNONAA >60 08/26/2018 0200   GFRAA >60 08/26/2018 0200   Lipase     Component Value Date/Time   LIPASE 24 08/25/2018 1329       Studies/Results: No results found.  Anti-infectives: Anti-infectives (From admission, onward)   Start     Dose/Rate Route Frequency Ordered Stop   08/28/18 0000  amoxicillin-clavulanate (AUGMENTIN) 875-125 MG tablet     1 tablet Oral 2 times daily 08/28/18 1530 09/11/18 2359   08/26/18 0600  piperacillin-tazobactam (ZOSYN) IVPB 3.375 g     3.375 g 12.5 mL/hr over 240 Minutes Intravenous Every 8 hours 08/25/18 2141     08/25/18 2115  ceFEPIme (MAXIPIME) 2 g in sodium chloride 0.9 % 100 mL IVPB     2 g 200 mL/hr over 30 Minutes Intravenous  Once 08/25/18 2102 08/25/18 2229   08/25/18 2115  metroNIDAZOLE (FLAGYL) IVPB 500 mg     500 mg 100 mL/hr over 60 Minutes Intravenous  Once 08/25/18  2102 08/25/18 2257       Assessment/Plan Hx of gastric banding  Recurrent5cmright lower quadrant intra-abdominal abscesssecondary to perforated appendicitis  - Admitted 6/18 for perforated appendicitis. Tx with abx  - Readmitted 6/27 with right lower quadrant intra-abdominal abscess - IR drain placement 6/29. IR drain removed 7/23.  - CT 8/4 recurrent right lower quadrant abscess measuring 5.4x 4.5 x 5.3 cm - Continue IV abx - Clinically she has improved today. Pain is less severe, less frequent, she is no longer requiring IV pain medication, she is afebrile, WBC has normalized, she is tolerating a diet and is having bowel function. We discussed treatment options and will repeat a CT scan today.   FEN - Soft diet VTE -SCDs, lovenox ID -Zosyn 8/5>>  Foley - none Follow up - Dr. Brantley Stage, IR    LOS: 5 days    Jillyn Ledger , [facility] 08/31/2018, 9:48 AM Pager: [phone]

## 2018-09-01 LAB — CBC
HCT: 35.2 % — ABNORMAL LOW (ref 36.0–46.0)
Hemoglobin: 11.2 g/dL — ABNORMAL LOW (ref 12.0–15.0)
MCH: 27 pg (ref 26.0–34.0)
MCHC: 31.8 g/dL (ref 30.0–36.0)
MCV: 84.8 fL (ref 80.0–100.0)
Platelets: 311 10*3/uL (ref 150–400)
RBC: 4.15 MIL/uL (ref 3.87–5.11)
RDW: 14.9 % (ref 11.5–15.5)
WBC: 10.3 10*3/uL (ref 4.0–10.5)
nRBC: 0 % (ref 0.0–0.2)

## 2018-09-01 LAB — BASIC METABOLIC PANEL
Anion gap: 12 (ref 5–15)
BUN: 5 mg/dL — ABNORMAL LOW (ref 8–23)
CO2: 25 mmol/L (ref 22–32)
Calcium: 9 mg/dL (ref 8.9–10.3)
Chloride: 104 mmol/L (ref 98–111)
Creatinine, Ser: 0.76 mg/dL (ref 0.44–1.00)
GFR calc Af Amer: >60 mL/min (ref >60)
GFR calc non Af Amer: >60 mL/min (ref >60)
Glucose, Bld: 101 mg/dL — ABNORMAL HIGH (ref 70–99)
Potassium: 3.3 mmol/L — ABNORMAL LOW (ref 3.5–5.1)
Sodium: 141 mmol/L (ref 135–145)

## 2018-09-01 LAB — PREALBUMIN: Prealbumin: 10.7 mg/dL — ABNORMAL LOW (ref 18–38)

## 2018-09-01 MED ORDER — BISACODYL 10 MG RE SUPP
10.0000 mg | Freq: Once | RECTAL | Status: AC
  Administered 2018-09-01: 09:00:00 10 mg via RECTAL
  Filled 2018-09-01: qty 1

## 2018-09-01 MED ORDER — ENSURE ENLIVE PO LIQD
237.0000 mL | Freq: Two times a day (BID) | ORAL | Status: DC
  Administered 2018-09-01 – 2018-09-02 (×2): 237 mL via ORAL

## 2018-09-01 MED ORDER — POTASSIUM CHLORIDE CRYS ER 20 MEQ PO TBCR
40.0000 meq | EXTENDED_RELEASE_TABLET | Freq: Two times a day (BID) | ORAL | Status: AC
  Administered 2018-09-01 (×2): 40 meq via ORAL
  Filled 2018-09-01 (×2): qty 2

## 2018-09-01 NOTE — Care Management Important Message (Signed)
Important Message  Patient Details  Name: Julie Richards MRN: [id number] Date of Birth: 1950/03/27   Medicare Important Message Given:  Yes     Memory Argue 09/01/2018, 3:33 PM

## 2018-09-01 NOTE — Progress Notes (Signed)
[facility] Progress Note     Subjective: CC-  Up in chair this morning. Feels a little better than yesterday, but does continue to have intermittent RLQ pain. At times the pain is sharp, although they are less frequent. Denies n/v. Last BM 8/7. Passing flatus. Was tolerating a soft diet yesterday without increased abdominal pain. CT scan from yesterday shows that the initial abscess that was drained has resolved, but there is persistent inflammatory changes in the RLQ and a new fluid collection 5.7 x 1.5 x 3.4 cm along the anterolateral peritoneal cavity abutting the anterior margin of the cecum. It also shows that her Lap band has slipped inferiorly on the stomach.  Objective: Vital signs in last 24 hours: Temp:  [97.9 F (36.6 C)-98.6 F (37 C)] 98.4 F (36.9 C) (08/11 0432) Pulse Rate:  [81-85] 81 (08/11 0432) Resp:  [16-18] 18 (08/11 0432) BP: (134-154)/(89-95) 138/90 (08/11 0432) SpO2:  [98 %-100 %] 99 % (08/11 0432) Last BM Date: 08/28/18  Intake/Output from previous day: 08/10 0701 - 08/11 0700 In: 725 [P.O.:720; I.V.:5] Out: 10 [Drains:10] Intake/Output this shift: No intake/output data recorded.  PE: Gen:  Alert, NAD, pleasant HEENT: EOM's intact, pupils equal and round Pulm:  Rate and effort normal Abd: Soft, ND, TTP RLQ and around drain, no peritonitis, +BS, IR drain in place with trace serous/slightly cloudy fluid in bulb (10cc output last 24 hours) Ext: no BLE edema Psych: A&Ox3  Skin: no rashes noted, warm and dry  Lab Results:  Recent Labs    08/30/18 0506  WBC 8.4  HGB 10.6*  HCT 32.8*  PLT 235   BMET No results for input(s): NA, K, CL, CO2, GLUCOSE, BUN, CREATININE, CALCIUM in the last 72 hours. PT/INR No results for input(s): LABPROT, INR in the last 72 hours. CMP     Component Value Date/Time   NA 136 08/26/2018 0200   K 3.7 08/26/2018 0200   CL 102 08/26/2018 0200   CO2 24 08/26/2018 0200   GLUCOSE 121 (H) 08/26/2018 0200   BUN 12 08/26/2018 0200   CREATININE 0.79 08/26/2018 0200   CALCIUM 8.7 (L) 08/26/2018 0200   PROT 7.1 08/25/2018 1329   ALBUMIN 3.3 (L) 08/25/2018 1329   AST 18 08/25/2018 1329   ALT 24 08/25/2018 1329   ALKPHOS 88 08/25/2018 1329   BILITOT 1.6 (H) 08/25/2018 1329   GFRNONAA >60 08/26/2018 0200   GFRAA >60 08/26/2018 0200   Lipase     Component Value Date/Time   LIPASE 24 08/25/2018 1329       Studies/Results: Ct Abdomen Pelvis W Contrast  Result Date: 08/31/2018 CLINICAL DATA:  Pt c/o continued presence of intermitted sharp stabbing abdominal pains. Reports that pain feels deep, not superficial at drain site. Denies sensation of bowel crampsHx of appy with infection and drains EXAM: CT ABDOMEN AND PELVIS WITH CONTRAST TECHNIQUE: Multidetector CT imaging of the abdomen and pelvis was performed using the standard protocol following bolus administration of intravenous contrast. CONTRAST:  124mL OMNIPAQUE IOHEXOL 300 MG/ML  SOLN COMPARISON:  08/25/2018 FINDINGS: Lower chest: Mild dependent atelectasis.  No acute findings. Hepatobiliary: Liver normal in size. 17 mm low-density lesion, posterior right lobe, consistent with a cyst and stable. There are small additional more subtle low-density lesions in the right lobe that are also stable consistent with cysts. No other liver masses or lesions. Gallbladder is unremarkable. No bile duct dilation. Pancreas: Unremarkable. No pancreatic ductal dilatation or surrounding inflammatory changes. Spleen: Normal in size  without focal abnormality. Adrenals/Urinary Tract: No adrenal masses. Kidneys are normal in size, orientation and position. There are 2 adjacent nonobstructing stones in the mid to upper pole of the right kidney. No other intrarenal stones. No masses. No hydronephrosis. Normal ureters. Bladder is unremarkable. Stomach/Bowel: Pigtail catheter has been placed through the right lower quadrant anterior abdominal wall. Pigtail curls along the  medial margin of the cecum. There is adjacent inflammation. The fluid collection noted in this location on the prior CT has been evacuated. There is a small fluid collection that is now evident along the adjacent anterolateral peritoneal cavity, abutting the anterolateral cecum. This measures 5.7 cm from superior to inferior by 3.4 x 1.5 cm transversely. Inflammatory wall thickening is noted along the cecum and inferior ascending colon. No other colonic wall thickening or adjacent inflammation. Small bowel is normal in caliber. No wall thickening or inflammation. The patient has a lap band, which lies approximately 6 cm below the gastroesophageal junction, and circling the upper stomach, unchanged compared to the prior CT. No stomach wall thickening or inflammation. Vascular/Lymphatic: No significant vascular findings are present. No enlarged abdominal or pelvic lymph nodes. Reproductive: Uterus normal in size. Small calcified fibroid along the anterior uterine fundus. No adnexal masses. Other: No ascites or hernia. Musculoskeletal: No fracture or acute finding. No osteoblastic or osteolytic lesions. IMPRESSION: 1. Right lower quadrant pigtail catheter has evacuated the collection noted on the prior CT, along the medial margin of the cecum. 2. There is a smaller new collection along the anterolateral peritoneal cavity abutting the anterior margin of the cecum, measuring 5.7 x 1.5 x 3.4 cm. Persistent inflammatory changes are noted in the right lower quadrant adjacent to the pigtail catheter, cecum and lower ascending colon. 3. Lap band has slipped inferiorly on the stomach as described above. Electronically Signed   By: Amie Portlandavid  Ormond M.D.   On: 08/31/2018 19:22    Anti-infectives: Anti-infectives (From admission, onward)   Start     Dose/Rate Route Frequency Ordered Stop   08/28/18 0000  amoxicillin-clavulanate (AUGMENTIN) 875-125 MG tablet     1 tablet Oral 2 times daily 08/28/18 1530 09/11/18 2359    08/26/18 0600  piperacillin-tazobactam (ZOSYN) IVPB 3.375 g     3.375 g 12.5 mL/hr over 240 Minutes Intravenous Every 8 hours 08/25/18 2141     08/25/18 2115  ceFEPIme (MAXIPIME) 2 g in sodium chloride 0.9 % 100 mL IVPB     2 g 200 mL/hr over 30 Minutes Intravenous  Once 08/25/18 2102 08/25/18 2229   08/25/18 2115  metroNIDAZOLE (FLAGYL) IVPB 500 mg     500 mg 100 mL/hr over 60 Minutes Intravenous  Once 08/25/18 2102 08/25/18 2257       Assessment/Plan Hx of gastric banding - [place] of [place]  Perforated appendicitis  [id number] intra-abdominal abscess- s/p drain 8/5 New RLQ abscess -  5.7 x 1.5 x 3.4 cm, seen on CT 8/10 - Admitted 6/18 for perforated appendicitis. Tx with abx  - Readmitted 6/27 with right lower quadrant intra-abdominal abscess - IR drain placement 6/29. IR drain removed 7/23.  - CT 8/4 recurrent right lower quadrant abscess measuring5.4x 4.5 x 5.3 cm - IR replaced drain 8/5, now with minimal output and CT scan shows this abscess has resolved - CT 8/10 shows that the initial abscess that was drained has resolved, but there is persistent inflammatory changes in the RLQ and a new fluid collection 5.7 x 1.5 x  3.4 cm along the anterolateral peritoneal cavity abutting the anterior margin of the cecum  FEN - IVF, NPO VTE -SCDs, lovenox ID -Zosyn 8/5>> Foley - none Follow up - Dr. Luisa Hartornett, IR  Plan: Clinically continues to feel a little better, but abdominal pain has not resolved despite prolonged course of antibiotics and IR drainage of abdominal abscess. CT scan shows that the initial abscess that was drained has resolved, but there is persistent inflammatory changes in the RLQ and now a new fluid collection 5.7 x 1.5 x 3.4 cm.  Repeat labs including prealbumin today. May require surgical intervention. Keep NPO for now.  Dulcolax suppository for constipation. Also of noted, her Lap band has slipped inferiorly on the stomach.  This was reviewed with Dr. Andrey CampanileWilson who advised follow up as this could put her at risk for ulcerations and perforation.   LOS: 6 days    Franne FortsBrooke A Simonne Boulos , [facility] 09/01/2018, 8:30 AM Pager: [phone] Mon-Thurs 7:00 am-4:30 pm Fri 7:00 am -11:30 AM Sat-Sun 7:00 am-11:30 am

## 2018-09-01 NOTE — Plan of Care (Signed)
  Problem: Activity: Goal: Risk for activity intolerance will decrease Outcome: Progressing   Problem: Pain Managment: Goal: General experience of comfort will improve Outcome: Progressing   

## 2018-09-02 ENCOUNTER — Encounter (HOSPITAL_COMMUNITY): Payer: Self-pay | Admitting: Diagnostic Radiology

## 2018-09-02 ENCOUNTER — Inpatient Hospital Stay (HOSPITAL_COMMUNITY): Payer: Medicare Other

## 2018-09-02 HISTORY — PX: IR SINUS/FIST TUBE CHK-NON GI: IMG673

## 2018-09-02 LAB — CBC
HCT: 35 % — ABNORMAL LOW (ref 36.0–46.0)
Hemoglobin: 11.1 g/dL — ABNORMAL LOW (ref 12.0–15.0)
MCH: 26.9 pg (ref 26.0–34.0)
MCHC: 31.7 g/dL (ref 30.0–36.0)
MCV: 85 fL (ref 80.0–100.0)
Platelets: 314 10*3/uL (ref 150–400)
RBC: 4.12 MIL/uL (ref 3.87–5.11)
RDW: 14.9 % (ref 11.5–15.5)
WBC: 8.9 10*3/uL (ref 4.0–10.5)
nRBC: 0 % (ref 0.0–0.2)

## 2018-09-02 MED ORDER — IOHEXOL 300 MG/ML  SOLN
INTRAMUSCULAR | Status: AC | PRN
  Administered 2018-09-02: 5 mL

## 2018-09-02 MED ORDER — BOOST / RESOURCE BREEZE PO LIQD CUSTOM
1.0000 | Freq: Two times a day (BID) | ORAL | Status: DC
  Administered 2018-09-02 – 2018-09-17 (×20): 1 via ORAL

## 2018-09-02 MED ORDER — LIDOCAINE HCL 1 % IJ SOLN
INTRAMUSCULAR | Status: AC
  Filled 2018-09-02: qty 20

## 2018-09-02 MED ORDER — ENSURE ENLIVE PO LIQD
237.0000 mL | Freq: Two times a day (BID) | ORAL | Status: DC
  Administered 2018-09-02 – 2018-09-17 (×17): 237 mL via ORAL

## 2018-09-02 NOTE — Procedures (Signed)
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     Julie Richards R. Anselm Pancoast, MD  Pager: [phone]

## 2018-09-02 NOTE — Progress Notes (Signed)
[facility] Progress Note     Subjective: CC-  Comfortable this morning. Abdominal pain continues to slowly improve. Denies n/v. Tolerated soft diet yesterday but not eating much. BM yesterday. Going for drain injection study today.  Objective: Vital signs in last 24 hours: Temp:  [98.1 F (36.7 C)-98.5 F (36.9 C)] 98.1 F (36.7 C) (08/12 0529) Pulse Rate:  [81-104] 81 (08/12 0529) Resp:  [18] 18 (08/11 1552) BP: (125-155)/(73-106) 125/80 (08/12 0529) SpO2:  [96 %-100 %] 98 % (08/12 0529) Last BM Date: 08/28/18  Intake/Output from previous day: 08/11 0701 - 08/12 0700 In: 237 [P.O.:237] Out: -  Intake/Output this shift: No intake/output data recorded.  PE: Gen:  Alert, NAD, pleasant HEENT: EOM's intact, pupils equal and round Pulm:  Rate and effort normal Abd: Soft, ND,nontender, +BS, IR drain in place with tan fluid in bulb (no output recorded last 24 hours) Ext: no BLE edema Psych: A&Ox3  Skin: no rashes noted, warm and dry   Lab Results:  Recent Labs    09/01/18 1317 09/02/18 0445  WBC 10.3 8.9  HGB 11.2* 11.1*  HCT 35.2* 35.0*  PLT 311 314   BMET Recent Labs    09/01/18 1317  NA 141  K 3.3*  CL 104  CO2 25  GLUCOSE 101*  BUN 5*  CREATININE 0.76  CALCIUM 9.0   PT/INR No results for input(s): LABPROT, INR in the last 72 hours. CMP     Component Value Date/Time   NA 141 09/01/2018 1317   K 3.3 (L) 09/01/2018 1317   CL 104 09/01/2018 1317   CO2 25 09/01/2018 1317   GLUCOSE 101 (H) 09/01/2018 1317   BUN 5 (L) 09/01/2018 1317   CREATININE 0.76 09/01/2018 1317   CALCIUM 9.0 09/01/2018 1317   PROT 7.1 08/25/2018 1329   ALBUMIN 3.3 (L) 08/25/2018 1329   AST 18 08/25/2018 1329   ALT 24 08/25/2018 1329   ALKPHOS 88 08/25/2018 1329   BILITOT 1.6 (H) 08/25/2018 1329   GFRNONAA >60 09/01/2018 1317   GFRAA >60 09/01/2018 1317   Lipase     Component Value Date/Time   LIPASE 24 08/25/2018 1329       Studies/Results: Ct  Abdomen Pelvis W Contrast  Result Date: 08/31/2018 CLINICAL DATA:  Pt c/o continued presence of intermitted sharp stabbing abdominal pains. Reports that pain feels deep, not superficial at drain site. Denies sensation of bowel crampsHx of appy with infection and drains EXAM: CT ABDOMEN AND PELVIS WITH CONTRAST TECHNIQUE: Multidetector CT imaging of the abdomen and pelvis was performed using the standard protocol following bolus administration of intravenous contrast. CONTRAST:  100mL OMNIPAQUE IOHEXOL 300 MG/ML  SOLN COMPARISON:  08/25/2018 FINDINGS: Lower chest: Mild dependent atelectasis.  No acute findings. Hepatobiliary: Liver normal in size. 17 mm low-density lesion, posterior right lobe, consistent with a cyst and stable. There are small additional more subtle low-density lesions in the right lobe that are also stable consistent with cysts. No other liver masses or lesions. Gallbladder is unremarkable. No bile duct dilation. Pancreas: Unremarkable. No pancreatic ductal dilatation or surrounding inflammatory changes. Spleen: Normal in size without focal abnormality. Adrenals/Urinary Tract: No adrenal masses. Kidneys are normal in size, orientation and position. There are 2 adjacent nonobstructing stones in the mid to upper pole of the right kidney. No other intrarenal stones. No masses. No hydronephrosis. Normal ureters. Bladder is unremarkable. Stomach/Bowel: Pigtail catheter has been placed through the right lower quadrant anterior abdominal wall. Pigtail curls along the  medial margin of the cecum. There is adjacent inflammation. The fluid collection noted in this location on the prior CT has been evacuated. There is a small fluid collection that is now evident along the adjacent anterolateral peritoneal cavity, abutting the anterolateral cecum. This measures 5.7 cm from superior to inferior by 3.4 x 1.5 cm transversely. Inflammatory wall thickening is noted along the cecum and inferior ascending colon. No  other colonic wall thickening or adjacent inflammation. Small bowel is normal in caliber. No wall thickening or inflammation. The patient has a lap band, which lies approximately 6 cm below the gastroesophageal junction, and circling the upper stomach, unchanged compared to the prior CT. No stomach wall thickening or inflammation. Vascular/Lymphatic: No significant vascular findings are present. No enlarged abdominal or pelvic lymph nodes. Reproductive: Uterus normal in size. Small calcified fibroid along the anterior uterine fundus. No adnexal masses. Other: No ascites or hernia. Musculoskeletal: No fracture or acute finding. No osteoblastic or osteolytic lesions. IMPRESSION: 1. Right lower quadrant pigtail catheter has evacuated the collection noted on the prior CT, along the medial margin of the cecum. 2. There is a smaller new collection along the anterolateral peritoneal cavity abutting the anterior margin of the cecum, measuring 5.7 x 1.5 x 3.4 cm. Persistent inflammatory changes are noted in the right lower quadrant adjacent to the pigtail catheter, cecum and lower ascending colon. 3. Lap band has slipped inferiorly on the stomach as described above. Electronically Signed   By: Amie Portlandavid  Ormond M.D.   On: 08/31/2018 19:22    Anti-infectives: Anti-infectives (From admission, onward)   Start     Dose/Rate Route Frequency Ordered Stop   08/28/18 0000  amoxicillin-clavulanate (AUGMENTIN) 875-125 MG tablet     1 tablet Oral 2 times daily 08/28/18 1530 09/11/18 2359   08/26/18 0600  piperacillin-tazobactam (ZOSYN) IVPB 3.375 g     3.375 g 12.5 mL/hr over 240 Minutes Intravenous Every 8 hours 08/25/18 2141     08/25/18 2115  ceFEPIme (MAXIPIME) 2 g in sodium chloride 0.9 % 100 mL IVPB     2 g 200 mL/hr over 30 Minutes Intravenous  Once 08/25/18 2102 08/25/18 2229   08/25/18 2115  metroNIDAZOLE (FLAGYL) IVPB 500 mg     500 mg 100 mL/hr over 60 Minutes Intravenous  Once 08/25/18 2102 08/25/18 2257        Assessment/Plan Hx of gastric banding - placed >[place] years ago in [place] as part of a study. Lap band noted to slip inferiorly on stomach on CT. Reviewed with Dr. Andrey CampanileWilson who advised follow up as this could put her at risk for ulcerations and perforation. Severe malnutrition - prealbumin 10.7 (8/11), dietician to consult  Perforated appendicitis  Recurrent5cmRLQ intra-abdominal abscess- s/p drain 8/5 New RLQ abscess -  5.7 x 1.5 x 3.4 cm, seen on CT 8/10 - Admitted 6/18 for perforated appendicitis. Tx with abx  - Readmitted 6/27 with right lower quadrant intra-abdominal abscess - IR drain placement 6/29. IR drain removed 7/23.  - CT 8/4 recurrent right lower quadrant abscess measuring5.4x 4.5 x 5.3 cm - IR replaced drain 8/5, now with minimal output and CT scan shows this abscess has resolved >> culture growing E coli, pip/tazo sensitive but ampicillin/sulbactam intermediate - CT 8/10 shows that the initial abscess that was drained has resolved, but there is persistent inflammatory changes in the RLQ and a new fluid collection 5.7 x 1.5 x 3.4 cm along the anterolateral peritoneal cavity abutting the anterior margin of the cecum  FEN -IVF, soft diet VTE -SCDs, lovenox ID -Zosyn 8/5>> Foley - none Follow up - Dr. Brantley Stage, IR  Plan: IR for drain injection study today to evaluate for fistula. Likely would need surgery this admission if persistent fistula. If no fistula may be able to continue conservative treatment for now and attempt to improve nutritional status. Will ask dietician to see.  Consult ID for assistance with antibiotics. She was previously discharged on augmentin but this may not be the best oral antibiotics.   LOS: 7 days    [name] , [facility] 09/02/2018, 8:32 AM Pager: [phone] Mon-Thurs 7:00 am-4:30 pm Fri 7:00 am -11:30 AM Sat-Sun 7:00 am-11:30 am

## 2018-09-02 NOTE — Progress Notes (Signed)
Initial Nutrition Assessment  DOCUMENTATION CODES:   Morbid obesity  INTERVENTION:  Provide Ensure Enlive po BID, each supplement provides 350 kcal and 20 grams of protein  Provide Boost Breeze po BID, each supplement provides 250 kcal and 9 grams of protein.  Encourage adequate PO intake.   24 hour calorie count initiated.   NUTRITION DIAGNOSIS:   Increased nutrient needs related to acute illness(abdominal abscess) as evidenced by estimated needs.  GOAL:   Patient will meet greater than or equal to 90% of their needs  MONITOR:   PO intake, Supplement acceptance, Skin, Weight trends, Labs, I & O's  REASON FOR ASSESSMENT:   Consult Assessment of nutrition requirement/status  ASSESSMENT:   68 year old woman who was just discharged from our service 1 month ago with a percutaneous drain in place for treatment of perforated appendicitis presents with abdominal pain. Pt with recurrentRLQintra-abdominal abscess.   Pt underwent drain injection study today to evaluation for fistula. 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 Per MD, pt likely require surgery, ileocecectomy. RD consulted for low prealbumin. Calorie count initiated by MD to see if pt can maintain her nutrition needs. Plans for repeat prealbumin prior to surgery next week. MD suspects low prealbumin likely related to current acute illness rather than chronic malnutrition.   Pt reports having a low appetite however still tries to consume at least 2 meals a day with snacks in between. Pt reports dislike of most protein animal meats, however consumes chicken, dairy products, nuts, and eggs. Pt educated on the importance of adequate caloric and protein needs. Meal completion 100%, however pt reports n/v after consumption of meal yesterday. Pt with no weight loss per weight records. Pt currently has Ensure ordered and has been consuming them. Pt additionally reports favoring Boost Breeze as well. RD to order. RD to follow up tomorrow  with 24 hour caloric count results.   Labs and medications reviewed.   NUTRITION - FOCUSED PHYSICAL EXAM:    Most Recent Value  Orbital Region  No depletion  Upper Arm Region  No depletion  Thoracic and Lumbar Region  No depletion  Buccal Region  No depletion  Temple Region  No depletion  Clavicle Bone Region  No depletion  Clavicle and Acromion Bone Region  No depletion  Scapular Bone Region  No depletion  Dorsal Hand  No depletion  Patellar Region  No depletion  Anterior Thigh Region  No depletion  Posterior Calf Region  No depletion  Edema (RD Assessment)  Unable to assess  Hair  Reviewed  Eyes  Reviewed  Mouth  Reviewed  Skin  Reviewed  Nails  Reviewed      Diet Order:   Diet Order            DIET SOFT Room service appropriate? Yes; Fluid consistency: Thin  Diet effective now              EDUCATION NEEDS:   Education needs have been addressed  Skin:  Skin Assessment: Skin Integrity Issues: Skin Integrity Issues:: Incisions Incisions: abdomen  Last BM:  8/11  Height:   Ht Readings from Last 1 Encounters:  08/25/18 5\' 9"  (1.753 m)    Weight:   Wt Readings from Last 1 Encounters:  08/25/18 135 kg    Ideal Body Weight:  65.9 kg  BMI:  Body mass index is 43.95 kg/m.  Estimated Nutritional Needs:   Kcal:  1850-2050  Protein:  100-110 grams  Fluid:  1.8 -2 L/day    [place]  Cecilie Lowers, MS, RD, LDN Pager # [phone] After hours/ weekend pager # [phone]

## 2018-09-02 NOTE — Plan of Care (Signed)

## 2018-09-03 MED ORDER — POLYETHYLENE GLYCOL 3350 17 G PO PACK: 17.0000 g | PACK | Freq: Every day | ORAL | Status: DC | PRN

## 2018-09-03 NOTE — Progress Notes (Signed)
Referring Physician(s): Darnelle SpangleWakefield,M  Supervising Physician: Irish LackYamagata, Glenn  Patient Status:  [facility]  Chief Complaint:  Abdominal pain  Subjective: Pt doing fairly well; has some abd soreness but improved; has ambulated; denies N/V   Allergies: Percocet [oxycodone-acetaminophen]  Medications: Prior to Admission medications   Medication Sig Start Date End Date Taking? Authorizing Provider  Cholecalciferol (VITAMIN D3 PO) Take 1 capsule by mouth daily.    Yes [provider]  Melatonin 5 MG TABS Take 5 mg by mouth every evening. 07/27/18  Yes [provider]  Multiple Vitamin (MULTIVITAMIN WITH MINERALS) TABS tablet Take 1 tablet by mouth daily.   Yes [provider]  OVER THE COUNTER MEDICATION Take 1 capsule by mouth daily. Mega Red   Yes [provider]  Potassium 99 MG TABS Take 1 tablet by mouth daily.   Yes [provider]  acetaminophen (TYLENOL) 325 MG tablet Take 2 tablets (650 mg total) by mouth every 6 (six) hours as needed for mild pain. Patient not taking: Reported on 08/25/2018 07/16/18   Rayburn, Tresa EndoKelly A, PA-C  amoxicillin-clavulanate (AUGMENTIN) 875-125 MG tablet Take 1 tablet by mouth 2 (two) times daily for 14 days. 08/28/18 09/11/18  Barnetta Chapelsborne, Kelly, PA-C  ibuprofen (ADVIL) 400 MG tablet Take 1 tablet (400 mg total) by mouth every 6 (six) hours as needed for moderate pain. Patient not taking: Reported on 08/25/2018 07/16/18   Rayburn, Tresa EndoKelly A, PA-C  ondansetron (ZOFRAN-ODT) 4 MG disintegrating tablet Take 1 tablet (4 mg total) by mouth every 6 (six) hours as needed for nausea. Patient not taking: Reported on 08/25/2018 07/27/18   Jerre SimonFocht, Jessica L, PA  traMADol (ULTRAM) 50 MG tablet Take 1-2 tablets (50-100 mg total) by mouth every 6 (six) hours as needed for moderate pain or severe pain. Patient not taking: Reported on 08/25/2018 07/16/18   Rayburn, Tresa EndoKelly A, PA-C  traMADol (ULTRAM) 50 MG tablet Take 1 tablet (50 mg total) by  mouth every 6 (six) hours as needed for moderate pain or severe pain. 08/28/18   Barnetta Chapelsborne, Kelly, PA-C     Vital Signs: BP 125/63 (BP Location: Right Arm)    Pulse 84    Temp 98.4 F (36.9 C) (Oral)    Resp 16    Ht 5\' 9"  (1.753 m)    Wt 297 lb 9.9 oz (135 kg)    SpO2 98%    BMI 43.95 kg/m   Physical Exam awake/alert; RLQ drain intact, insertion site ok, mildly tender, output minimal amt turbid, beige colored fluid; drain flushed with output= flush amt Imaging: Ct Abdomen Pelvis W Contrast  Result Date: 08/31/2018 CLINICAL DATA:  Pt c/o continued presence of intermitted sharp stabbing abdominal pains. Reports that pain feels deep, not superficial at drain site. Denies sensation of bowel crampsHx of appy with infection and drains EXAM: CT ABDOMEN AND PELVIS WITH CONTRAST TECHNIQUE: Multidetector CT imaging of the abdomen and pelvis was performed using the standard protocol following bolus administration of intravenous contrast. CONTRAST:  100mL OMNIPAQUE IOHEXOL 300 MG/ML  SOLN COMPARISON:  08/25/2018 FINDINGS: Lower chest: Mild dependent atelectasis.  No acute findings. Hepatobiliary: Liver normal in size. 17 mm low-density lesion, posterior right lobe, consistent with a cyst and stable. There are small additional more subtle low-density lesions in the right lobe that are also stable consistent with cysts. No other liver masses or lesions. Gallbladder is unremarkable. No bile duct dilation. Pancreas: Unremarkable. No pancreatic ductal dilatation or surrounding inflammatory changes. Spleen: Normal in  size without focal abnormality. Adrenals/Urinary Tract: No adrenal masses. Kidneys are normal in size, orientation and position. There are 2 adjacent nonobstructing stones in the mid to upper pole of the right kidney. No other intrarenal stones. No masses. No hydronephrosis. Normal ureters. Bladder is unremarkable. Stomach/Bowel: Pigtail catheter has been placed through the right lower quadrant anterior  abdominal wall. Pigtail curls along the medial margin of the cecum. There is adjacent inflammation. The fluid collection noted in this location on the prior CT has been evacuated. There is a small fluid collection that is now evident along the adjacent anterolateral peritoneal cavity, abutting the anterolateral cecum. This measures 5.7 cm from superior to inferior by 3.4 x 1.5 cm transversely. Inflammatory wall thickening is noted along the cecum and inferior ascending colon. No other colonic wall thickening or adjacent inflammation. Small bowel is normal in caliber. No wall thickening or inflammation. The patient has a lap band, which lies approximately 6 cm below the gastroesophageal junction, and circling the upper stomach, unchanged compared to the prior CT. No stomach wall thickening or inflammation. Vascular/Lymphatic: No significant vascular findings are present. No enlarged abdominal or pelvic lymph nodes. Reproductive: Uterus normal in size. Small calcified fibroid along the anterior uterine fundus. No adnexal masses. Other: No ascites or hernia. Musculoskeletal: No fracture or acute finding. No osteoblastic or osteolytic lesions. IMPRESSION: 1. Right lower quadrant pigtail catheter has evacuated the collection noted on the prior CT, along the medial margin of the cecum. 2. There is a smaller new collection along the anterolateral peritoneal cavity abutting the anterior margin of the cecum, measuring 5.7 x 1.5 x 3.4 cm. Persistent inflammatory changes are noted in the right lower quadrant adjacent to the pigtail catheter, cecum and lower ascending colon. 3. Lap band has slipped inferiorly on the stomach as described above. Electronically Signed   By: Amie Portlandavid  Ormond M.D.   On: 08/31/2018 19:22   Ir Sinus/fist Tube Chk-non Gi  Result Date: 09/02/2018 INDICATION: 68 year old with history of ruptured appendicitis and percutaneous drain. Evaluate for fistula connection. EXAM: SINUS TRACT INJECTION /  FISTULOGRAM COMPARISON:  None. MEDICATIONS: None ANESTHESIA/SEDATION: None COMPLICATIONS: None immediate. CONTRAST:  5 mL Omnipaque 300 TECHNIQUE: Informed written consent was obtained from the patient after a thorough discussion of the procedural risks, benefits and alternatives. A timeout was performed prior to the initiation of the procedure. PROCEDURE: The existing drain was injected with contrast under fluoroscopic evaluation. Contrast was aspirated at the end of the procedure and the catheter was flushed with saline and attached to gravity bag. FINDINGS: Drain in the right lower quadrant. Contrast fills around the pigtail catheter but there is no significant abscess collection. Some of the contrast drains around the tube to the skin surface. However, there is also a fistula tract to the cecum. 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 Electronically Signed   By: Richarda OverlieAdam  Henn M.D.   On: 09/02/2018 12:15    Labs:  CBC: Recent Labs    08/28/18 1033 08/30/18 0506 09/01/18 1317 09/02/18 0445  WBC 14.6* 8.4 10.3 8.9  HGB 11.1* 10.6* 11.2* 11.1*  HCT 34.9* 32.8* 35.2* 35.0*  PLT 200 235 311 314    COAGS: Recent Labs    07/19/18 0818 08/26/18 0200  INR 1.2 1.4*  APTT  --  31    BMP: Recent Labs    07/24/18 0340 07/25/18 0336 08/25/18 1329 08/26/18 0200 09/01/18 1317  NA 137  --  138 136 141  K 3.2*  --  4.1 3.7 3.3*  CL 101  --  103 102 104  CO2 27  --  26 24 25   GLUCOSE 109*  --  119* 121* 101*  BUN 7*  --  12 12 5*  CALCIUM 8.5*  --  9.1 8.7* 9.0  CREATININE 0.78 0.74 0.89 0.79 0.76  GFRNONAA >60 >60 >60 >60 >60  GFRAA >60 >60 >60 >60 >60    LIVER FUNCTION TESTS: Recent Labs    07/21/18 0818 07/22/18 0156 07/23/18 0530 08/25/18 1329  BILITOT 1.1 0.7 0.4 1.6*  AST 12* 12* 15 18  ALT 19 15 15 24   ALKPHOS 54 56 55 88  PROT 6.2* 6.3* 6.7 7.1  ALBUMIN 2.1* 2.2* 2.2* 3.3*    Assessment and Plan: Pt with hx ruptured  appendicitis with assoc recurrent abscess, s/p latest RLQ drain placement on 8/5; 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 Rowe Robert, PA-C 09/03/2018, 11:03 AM   I spent a total of 20 minutes at the the patient's bedside AND on the patient's hospital floor or unit, greater than 50% of which was counseling/coordinating care for right lower abdominal abscess drain    Patient ID: Julie Richards, female   DOB: [birth year], 68 y.o.   MRN: [id number]

## 2018-09-03 NOTE — Progress Notes (Signed)
[facility] Progress Note     Subjective: CC-  Feeling well today. Continues to have intermittent sharp RLQ pains, but they are less frequent. Typically occur when she needs to have a BM. Tolerating diet. Calorie count initiated yesterday. Started having loose stools yesterday. Already ambulated 2 laps in the hall this morning. Drain injection study yesterday showed fistula to cecum.  Objective: Vital signs in last 24 hours: Temp:  [98.2 F (36.8 C)-98.5 F (36.9 C)] 98.4 F (36.9 C) (08/13 0825) Pulse Rate:  [65-92] 84 (08/13 0825) Resp:  [16-18] 16 (08/13 0825) BP: (123-142)/(63-85) 125/63 (08/13 0825) SpO2:  [98 %-99 %] 98 % (08/13 0825) Last BM Date: 09/01/18  Intake/Output from previous day: 08/12 0701 - 08/13 0700 In: 820 [P.O.:820] Out: 0  Intake/Output this shift: No intake/output data recorded.  PE: Gen: Alert, NAD, pleasant HEENT: EOM's intact, pupils equal and round Pulm:Rate andeffort normal GNF:AOZH, ND,nontender, +BS,IR drain in placewith scant thin/tan fluid in bulb (0cc recorded last 24 hours) Ext:no BLE edema Psych: A&Ox3  Skin: no rashes noted, warm and dry    Lab Results:  Recent Labs    09/01/18 1317 09/02/18 0445  WBC 10.3 8.9  HGB 11.2* 11.1*  HCT 35.2* 35.0*  PLT 311 314   BMET Recent Labs    09/01/18 1317  NA 141  K 3.3*  CL 104  CO2 25  GLUCOSE 101*  BUN 5*  CREATININE 0.76  CALCIUM 9.0   PT/INR No results for input(s): LABPROT, INR in the last 72 hours. CMP     Component Value Date/Time   NA 141 09/01/2018 1317   K 3.3 (L) 09/01/2018 1317   CL 104 09/01/2018 1317   CO2 25 09/01/2018 1317   GLUCOSE 101 (H) 09/01/2018 1317   BUN 5 (L) 09/01/2018 1317   CREATININE 0.76 09/01/2018 1317   CALCIUM 9.0 09/01/2018 1317   PROT 7.1 08/25/2018 1329   ALBUMIN 3.3 (L) 08/25/2018 1329   AST 18 08/25/2018 1329   ALT 24 08/25/2018 1329   ALKPHOS 88 08/25/2018 1329   BILITOT 1.6 (H) 08/25/2018 1329   GFRNONAA >60 09/01/2018 1317   GFRAA >60 09/01/2018 1317   Lipase     Component Value Date/Time   LIPASE 24 08/25/2018 1329       Studies/Results: Ir Sinus/fist Tube Chk-non Gi  Result Date: 09/02/2018 INDICATION: 68 year old with history of ruptured appendicitis and percutaneous drain. Evaluate for fistula connection. EXAM: SINUS TRACT INJECTION / FISTULOGRAM COMPARISON:  None. MEDICATIONS: None ANESTHESIA/SEDATION: None COMPLICATIONS: None immediate. CONTRAST:  5 mL Omnipaque 300 TECHNIQUE: Informed written consent was obtained from the patient after a thorough discussion of the procedural risks, benefits and alternatives. A timeout was performed prior to the initiation of the procedure. PROCEDURE: The existing drain was injected with contrast under fluoroscopic evaluation. Contrast was aspirated at the end of the procedure and the catheter was flushed with saline and attached to gravity bag. FINDINGS: Drain in the right lower quadrant. Contrast fills around the pigtail catheter but there is no significant abscess collection. Some of the contrast drains around the tube to the skin surface. However, there is also a fistula tract to the cecum. 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 Electronically Signed   By: Markus Daft M.D.   On: 09/02/2018 12:15    Anti-infectives: Anti-infectives (From admission, onward)   Start     Dose/Rate Route Frequency Ordered Stop   08/28/18 0000  amoxicillin-clavulanate (  AUGMENTIN) 875-125 MG tablet     1 tablet Oral 2 times daily 08/28/18 1530 09/11/18 2359   08/26/18 0600  piperacillin-tazobactam (ZOSYN) IVPB 3.375 g     3.375 g 12.5 mL/hr over 240 Minutes Intravenous Every 8 hours 08/25/18 2141     08/25/18 2115  ceFEPIme (MAXIPIME) 2 g in sodium chloride 0.9 % 100 mL IVPB     2 g 200 mL/hr over 30 Minutes Intravenous  Once 08/25/18 2102 08/25/18 2229   08/25/18 2115  metroNIDAZOLE (FLAGYL) IVPB 500 mg      500 mg 100 mL/hr over 60 Minutes Intravenous  Once 08/25/18 2102 08/25/18 2257       Assessment/Plan Hx of gastric banding- [place] of [place]. Lap band noted to slip inferiorly on stomach on CT. Reviewed with Dr. Andrey CampanileWilson who advised follow up as this could put her at risk for ulcerations and perforation.  Severe malnutrition - prealbumin 10.7 (8/11), appreciate dietician assistance with improving enteral nutrition. 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 Tx with abx  - Readmitted 6/27 with right lower quadrant intra-abdominal abscess - IR drain placement 6/29. IR drain removed 7/23.  - CT 8/4 recurrent right lower quadrant abscess measuring5.4x 4.5 x 5.3 cm - IR replaced drain 8/5, now with minimal output and CT scan shows this abscess has resolved >> culture growing E coli, pip/tazo sensitive but ampicillin/sulbactam intermediate - CT 8/10 shows that the initial abscess that was drained has resolved, but there is persistent inflammatory changes in the RLQ and a new fluid collection5.7 x 1.5 x 3.4 cmalong the anterolateral peritoneal cavity abutting the anterior margin of the cecum - drain injection study 8/12 shows fistula to cecum  FEN -IVF, soft diet, Boost, Ensure VTE -SCDs, lovenox ID -Zosyn 8/5>> Foley - none Follow up - Dr. Luisa Hartornett, IR  Plan: Continue calorie count, will follow up on dietician recommendations. Continue drain and IV zosyn. Mobilize. Likely plan to repeat prealbumin over the weekend and surgery next week.   LOS: 8 days    Julie Richards , [facility] 09/03/2018, 9:47 AM Pager: [phone] Mon-Thurs 7:00 am-4:30 pm Fri 7:00 am -11:30 AM Sat-Sun 7:00 am-11:30 am

## 2018-09-03 NOTE — Progress Notes (Signed)
Pt refusing stool softeners and laxatives due to loose stools.

## 2018-09-03 NOTE — Plan of Care (Signed)
  Problem: Pain Managment: Goal: General experience of comfort will improve Outcome: Progressing   

## 2018-09-03 NOTE — Progress Notes (Signed)
Calorie Count Note  24 hour calorie count ordered.  Diet: Soft diet with thin liquids.  Supplements:   Ensure Enlive po BID, each supplement provides 350 kcal and 20 grams of protein  Boost Breeze po BID, each supplement provides 250 kcal and 9 grams of protein  Breakfast: 522 kcal, 18 grams of protein Lunch: 627 kcal, 26 grams of protein Dinner: none Supplements: 1200 kcal, 58 grams of protein  Total intake: 2349 kcal (100% of minimum kcal needs)  102 grams of protein (100% of minimum protein needs)  Estimated Nutritional Needs:  Kcal:  1850-2050 Protein:  100-110 grams Fluid:  1.8 -2 L/day  Meal completion has been 100%. Pt with no abdominal pains or discomfort during time of visit. Pt currently has nutritional supplements ordered and has been consuming them. RD to continue with current orders to aid in adequate caloric and protein needs.   Nutrition Dx:  Increased nutrient needs related to acute illness(abdominal abscess) as evidenced by estimated needs; ongoing  Goal:  Pt to meet >/= 90% of their estimated nutrition needs; met  Intervention:    Discontinue calorie count   Continue Ensure Enlive po BID, each supplement provides 350 kcal and 20 grams of protein.   Continue Boost Breeze po BID, each supplement provides 250 kcal and 9 grams of protein.   Encourage adequate PO intake.   Corrin Parker, MS, RD, LDN Pager # [phone] After hours/ weekend pager # [phone]

## 2018-09-04 NOTE — Progress Notes (Signed)
[facility] Progress Note     Subjective: CC-  Up in chair. Feeling the same as yesterday. Abdominal pain about the same. She reports few intermittent right sided abdominal pain. Denies n/v. BM yesterday. Tolerating soft diet and protein supplements. Calorie count showed that she should be able to meet 100% of her caloric and protein needs enterally.  Objective: Vital signs in last 24 hours: Temp:  [98.1 F (36.7 C)-98.4 F (36.9 C)] 98.3 F (36.8 C) (08/14 0501) Pulse Rate:  [63-83] 63 (08/14 0501) Resp:  [16-18] 18 (08/14 0501) BP: (119-141)/(75-85) 141/85 (08/14 0501) SpO2:  [97 %-100 %] 97 % (08/14 0501) Last BM Date: 09/02/18  Intake/Output from previous day: 08/13 0701 - 08/14 0700 In: 485 [P.O.:480] Out: 10 [Urine:10] Intake/Output this shift: No intake/output data recorded.  PE: Gen: Alert, NAD, pleasant HEENT: EOM's intact, pupils equal and round Pulm:Rate andeffort normal QVZ:DGLO, ND,nontender, +BS,IR drain in placewithscant thin/tan fluid in bulb (0cc recordedlast 24 hours) Ext:no BLE edema Psych: A&Ox3  Skin: no rashes noted, warm and dry    Lab Results:  Recent Labs    09/01/18 1317 09/02/18 0445  WBC 10.3 8.9  HGB 11.2* 11.1*  HCT 35.2* 35.0*  PLT 311 314   BMET Recent Labs    09/01/18 1317  NA 141  K 3.3*  CL 104  CO2 25  GLUCOSE 101*  BUN 5*  CREATININE 0.76  CALCIUM 9.0   PT/INR No results for input(s): LABPROT, INR in the last 72 hours. CMP     Component Value Date/Time   NA 141 09/01/2018 1317   K 3.3 (L) 09/01/2018 1317   CL 104 09/01/2018 1317   CO2 25 09/01/2018 1317   GLUCOSE 101 (H) 09/01/2018 1317   BUN 5 (L) 09/01/2018 1317   CREATININE 0.76 09/01/2018 1317   CALCIUM 9.0 09/01/2018 1317   PROT 7.1 08/25/2018 1329   ALBUMIN 3.3 (L) 08/25/2018 1329   AST 18 08/25/2018 1329   ALT 24 08/25/2018 1329   ALKPHOS 88 08/25/2018 1329   BILITOT 1.6 (H) 08/25/2018 1329   GFRNONAA >60 09/01/2018  1317   GFRAA >60 09/01/2018 1317   Lipase     Component Value Date/Time   LIPASE 24 08/25/2018 1329       Studies/Results: Ir Sinus/fist Tube Chk-non Gi  Result Date: 09/02/2018 INDICATION: 68 year old with history of ruptured appendicitis and percutaneous drain. Evaluate for fistula connection. EXAM: SINUS TRACT INJECTION / FISTULOGRAM COMPARISON:  None. MEDICATIONS: None ANESTHESIA/SEDATION: None COMPLICATIONS: None immediate. CONTRAST:  5 mL Omnipaque 300 TECHNIQUE: Informed written consent was obtained from the patient after a thorough discussion of the procedural risks, benefits and alternatives. A timeout was performed prior to the initiation of the procedure. PROCEDURE: The existing drain was injected with contrast under fluoroscopic evaluation. Contrast was aspirated at the end of the procedure and the catheter was flushed with saline and attached to gravity bag. FINDINGS: Drain in the right lower quadrant. Contrast fills around the pigtail catheter but there is no significant abscess collection. Some of the contrast drains around the tube to the skin surface. However, there is also a fistula tract to the cecum. 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 Electronically Signed   By: Markus Daft M.D.   On: 09/02/2018 12:15    Anti-infectives: Anti-infectives (From admission, onward)   Start     Dose/Rate Route Frequency Ordered Stop   08/28/18 0000  amoxicillin-clavulanate (AUGMENTIN) 875-125 MG tablet  1 tablet Oral 2 times daily 08/28/18 1530 09/11/18 2359   08/26/18 0600  piperacillin-tazobactam (ZOSYN) IVPB 3.375 g     3.375 g 12.5 mL/hr over 240 Minutes Intravenous Every 8 hours 08/25/18 2141     08/25/18 2115  ceFEPIme (MAXIPIME) 2 g in sodium chloride 0.9 % 100 mL IVPB     2 g 200 mL/hr over 30 Minutes Intravenous  Once 08/25/18 2102 08/25/18 2229   08/25/18 2115  metroNIDAZOLE (FLAGYL) IVPB 500 mg     500 mg 100 mL/hr over 60  Minutes Intravenous  Once 08/25/18 2102 08/25/18 2257       Assessment/Plan Hx of gastric banding- placed >[place] years ago in [place] as part of a study. Lap band noted to slip inferiorly on stomach on CT. Reviewed with Dr. Andrey CampanileWilson who advised follow up as this could put her at risk for ulcerations and perforation.  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 Tx with abx  - Readmitted 6/27 with right lower quadrant intra-abdominal abscess - IR drain placement 6/29. IR drain removed 7/23.  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 Luisa Hartornett, IR  Plan: Continue current plan of care with drain and IV antibiotics. Continue soft diet and protein shakes. Mobilize. Plan to repeat prealbumin on Monday 8/17. If improving can hopefully plan for surgery next week. If not, could consider sending home for a period of time to continue working on nutrition and plan surgery later on.   LOS: 9 days    Franne FortsBrooke A Adoni Greenough , [facility] 09/04/2018, 9:18 AM Pager: [phone] Mon-Thurs 7:00 am-4:30 pm Fri 7:00 am  -11:30 AM Sat-Sun 7:00 am-11:30 am

## 2018-09-04 NOTE — Plan of Care (Signed)
  Problem: Education: Goal: Knowledge of General Education information will improve Description: Including pain rating scale, medication(s)/side effects and non-pharmacologic comfort measures Outcome: Progressing   Problem: Clinical Measurements: Goal: Will remain free from infection Outcome: Progressing   Problem: Nutrition: Goal: Adequate nutrition will be maintained Outcome: Progressing   

## 2018-09-04 NOTE — Progress Notes (Signed)
Chaplain responded to request from [facility], Dept. Of Spiritual Care.  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 She required a notary to sign papers for his school.  Chaplain and notary attended to her need. Chaplain also offered ministry of presence and prayer. Will continue to be available. Rev. Tamsen Snider Pager [phone]

## 2018-09-04 NOTE — Progress Notes (Signed)
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 Authorizing Provider  Cholecalciferol (VITAMIN D3 PO) Take 1 capsule by mouth daily.    Yes [provider]  Melatonin 5 MG TABS Take 5 mg by mouth every evening. 07/27/18  Yes [provider]  Multiple Vitamin (MULTIVITAMIN WITH MINERALS) TABS tablet Take 1 tablet by mouth daily.   Yes [provider]  OVER THE COUNTER MEDICATION Take 1 capsule by mouth daily. Mega Red   Yes [provider]  Potassium 99 MG TABS Take 1 tablet by mouth daily.   Yes [provider]  acetaminophen (TYLENOL) 325 MG tablet Take 2 tablets (650 mg total) by mouth every 6 (six) hours as needed for mild pain. Patient not taking: Reported on 08/25/2018 07/16/18   Rayburn, Tresa EndoKelly A, PA-C  amoxicillin-clavulanate (AUGMENTIN) 875-125 MG tablet Take 1 tablet by mouth 2 (two) times daily for 14 days. 08/28/18 09/11/18  Barnetta Chapelsborne, Kelly, PA-C  ibuprofen (ADVIL) 400 MG tablet Take 1 tablet (400 mg total) by mouth every 6 (six) hours as needed for moderate pain. Patient not taking: Reported on 08/25/2018 07/16/18   Rayburn, Tresa EndoKelly A, PA-C  ondansetron (ZOFRAN-ODT) 4 MG disintegrating tablet Take 1 tablet (4 mg total) by mouth every 6 (six) hours as needed for nausea. Patient not taking: Reported on 08/25/2018 07/27/18   Jerre SimonFocht, Jessica L, PA  traMADol (ULTRAM) 50 MG tablet Take 1-2 tablets (50-100 mg total) by mouth every 6 (six) hours as needed for moderate pain or  severe pain. Patient not taking: Reported on 08/25/2018 07/16/18   Rayburn, Tresa EndoKelly A, PA-C  traMADol (ULTRAM) 50 MG tablet Take 1 tablet (50 mg total) by mouth every 6 (six) hours as needed for moderate pain or severe pain. 08/28/18   Barnetta Chapelsborne, Kelly, PA-C     Vital Signs: BP (!) 141/85 (BP Location: Right Arm)    Pulse 63    Temp 98.3 F (36.8 C) (Oral)    Resp 18    Ht 5\' 9"  (1.753 m)    Wt 297 lb 9.9 oz (135 kg)    SpO2 97%    BMI 43.95 kg/m   Physical Exam Vitals signs reviewed.  Skin:    General: Skin is warm and dry.     Comments: Site of drain is clean and dry NT no bleeding OP in bag is minimal and serous color  Organism ID, Bacteria ESCHERICHIA COLI   Neurological:     Mental Status: She is alert.     Imaging: Ct Abdomen Pelvis W Contrast  Result Date: 08/31/2018 CLINICAL DATA:  Pt c/o continued presence of intermitted sharp stabbing abdominal pains. Reports that pain feels deep, not superficial at drain site. Denies sensation of bowel crampsHx of appy with infection and drains EXAM: CT ABDOMEN AND PELVIS WITH CONTRAST TECHNIQUE: Multidetector CT imaging of the abdomen and pelvis was performed using the standard protocol following bolus administration of intravenous contrast. CONTRAST:  100mL OMNIPAQUE IOHEXOL 300 MG/ML  SOLN COMPARISON:  08/25/2018 FINDINGS: Lower chest: Mild dependent atelectasis.  No acute findings. Hepatobiliary:  Liver normal in size. 17 mm low-density lesion, posterior right lobe, consistent with a cyst and stable. There are small additional more subtle low-density lesions in the right lobe that are also stable consistent with cysts. No other liver masses or lesions. Gallbladder is unremarkable. No bile duct dilation. Pancreas: Unremarkable. No pancreatic ductal dilatation or surrounding inflammatory changes. Spleen: Normal in size without focal abnormality. Adrenals/Urinary Tract: No adrenal masses. Kidneys are normal in size, orientation and position. There are 2  adjacent nonobstructing stones in the mid to upper pole of the right kidney. No other intrarenal stones. No masses. No hydronephrosis. Normal ureters. Bladder is unremarkable. Stomach/Bowel: Pigtail catheter has been placed through the right lower quadrant anterior abdominal wall. Pigtail curls along the medial margin of the cecum. There is adjacent inflammation. The fluid collection noted in this location on the prior CT has been evacuated. There is a small fluid collection that is now evident along the adjacent anterolateral peritoneal cavity, abutting the anterolateral cecum. This measures 5.7 cm from superior to inferior by 3.4 x 1.5 cm transversely. Inflammatory wall thickening is noted along the cecum and inferior ascending colon. No other colonic wall thickening or adjacent inflammation. Small bowel is normal in caliber. No wall thickening or inflammation. The patient has a lap band, which lies approximately 6 cm below the gastroesophageal junction, and circling the upper stomach, unchanged compared to the prior CT. No stomach wall thickening or inflammation. Vascular/Lymphatic: No significant vascular findings are present. No enlarged abdominal or pelvic lymph nodes. Reproductive: Uterus normal in size. Small calcified fibroid along the anterior uterine fundus. No adnexal masses. Other: No ascites or hernia. Musculoskeletal: No fracture or acute finding. No osteoblastic or osteolytic lesions. IMPRESSION: 1. Right lower quadrant pigtail catheter has evacuated the collection noted on the prior CT, along the medial margin of the cecum. 2. There is a smaller new collection along the anterolateral peritoneal cavity abutting the anterior margin of the cecum, measuring 5.7 x 1.5 x 3.4 cm. Persistent inflammatory changes are noted in the right lower quadrant adjacent to the pigtail catheter, cecum and lower ascending colon. 3. Lap band has slipped inferiorly on the stomach as described above. Electronically Signed    By: Amie Portlandavid  Ormond M.D.   On: 08/31/2018 19:22   Ir Sinus/fist Tube Chk-non Gi  Result Date: 09/02/2018 INDICATION: 68 year old with history of ruptured appendicitis and percutaneous drain. Evaluate for fistula connection. EXAM: SINUS TRACT INJECTION / FISTULOGRAM COMPARISON:  None. MEDICATIONS: None ANESTHESIA/SEDATION: None COMPLICATIONS: None immediate. CONTRAST:  5 mL Omnipaque 300 TECHNIQUE: Informed written consent was obtained from the patient after a thorough discussion of the procedural risks, benefits and alternatives. A timeout was performed prior to the initiation of the procedure. PROCEDURE: The existing drain was injected with contrast under fluoroscopic evaluation. Contrast was aspirated at the end of the procedure and the catheter was flushed with saline and attached to gravity bag. FINDINGS: Drain in the right lower quadrant. Contrast fills around the pigtail catheter but there is no significant abscess collection. Some of the contrast drains around the tube to the skin surface. However, there is also a fistula tract to the cecum. 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 Electronically Signed   By: Richarda OverlieAdam  Henn M.D.   On: 09/02/2018 12:15    Labs:  CBC: Recent Labs    08/28/18 1033 08/30/18 0506 09/01/18 1317 09/02/18 0445  WBC 14.6* 8.4 10.3 8.9  HGB 11.1* 10.6* 11.2* 11.1*  HCT 34.9* 32.8* 35.2* 35.0*  PLT 200 235 311 314    COAGS: Recent Labs    07/19/18 0818 08/26/18 0200  INR 1.2 1.4*  APTT  --  31    BMP: Recent Labs    07/24/18 0340 07/25/18 0336 08/25/18 1329 08/26/18 0200 09/01/18 1317  NA 137  --  138 136 141  K 3.2*  --  4.1 3.7 3.3*  CL 101  --  103 102 104  CO2 27  --  26 24 25   GLUCOSE 109*  --  119* 121* 101*  BUN 7*  --  12 12 5*  CALCIUM 8.5*  --  9.1 8.7* 9.0  CREATININE 0.78 0.74 0.89 0.79 0.76  GFRNONAA >60 >60 >60 >60 >60  GFRAA >60 >60 >60 >60 >60    LIVER FUNCTION TESTS: Recent Labs      07/21/18 0818 07/22/18 0156 07/23/18 0530 08/25/18 1329  BILITOT 1.1 0.7 0.4 1.6*  AST 12* 12* 15 18  ALT 19 15 15 24   ALKPHOS 54 56 55 88  PROT 6.2* 6.3* 6.7 7.1  ALBUMIN 2.1* 2.2* 2.2* 3.3*    Assessment and Plan:  Abscess drain intact Minimal OP For possible surgery next week Plan per [facility]  Electronically Signed: Lavonia Drafts, PA-C 09/04/2018, 10:27 AM   I spent a total of 15 Minutes at the the patient's bedside AND on the patient's hospital floor or unit, greater than 50% of which was counseling/coordinating care for abscess drain

## 2018-09-04 NOTE — Plan of Care (Signed)
  Problem: Education: Goal: Knowledge of General Education information will improve Description: Including pain rating scale, medication(s)/side effects and non-pharmacologic comfort measures Outcome: Progressing   Problem: Coping: Goal: Level of anxiety will decrease Outcome: Progressing   

## 2018-09-05 NOTE — Progress Notes (Signed)
Subjective/Chief Complaint: No complaints   Objective: Vital signs in last 24 hours: Temp:  [97.7 F (36.5 C)-98.4 F (36.9 C)] 97.7 F (36.5 C) (08/15 0525) Pulse Rate:  [75-89] 75 (08/15 0525) BP: (122-145)/(81-89) 122/81 (08/15 0525) SpO2:  [99 %] 99 % (08/15 0525) Last BM Date: 09/03/18  Intake/Output from previous day: 08/14 0701 - 08/15 0700 In: 730 [P.O.:720] Out: 15 [Urine:5; Drains:10] Intake/Output this shift: No intake/output data recorded.  General appearance: alert and cooperative Resp: clear to auscultation bilaterally Cardio: regular rate and rhythm GI: soft, mild tenderness at drain site  Lab Results:  No results for input(s): WBC, HGB, HCT, PLT in the last 72 hours. BMET No results for input(s): NA, K, CL, CO2, GLUCOSE, BUN, CREATININE, CALCIUM in the last 72 hours. PT/INR No results for input(s): LABPROT, INR in the last 72 hours. ABG No results for input(s): PHART, HCO3 in the last 72 hours.  Invalid input(s): PCO2, PO2  Studies/Results: No results found.  Anti-infectives: Anti-infectives (From admission, onward)   Start     Dose/Rate Route Frequency Ordered Stop   08/28/18 0000  amoxicillin-clavulanate (AUGMENTIN) 875-125 MG tablet     1 tablet Oral 2 times daily 08/28/18 1530 09/11/18 2359   08/26/18 0600  piperacillin-tazobactam (ZOSYN) IVPB 3.375 g     3.375 g 12.5 mL/hr over 240 Minutes Intravenous Every 8 hours 08/25/18 2141     08/25/18 2115  ceFEPIme (MAXIPIME) 2 g in sodium chloride 0.9 % 100 mL IVPB     2 g 200 mL/hr over 30 Minutes Intravenous  Once 08/25/18 2102 08/25/18 2229   08/25/18 2115  metroNIDAZOLE (FLAGYL) IVPB 500 mg     500 mg 100 mL/hr over 60 Minutes Intravenous  Once 08/25/18 2102 08/25/18 2257      Assessment/Plan: s/p * No surgery found * Advance diet  Hx of gastric banding- [place] ago in [place] as part of a study. Lap band noted to slip inferiorly on stomach on CT. Reviewed with Dr. Andrey CampanileWilson who  advised follow up as this could put her at risk for ulcerations and perforation.  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 Tx with abx  - Readmitted 6/27 with right lower quadrant intra-abdominal abscess - IR drain placement 6/29. IR drain removed 7/23.  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 Luisa Hartornett, IR  Plan:Continue current plan of care with drain and IV antibiotics. Continue soft diet and protein shakes. Mobilize. Plan to repeat prealbumin on Monday 8/17. If improving can hopefully plan for surgery next week. If not, could consider sending home for a period of time to continue working on nutrition and plan  surgery later on.   LOS: 10 days    Julie Richards 09/05/2018

## 2018-09-06 NOTE — Progress Notes (Signed)
Subjective/Chief Complaint: No complaints   Objective: Vital signs in last 24 hours: Temp:  [98 F (36.7 C)-98.5 F (36.9 C)] 98 F (36.7 C) (08/16 0803) Pulse Rate:  [65-90] 65 (08/16 0803) Resp:  [16-17] 16 (08/16 0803) BP: (118-151)/(68-93) 118/76 (08/16 0803) SpO2:  [99 %-100 %] 99 % (08/16 0803) Last BM Date: 09/03/18  Intake/Output from previous day: 08/15 0701 - 08/16 0700 In: 965 [P.O.:960] Out: 5 [Drains:5] Intake/Output this shift: No intake/output data recorded.  General appearance: alert and cooperative Resp: clear to auscultation bilaterally Cardio: regular rate and rhythm GI: soft, mild right sided tenderness  Lab Results:  No results for input(s): WBC, HGB, HCT, PLT in the last 72 hours. BMET No results for input(s): NA, K, CL, CO2, GLUCOSE, BUN, CREATININE, CALCIUM in the last 72 hours. PT/INR No results for input(s): LABPROT, INR in the last 72 hours. ABG No results for input(s): PHART, HCO3 in the last 72 hours.  Invalid input(s): PCO2, PO2  Studies/Results: No results found.  Anti-infectives: Anti-infectives (From admission, onward)   Start     Dose/Rate Route Frequency Ordered Stop   08/28/18 0000  amoxicillin-clavulanate (AUGMENTIN) 875-125 MG tablet     1 tablet Oral 2 times daily 08/28/18 1530 09/11/18 2359   08/26/18 0600  piperacillin-tazobactam (ZOSYN) IVPB 3.375 g     3.375 g 12.5 mL/hr over 240 Minutes Intravenous Every 8 hours 08/25/18 2141     08/25/18 2115  ceFEPIme (MAXIPIME) 2 g in sodium chloride 0.9 % 100 mL IVPB     2 g 200 mL/hr over 30 Minutes Intravenous  Once 08/25/18 2102 08/25/18 2229   08/25/18 2115  metroNIDAZOLE (FLAGYL) IVPB 500 mg     500 mg 100 mL/hr over 60 Minutes Intravenous  Once 08/25/18 2102 08/25/18 2257      Assessment/Plan: s/p * No surgery found * Advance diet  Hx of gastric banding- placed >20 years ago in [place] as part of a study. Lap band noted to slip inferiorly on stomach on CT. Reviewed  with Dr. Redmond Pulling who advised follow up as this could put her at risk for ulcerations and perforation.  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 Tx with abx  - Readmitted 6/27 with right lower quadrant intra-abdominal abscess - IR drain placement 6/29. IR drain removed 7/23.  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 Brantley Stage, IR  Plan:Continuecurrent plan of care with drain and IV antibiotics. Continue soft diet and protein shakes. Mobilize. Plan to repeat prealbumin on Monday 8/17. If improving can hopefully plan for surgery next week. If not, could consider sending home for a period of time to continue working on  nutrition and plan surgery later on.   LOS: 11 days    Julie Prettyaul Julie Richards 09/06/2018

## 2018-09-07 LAB — CBC
HCT: 35.9 % — ABNORMAL LOW (ref 36.0–46.0)
Hemoglobin: 11.1 g/dL — ABNORMAL LOW (ref 12.0–15.0)
MCH: 27 pg (ref 26.0–34.0)
MCHC: 30.9 g/dL (ref 30.0–36.0)
MCV: 87.3 fL (ref 80.0–100.0)
Platelets: 379 10*3/uL (ref 150–400)
RBC: 4.11 MIL/uL (ref 3.87–5.11)
RDW: 15.8 % — ABNORMAL HIGH (ref 11.5–15.5)
WBC: 9.5 10*3/uL (ref 4.0–10.5)
nRBC: 0 % (ref 0.0–0.2)

## 2018-09-07 LAB — BASIC METABOLIC PANEL
Anion gap: 8 (ref 5–15)
BUN: 16 mg/dL (ref 8–23)
CO2: 26 mmol/L (ref 22–32)
Calcium: 8.9 mg/dL (ref 8.9–10.3)
Chloride: 106 mmol/L (ref 98–111)
Creatinine, Ser: 0.91 mg/dL (ref 0.44–1.00)
GFR calc Af Amer: >60 mL/min (ref >60)
GFR calc non Af Amer: >60 mL/min (ref >60)
Glucose, Bld: 97 mg/dL (ref 70–99)
Potassium: 3.7 mmol/L (ref 3.5–5.1)
Sodium: 140 mmol/L (ref 135–145)

## 2018-09-07 LAB — PREALBUMIN: Prealbumin: 21.4 mg/dL (ref 18–38)

## 2018-09-07 NOTE — Progress Notes (Signed)
[facility]/Trauma Progress Note      Assessment/Plan Hx of gastric banding- placed >20 years ago in [place] as part of a study. Lap band noted to slip inferiorly on stomach on CT. Reviewed with Dr. Redmond Pulling who advised follow up as this could put her at risk for ulcerations and perforation.  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 Tx with abx  - Readmitted 6/27 with right lower quadrant intra-abdominal abscess - IR drain placement 6/29. IR drain removed 7/23.  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 Brantley Stage, IR  Plan:Continuedrain and IV antibiotics. Surgery planned for Wednesday. Bowel prep tomorrow.    LOS: 12 days    Subjective: CC: very mild RLQ abdominal pain at drain site  No issues overnight. Pt is anxious about surgery. Discussed her nutrition. No nausea or vomiting.   Objective: Vital signs in last 24 hours: Temp:  [98.4 F (36.9 C)] 98.4 F (36.9 C) (08/16 1924) Pulse Rate:  [74-86] 74 (08/16 1924) Resp:  [16] 16 (08/16 1717) BP: (120-131)/(71-80) 120/71 (08/16 1924) SpO2:  [99 %-100 %] 99 % (08/16 1924) Last  BM Date: 09/04/18  Intake/Output from previous day: 08/16 0701 - 08/17 0700 In: 845 [P.O.:840] Out: 5 [Drains:5] Intake/Output this shift: No intake/output data recorded.  PE: Gen: Alert, NAD, pleasant Card: RRR HEENT: pupils equal and round Pulm:CTA BL, no W/R/R, Rate andeffort normal SWF:UXNA, ND,mild TTP at drain site,IR drain in placewithscant thin/tan fluid in bag (5ccrecordedlast 24 hours) Ext:no BLE edema Psych: A&Ox3  Skin: no rashes noted, warm and dry    Anti-infectives: Anti-infectives (From admission, onward)   Start     Dose/Rate Route Frequency Ordered Stop   08/28/18 0000  amoxicillin-clavulanate (AUGMENTIN) 875-125 MG tablet     1 tablet Oral 2 times daily 08/28/18 1530 09/11/18 2359   08/26/18 0600  piperacillin-tazobactam (ZOSYN) IVPB 3.375 g     3.375 g 12.5 mL/hr over 240 Minutes Intravenous Every 8 hours 08/25/18 2141     08/25/18 2115  ceFEPIme (MAXIPIME) 2 g in sodium chloride 0.9 % 100 mL IVPB     2 g 200 mL/hr over 30 Minutes Intravenous  Once 08/25/18 2102 08/25/18 2229   08/25/18 2115  metroNIDAZOLE (FLAGYL) IVPB 500 mg     500 mg 100 mL/hr over 60 Minutes Intravenous  Once 08/25/18 2102 08/25/18 2257      Lab Results:  Recent Labs    09/07/18 0351  WBC 9.5  HGB 11.1*  HCT 35.9*  PLT 379   BMET Recent Labs    09/07/18 0351  NA 140  K 3.7  CL 106  CO2 26  GLUCOSE 97  BUN 16  CREATININE 0.91  CALCIUM  8.9   PT/INR No results for input(s): LABPROT, INR in the last 72 hours. CMP     Component Value Date/Time   NA 140 09/07/2018 0351   K 3.7 09/07/2018 0351   CL 106 09/07/2018 0351   CO2 26 09/07/2018 0351   GLUCOSE 97 09/07/2018 0351   BUN 16 09/07/2018 0351   CREATININE 0.91 09/07/2018 0351   CALCIUM 8.9 09/07/2018 0351   PROT 7.1 08/25/2018 1329   ALBUMIN 3.3 (L) 08/25/2018 1329   AST 18 08/25/2018 1329   ALT 24 08/25/2018 1329   ALKPHOS 88 08/25/2018 1329   BILITOT 1.6 (H) 08/25/2018 1329   GFRNONAA  >60 09/07/2018 0351   GFRAA >60 09/07/2018 0351   Lipase     Component Value Date/Time   LIPASE 24 08/25/2018 1329    Studies/Results: No results found.    Jerre SimonJessica L Kayshaun Polanco , [facility] 09/07/2018, 9:53 AM  Pager: [phone] Mon-Wed, Friday 7:00am-4:30pm Thurs 7am-11:30am  Consults: [phone]

## 2018-09-08 MED ORDER — ALVIMOPAN 12 MG PO CAPS: 12.0000 mg | ORAL_CAPSULE | ORAL | Status: AC

## 2018-09-08 MED ORDER — POLYETHYLENE GLYCOL 3350 17 GM/SCOOP PO POWD
1.0000 | Freq: Once | ORAL | Status: AC
  Administered 2018-09-08: 10:00:00 255 g via ORAL
  Filled 2018-09-08: qty 255

## 2018-09-08 MED ORDER — GABAPENTIN 300 MG PO CAPS
300.0000 mg | ORAL_CAPSULE | ORAL | Status: AC
  Administered 2018-09-09: 09:00:00 300 mg via ORAL
  Filled 2018-09-08: qty 1

## 2018-09-08 MED ORDER — METRONIDAZOLE 500 MG PO TABS
1000.0000 mg | ORAL_TABLET | ORAL | Status: AC
  Administered 2018-09-08 (×3): 1000 mg via ORAL
  Filled 2018-09-08 (×3): qty 2

## 2018-09-08 MED ORDER — BISACODYL 5 MG PO TBEC
20.0000 mg | DELAYED_RELEASE_TABLET | Freq: Once | ORAL | Status: AC
  Administered 2018-09-08: 20 mg via ORAL
  Filled 2018-09-08: qty 4

## 2018-09-08 MED ORDER — ACETAMINOPHEN 500 MG PO TABS
1000.0000 mg | ORAL_TABLET | ORAL | Status: AC
  Administered 2018-09-09: 09:00:00 1000 mg via ORAL
  Filled 2018-09-08: qty 2

## 2018-09-08 MED ORDER — CHLORHEXIDINE GLUCONATE CLOTH 2 % EX PADS
6.0000 | MEDICATED_PAD | Freq: Once | CUTANEOUS | Status: AC
  Administered 2018-09-09: 05:00:00 6 via TOPICAL

## 2018-09-08 MED ORDER — CHLORHEXIDINE GLUCONATE CLOTH 2 % EX PADS
6.0000 | MEDICATED_PAD | Freq: Once | CUTANEOUS | Status: AC
  Administered 2018-09-08: 6 via TOPICAL

## 2018-09-08 MED ORDER — NEOMYCIN SULFATE 500 MG PO TABS
1000.0000 mg | ORAL_TABLET | ORAL | Status: AC
  Administered 2018-09-08 (×3): 1000 mg via ORAL
  Filled 2018-09-08 (×3): qty 2

## 2018-09-08 NOTE — Plan of Care (Signed)
  Problem: Elimination: Goal: Will not experience complications related to bowel motility Outcome: Progressing   Problem: Skin Integrity: Goal: Risk for impaired skin integrity will decrease Outcome: Progressing   

## 2018-09-08 NOTE — Plan of Care (Signed)
  Problem: Elimination: Goal: Will not experience complications related to bowel motility Outcome: Progressing   Problem: Clinical Measurements: Goal: Diagnostic test results will improve Outcome: Progressing   

## 2018-09-08 NOTE — Progress Notes (Signed)
[facility] Progress Note     Subjective: CC-  Up in chair. Minimal abdominal pain. Denies n/v. BM yesterday. Nervous about surgery tomorrow.  Objective: Vital signs in last 24 hours: Temp:  [97.8 F (36.6 C)-98.7 F (37.1 C)] 97.8 F (36.6 C) (08/18 0758) Pulse Rate:  [73-86] 79 (08/18 0758) Resp:  [15-18] 15 (08/18 0438) BP: (123-151)/(69-90) 123/72 (08/18 0758) SpO2:  [93 %-100 %] 98 % (08/18 0758) Last BM Date: 09/04/18  Intake/Output from previous day: 08/17 0701 - 08/18 0700 In: 1070 [P.O.:960; IV Piggyback:100] Out: 6 [Drains:5; Stool:1] Intake/Output this shift: No intake/output data recorded.  PE: Gen: Alert, NAD, pleasant Card: RRR HEENT: pupils equal and round Pulm:CTAB, no W/R/R, Rate andeffort normal VWU:JWJXAbd:Soft, ND,mild TTP at drain site,IR drain in placewithscant thin/tan fluid in bag (5ccrecordedlast 24 hours) Ext:no BLE edema Psych: A&Ox3  Skin: no rashes noted, warm and dry  Lab Results:  Recent Labs    09/07/18 0351  WBC 9.5  HGB 11.1*  HCT 35.9*  PLT 379   BMET Recent Labs    09/07/18 0351  NA 140  K 3.7  CL 106  CO2 26  GLUCOSE 97  BUN 16  CREATININE 0.91  CALCIUM 8.9   PT/INR No results for input(s): LABPROT, INR in the last 72 hours. CMP     Component Value Date/Time   NA 140 09/07/2018 0351   K 3.7 09/07/2018 0351   CL 106 09/07/2018 0351   CO2 26 09/07/2018 0351   GLUCOSE 97 09/07/2018 0351   BUN 16 09/07/2018 0351   CREATININE 0.91 09/07/2018 0351   CALCIUM 8.9 09/07/2018 0351   PROT 7.1 08/25/2018 1329   ALBUMIN 3.3 (L) 08/25/2018 1329   AST 18 08/25/2018 1329   ALT 24 08/25/2018 1329   ALKPHOS 88 08/25/2018 1329   BILITOT 1.6 (H) 08/25/2018 1329   GFRNONAA >60 09/07/2018 0351   GFRAA >60 09/07/2018 0351   Lipase     Component Value Date/Time   LIPASE 24 08/25/2018 1329       Studies/Results: No results found.  Anti-infectives: Anti-infectives (From admission, onward)   Start     Dose/Rate Route Frequency Ordered Stop   09/08/18 1400  neomycin (MYCIFRADIN) tablet 1,000 mg     1,000 mg Oral 3 times per day 09/08/18 0812 09/09/18 1359   09/08/18 1400  metroNIDAZOLE (FLAGYL) tablet 1,000 mg     1,000 mg Oral 3 times per day 09/08/18 0812 09/09/18 1359   08/28/18 0000  amoxicillin-clavulanate (AUGMENTIN) 875-125 MG tablet     1 tablet Oral 2 times daily 08/28/18 1530 09/11/18 2359   08/26/18 0600  piperacillin-tazobactam (ZOSYN) IVPB 3.375 g     3.375 g 12.5 mL/hr over 240 Minutes Intravenous Every 8 hours 08/25/18 2141     08/25/18 2115  ceFEPIme (MAXIPIME) 2 g in sodium chloride 0.9 % 100 mL IVPB     2 g 200 mL/hr over 30 Minutes Intravenous  Once 08/25/18 2102 08/25/18 2229   08/25/18 2115  metroNIDAZOLE (FLAGYL) IVPB 500 mg     500 mg 100 mL/hr over 60 Minutes Intravenous  Once 08/25/18 2102 08/25/18 2257       Assessment/Plan Hx of gastric banding- placed >20 years ago in [place] as part of a study. Lap band noted to slip inferiorly on stomach on CT. Reviewed with Dr. Andrey CampanileWilson who advised follow up as this could put her at risk for ulcerations and perforation.  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 Tx with abx  - Readmitted 6/27 with right lower quadrant intra-abdominal abscess - IR drain placement 6/29. IR drain removed 7/23.  - CT 8/4 recurrent right lower quadrant abscess measuring5.4x 4.5 x 5.3 cm - IR replaced drain 8/5, now with minimal output and CT scan shows this abscess has resolved >> culture growing E coli, pip/tazo sensitive but ampicillin/sulbactam intermediate - CT 8/10 shows that the initial abscess that was drained has resolved, but there is persistent inflammatory changes in the RLQ and a new fluid  collection5.7 x 1.5 x 3.4 cmalong the anterolateral peritoneal cavity abutting the anterior margin of the cecum - drain injection study 8/12 shows fistula to cecum  FEN -IVF,CLD, NPO after MN VTE -SCDs, lovenox ID -Zosyn 8/5>> Foley - none Follow up - Dr. Brantley Stage, IR  Plan:Bowel prep ordered for today. Clear liquids today and NPO after midnight. Continuedrain and IV antibiotics. OR tomorrow for laparoscopic assisted possible open ileocecectomy.   LOS: 13 days    [name] , [facility] 09/08/2018, 8:32 AM Pager: [phone] Mon-Thurs 7:00 am-4:30 pm Fri 7:00 am -11:30 AM Sat-Sun 7:00 am-11:30 am

## 2018-09-08 NOTE — Progress Notes (Signed)
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 Authorizing Provider  Cholecalciferol (VITAMIN D3 PO) Take 1 capsule by mouth daily.    Yes [provider]  Melatonin 5 MG TABS Take 5 mg by mouth every evening. 07/27/18  Yes [provider]  Multiple Vitamin (MULTIVITAMIN WITH MINERALS) TABS tablet Take 1 tablet by mouth daily.   Yes [provider]  OVER THE COUNTER MEDICATION Take 1 capsule by mouth daily. Mega Red   Yes [provider]  Potassium 99 MG TABS Take 1 tablet by mouth daily.   Yes [provider]  acetaminophen (TYLENOL) 325 MG tablet Take 2 tablets (650 mg total) by mouth every 6 (six) hours as needed for mild pain. Patient not taking: Reported on 08/25/2018 07/16/18   Rayburn, Tresa EndoKelly A, PA-C  amoxicillin-clavulanate (AUGMENTIN) 875-125 MG tablet Take 1 tablet by mouth 2 (two) times daily for 14 days. 08/28/18 09/11/18  Barnetta Chapelsborne, Kelly, PA-C  ibuprofen (ADVIL) 400 MG tablet Take 1 tablet (400 mg total) by mouth every 6 (six) hours as needed for moderate pain. Patient not taking: Reported on 08/25/2018 07/16/18   Rayburn, Tresa EndoKelly A, PA-C  ondansetron (ZOFRAN-ODT) 4 MG disintegrating tablet Take 1 tablet (4 mg total) by mouth every 6 (six) hours as needed for nausea. Patient not taking: Reported on 08/25/2018 07/27/18   Jerre SimonFocht, Jessica L, PA  traMADol (ULTRAM) 50 MG tablet Take 1-2 tablets (50-100 mg total) by mouth every 6 (six) hours as needed for moderate pain  or severe pain. Patient not taking: Reported on 08/25/2018 07/16/18   Rayburn, Tresa EndoKelly A, PA-C  traMADol (ULTRAM) 50 MG tablet Take 1 tablet (50 mg total) by mouth every 6 (six) hours as needed for moderate pain or severe pain. 08/28/18   Barnetta Chapelsborne, Kelly, PA-C     Vital Signs: BP 123/72 (BP Location: Left Arm)   Pulse 79   Temp 97.8 F (36.6 C) (Oral)   Resp 15   Ht 5\' 9"  (1.753 m)   Wt 297 lb 9.9 oz (135 kg)   SpO2 98%   BMI 43.95 kg/m   Physical Exam Vitals signs reviewed.  Skin:    General: Skin is warm and dry.     Comments: Skin site is clean and dry No sign of infection Drain intact OP blood tinged: scant for few days    Neurological:     Mental Status: She is alert.     Imaging: No results found.  Labs:  CBC: Recent Labs    08/30/18 0506 09/01/18 1317 09/02/18 0445 09/07/18 0351  WBC 8.4 10.3 8.9 9.5  HGB 10.6* 11.2* 11.1* 11.1*  HCT 32.8* 35.2* 35.0* 35.9*  PLT 235 311 314 379    COAGS: Recent Labs    07/19/18 0818 08/26/18 0200  INR 1.2 1.4*  APTT  --  31    BMP: Recent Labs    08/25/18 1329 08/26/18 0200 09/01/18 1317 09/07/18 0351  NA 138 136 141 140  K 4.1 3.7 3.3* 3.7  CL 103 102 104 106  CO2 26 24 25 26   GLUCOSE 119* 121* 101* 97  BUN 12 12 5* 16  CALCIUM 9.1 8.7* 9.0 8.9  CREATININE 0.89 0.79 0.76 0.91  GFRNONAA >60 >60 >60 >60  GFRAA >60 >60 >60 >60    LIVER FUNCTION TESTS: Recent Labs    07/21/18 0818 07/22/18 0156 07/23/18 0530 08/25/18 1329  BILITOT 1.1 0.7 0.4 1.6*  AST 12* 12* 15 18  ALT 19 15 15 24   ALKPHOS 54 56 55 88  PROT 6.2* 6.3* 6.7 7.1  ALBUMIN 2.1* 2.2* 2.2* 3.3*    Assessment and Plan:  For OR tomorrow with Dr Brantley Stage Plan per [facility]  Electronically Signed: Lavonia Drafts, PA-C 09/08/2018, 3:13 PM   I spent a total of 15 Minutes at the the patient's bedside AND on the patient's hospital floor or unit, greater than 50% of which was counseling/coordinating care for Appendiceal cutaneous fistula-  drain

## 2018-09-08 NOTE — Anesthesia Preprocedure Evaluation (Addendum)
Anesthesia Evaluation  Patient identified by MRN, date of birth, ID band Patient awake    Reviewed: Allergy & Precautions, NPO status , Patient's Chart, lab work & pertinent test results  History of Anesthesia Complications Negative for: history of anesthetic complications  Airway Mallampati: II  TM Distance: >3 FB Neck ROM: Full    Dental  (+) Missing,    Pulmonary neg pulmonary ROS,    Pulmonary exam normal        Cardiovascular negative cardio ROS Normal cardiovascular exam     Neuro/Psych negative neurological ROS     GI/Hepatic Neg liver ROS, Hx of perforated appendicitis   Endo/Other  Morbid obesity  Renal/GU negative Renal ROS     Musculoskeletal negative musculoskeletal ROS (+)   Abdominal   Peds  Hematology  (+) anemia , Hgb 11.1   Anesthesia Other Findings Day of surgery medications reviewed with the patient.  Reproductive/Obstetrics                            Anesthesia Physical Anesthesia Plan  ASA: III  Anesthesia Plan: General   Post-op Pain Management:    Induction: Intravenous  PONV Risk Score and Plan: 4 or greater and Treatment may vary due to age or medical condition, Ondansetron, Dexamethasone and Midazolam  Airway Management Planned: Oral ETT  Additional Equipment: None  Intra-op Plan:   Post-operative Plan: Extubation in OR  Informed Consent: I have reviewed the patients History and Physical, chart, labs and discussed the procedure including the risks, benefits and alternatives for the proposed anesthesia with the patient or authorized representative who has indicated his/her understanding and acceptance.     Dental advisory given  Plan Discussed with: CRNA  Anesthesia Plan Comments:        Anesthesia Quick Evaluation

## 2018-09-09 ENCOUNTER — Encounter (HOSPITAL_COMMUNITY): Admission: EM | Disposition: A | Discharge status: Skilled Nursing Facility | Payer: Self-pay | Source: Home / Self Care

## 2018-09-09 ENCOUNTER — Inpatient Hospital Stay (HOSPITAL_COMMUNITY): Payer: Medicare Other | Admitting: Certified Registered Nurse Anesthetist

## 2018-09-09 ENCOUNTER — Encounter (HOSPITAL_COMMUNITY): Payer: Self-pay | Admitting: Orthopedic Surgery

## 2018-09-09 HISTORY — PX: LAPAROSCOPIC ILEOCECECTOMY: SHX5898

## 2018-09-09 SURGERY — EXCISION, CECUM WITH ILEUM, LAPAROSCOPIC
Anesthesia: General | Site: Abdomen

## 2018-09-09 MED ORDER — LACTATED RINGERS IV SOLN: INTRAVENOUS | Status: DC

## 2018-09-09 MED ORDER — FENTANYL CITRATE (PF) 250 MCG/5ML IJ SOLN
INTRAMUSCULAR | Status: AC
  Filled 2018-09-09: qty 5

## 2018-09-09 MED ORDER — HYDROMORPHONE HCL 1 MG/ML IJ SOLN
INTRAMUSCULAR | Status: AC
  Filled 2018-09-09: qty 0.5

## 2018-09-09 MED ORDER — PHENYLEPHRINE 40 MCG/ML (10ML) SYRINGE FOR IV PUSH (FOR BLOOD PRESSURE SUPPORT)
PREFILLED_SYRINGE | INTRAVENOUS | Status: AC
  Filled 2018-09-09: qty 10

## 2018-09-09 MED ORDER — ROCURONIUM BROMIDE 10 MG/ML (PF) SYRINGE
PREFILLED_SYRINGE | INTRAVENOUS | Status: DC | PRN
  Administered 2018-09-09: 10 mg via INTRAVENOUS
  Administered 2018-09-09: 70 mg via INTRAVENOUS
  Administered 2018-09-09: 10 mg via INTRAVENOUS

## 2018-09-09 MED ORDER — HYDROMORPHONE 1 MG/ML IV SOLN
INTRAVENOUS | Status: AC
  Administered 2018-09-09: 30 mg via INTRAVENOUS
  Filled 2018-09-09: qty 30

## 2018-09-09 MED ORDER — METHOCARBAMOL 1000 MG/10ML IJ SOLN
500.0000 mg | Freq: Three times a day (TID) | INTRAVENOUS | Status: DC
  Administered 2018-09-09 – 2018-09-10 (×5): 500 mg via INTRAVENOUS
  Filled 2018-09-09 (×4): qty 5
  Filled 2018-09-09 (×2): qty 500
  Filled 2018-09-09 (×2): qty 5

## 2018-09-09 MED ORDER — DEXAMETHASONE SODIUM PHOSPHATE 10 MG/ML IJ SOLN
INTRAMUSCULAR | Status: DC | PRN
  Administered 2018-09-09: 10 mg via INTRAVENOUS

## 2018-09-09 MED ORDER — ONDANSETRON HCL 4 MG/2ML IJ SOLN
INTRAMUSCULAR | Status: AC
  Filled 2018-09-09: qty 2

## 2018-09-09 MED ORDER — SODIUM CHLORIDE 0.9 % IR SOLN
Status: DC | PRN
  Administered 2018-09-09: 1000 mL

## 2018-09-09 MED ORDER — PROPOFOL 10 MG/ML IV BOLUS
INTRAVENOUS | Status: AC
  Filled 2018-09-09: qty 20

## 2018-09-09 MED ORDER — EPHEDRINE 5 MG/ML INJ
INTRAVENOUS | Status: AC
  Filled 2018-09-09: qty 10

## 2018-09-09 MED ORDER — ACETAMINOPHEN 10 MG/ML IV SOLN
1000.0000 mg | Freq: Four times a day (QID) | INTRAVENOUS | Status: AC
  Administered 2018-09-09 – 2018-09-10 (×4): 1000 mg via INTRAVENOUS
  Filled 2018-09-09 (×4): qty 100

## 2018-09-09 MED ORDER — EPHEDRINE SULFATE-NACL 50-0.9 MG/10ML-% IV SOSY
PREFILLED_SYRINGE | INTRAVENOUS | Status: DC | PRN
  Administered 2018-09-09: 5 mg via INTRAVENOUS

## 2018-09-09 MED ORDER — ALVIMOPAN 12 MG PO CAPS
12.0000 mg | ORAL_CAPSULE | ORAL | Status: AC
  Administered 2018-09-09: 10:00:00 12 mg via ORAL

## 2018-09-09 MED ORDER — FENTANYL CITRATE (PF) 100 MCG/2ML IJ SOLN
INTRAMUSCULAR | Status: DC | PRN
  Administered 2018-09-09: 100 ug via INTRAVENOUS
  Administered 2018-09-09 (×3): 50 ug via INTRAVENOUS

## 2018-09-09 MED ORDER — SUGAMMADEX SODIUM 500 MG/5ML IV SOLN
INTRAVENOUS | Status: DC | PRN
  Administered 2018-09-09: 300 mg via INTRAVENOUS

## 2018-09-09 MED ORDER — HYDROMORPHONE HCL 1 MG/ML IJ SOLN
0.2500 mg | INTRAMUSCULAR | Status: DC | PRN
  Administered 2018-09-09 (×4): 0.5 mg via INTRAVENOUS

## 2018-09-09 MED ORDER — SUGAMMADEX SODIUM 500 MG/5ML IV SOLN
INTRAVENOUS | Status: AC
  Filled 2018-09-09: qty 5

## 2018-09-09 MED ORDER — LIDOCAINE 2% (20 MG/ML) 5 ML SYRINGE
INTRAMUSCULAR | Status: AC
  Filled 2018-09-09: qty 5

## 2018-09-09 MED ORDER — MIDAZOLAM HCL 5 MG/5ML IJ SOLN
INTRAMUSCULAR | Status: DC | PRN
  Administered 2018-09-09 (×2): 1 mg via INTRAVENOUS

## 2018-09-09 MED ORDER — BUPIVACAINE HCL (PF) 0.25 % IJ SOLN
INTRAMUSCULAR | Status: AC
  Filled 2018-09-09: qty 30

## 2018-09-09 MED ORDER — MIDAZOLAM HCL 2 MG/2ML IJ SOLN
INTRAMUSCULAR | Status: AC
  Filled 2018-09-09: qty 2

## 2018-09-09 MED ORDER — 0.9 % SODIUM CHLORIDE (POUR BTL) OPTIME
TOPICAL | Status: DC | PRN
  Administered 2018-09-09 (×4): 1000 mL

## 2018-09-09 MED ORDER — PROPOFOL 10 MG/ML IV BOLUS
INTRAVENOUS | Status: DC | PRN
  Administered 2018-09-09: 200 mg via INTRAVENOUS

## 2018-09-09 MED ORDER — ACETAMINOPHEN 10 MG/ML IV SOLN
INTRAVENOUS | Status: AC
  Filled 2018-09-09: qty 100

## 2018-09-09 MED ORDER — HYDROMORPHONE 1 MG/ML IV SOLN
INTRAVENOUS | Status: DC
  Administered 2018-09-09: 14:00:00 30 mg via INTRAVENOUS
  Administered 2018-09-10 – 2018-09-11 (×2): 0.3 mg via INTRAVENOUS
  Administered 2018-09-11: 0 mg via INTRAVENOUS
  Administered 2018-09-11: 0.3 mg via INTRAVENOUS
  Administered 2018-09-12 (×2): 0 mg via INTRAVENOUS
  Administered 2018-09-12: 0.3 mg via INTRAVENOUS

## 2018-09-09 MED ORDER — HYDROMORPHONE HCL 1 MG/ML IJ SOLN
INTRAMUSCULAR | Status: DC | PRN
  Administered 2018-09-09: .25 mg via INTRAVENOUS
  Administered 2018-09-09: 0.5 mg via INTRAVENOUS
  Administered 2018-09-09: .25 mg via INTRAVENOUS

## 2018-09-09 MED ORDER — LACTATED RINGERS IV SOLN
INTRAVENOUS | Status: DC | PRN
  Administered 2018-09-09: 12:00:00 via INTRAVENOUS

## 2018-09-09 MED ORDER — LIDOCAINE 2% (20 MG/ML) 5 ML SYRINGE
INTRAMUSCULAR | Status: DC | PRN
  Administered 2018-09-09: 100 mg via INTRAVENOUS

## 2018-09-09 MED ORDER — BUPIVACAINE-EPINEPHRINE 0.25% -1:200000 IJ SOLN
INTRAMUSCULAR | Status: DC | PRN
  Administered 2018-09-09: 8 mL

## 2018-09-09 MED ORDER — HYDROMORPHONE HCL 1 MG/ML IJ SOLN
INTRAMUSCULAR | Status: AC
  Filled 2018-09-09: qty 2

## 2018-09-09 MED ORDER — SODIUM CHLORIDE 0.9 % IV SOLN
INTRAVENOUS | Status: DC
  Administered 2018-09-09 – 2018-09-16 (×7): via INTRAVENOUS

## 2018-09-09 MED ORDER — ONDANSETRON HCL 4 MG/2ML IJ SOLN
INTRAMUSCULAR | Status: DC | PRN
  Administered 2018-09-09 (×2): 4 mg via INTRAVENOUS

## 2018-09-09 MED ORDER — ACETAMINOPHEN 10 MG/ML IV SOLN
1000.0000 mg | Freq: Once | INTRAVENOUS | Status: DC | PRN
  Administered 2018-09-09: 15:00:00 1000 mg via INTRAVENOUS

## 2018-09-09 MED ORDER — PROMETHAZINE HCL 25 MG/ML IJ SOLN: 6.2500 mg | INTRAMUSCULAR | Status: DC | PRN

## 2018-09-09 MED ORDER — DEXAMETHASONE SODIUM PHOSPHATE 10 MG/ML IJ SOLN
INTRAMUSCULAR | Status: AC
  Filled 2018-09-09: qty 1

## 2018-09-09 MED ORDER — ALVIMOPAN 12 MG PO CAPS
ORAL_CAPSULE | ORAL | Status: AC
  Administered 2018-09-09: 12 mg via ORAL
  Filled 2018-09-09: qty 1

## 2018-09-09 MED ORDER — STERILE WATER FOR IRRIGATION IR SOLN
Status: DC | PRN
  Administered 2018-09-09: 1000 mL

## 2018-09-09 MED ORDER — SODIUM CHLORIDE 0.9% FLUSH: 9.0000 mL | INTRAVENOUS | Status: DC | PRN

## 2018-09-09 MED ORDER — PHENYLEPHRINE 40 MCG/ML (10ML) SYRINGE FOR IV PUSH (FOR BLOOD PRESSURE SUPPORT)
PREFILLED_SYRINGE | INTRAVENOUS | Status: DC | PRN
  Administered 2018-09-09 (×5): 80 ug via INTRAVENOUS

## 2018-09-09 MED ORDER — NALOXONE HCL 0.4 MG/ML IJ SOLN: 0.4000 mg | INTRAMUSCULAR | Status: DC | PRN

## 2018-09-09 SURGICAL SUPPLY — 62 items
BNDG GAUZE ELAST 4 BULKY (GAUZE/BANDAGES/DRESSINGS) ×2 IMPLANT
CANISTER SUCT 3000ML PPV (MISCELLANEOUS) ×4 IMPLANT
CHLORAPREP W/TINT 26 (MISCELLANEOUS) ×2 IMPLANT
COVER MAYO STAND STRL (DRAPES) ×2 IMPLANT
COVER SURGICAL LIGHT HANDLE (MISCELLANEOUS) ×4 IMPLANT
DRAPE UTILITY XL STRL (DRAPES) ×2 IMPLANT
DRSG PAD ABDOMINAL 8X10 ST (GAUZE/BANDAGES/DRESSINGS) ×2 IMPLANT
ELECT REM PT RETURN 9FT ADLT (ELECTROSURGICAL) ×2
ELECTRODE REM PT RTRN 9FT ADLT (ELECTROSURGICAL) ×1 IMPLANT
GAUZE 4X4 16PLY RFD (DISPOSABLE) ×2 IMPLANT
GAUZE SPONGE 2X2 8PLY STRL LF (GAUZE/BANDAGES/DRESSINGS) ×1 IMPLANT
GLOVE BIO SURGEON STRL SZ 6.5 (GLOVE) ×4 IMPLANT
GLOVE BIO SURGEON STRL SZ8 (GLOVE) ×4 IMPLANT
GLOVE BIOGEL PI IND STRL 6.5 (GLOVE) ×7 IMPLANT
GLOVE BIOGEL PI IND STRL 7.0 (GLOVE) ×2 IMPLANT
GLOVE BIOGEL PI IND STRL 8 (GLOVE) ×2 IMPLANT
GLOVE BIOGEL PI INDICATOR 6.5 (GLOVE) ×7
GLOVE BIOGEL PI INDICATOR 7.0 (GLOVE) ×2
GLOVE BIOGEL PI INDICATOR 8 (GLOVE) ×2
GLOVE SURG SS PI 6.0 STRL IVOR (GLOVE) ×6 IMPLANT
GOWN STRL REUS W/ TWL LRG LVL3 (GOWN DISPOSABLE) ×6 IMPLANT
GOWN STRL REUS W/ TWL XL LVL3 (GOWN DISPOSABLE) ×2 IMPLANT
GOWN STRL REUS W/TWL LRG LVL3 (GOWN DISPOSABLE) ×6
GOWN STRL REUS W/TWL XL LVL3 (GOWN DISPOSABLE) ×2
HANDLE SUCTION POOLE (INSTRUMENTS) ×1 IMPLANT
KIT BASIN OR (CUSTOM PROCEDURE TRAY) ×2 IMPLANT
KIT TURNOVER KIT B (KITS) ×2 IMPLANT
LIGASURE IMPACT 36 18CM CVD LR (INSTRUMENTS) ×2 IMPLANT
NS IRRIG 1000ML POUR BTL (IV SOLUTION) ×4 IMPLANT
PACK COLON (CUSTOM PROCEDURE TRAY) ×2 IMPLANT
PAD ARMBOARD 7.5X6 YLW CONV (MISCELLANEOUS) ×4 IMPLANT
PENCIL SMOKE EVACUATOR (MISCELLANEOUS) ×2 IMPLANT
RELOAD PROXIMATE 75MM BLUE (ENDOMECHANICALS) ×6 IMPLANT
SCISSORS LAP 5X35 DISP (ENDOMECHANICALS) ×2 IMPLANT
SET IRRIG TUBING LAPAROSCOPIC (IRRIGATION / IRRIGATOR) ×2 IMPLANT
SET TUBE SMOKE EVAC HIGH FLOW (TUBING) ×2 IMPLANT
SHEARS HARMONIC ACE PLUS 36CM (ENDOMECHANICALS) ×2 IMPLANT
SLEEVE ENDOPATH XCEL 5M (ENDOMECHANICALS) ×4 IMPLANT
SLEEVE SUCTION CATH 165 (SLEEVE) ×2 IMPLANT
SPECIMEN JAR LARGE (MISCELLANEOUS) ×2 IMPLANT
SPONGE GAUZE 2X2 STER 10/PKG (GAUZE/BANDAGES/DRESSINGS) ×1
SPONGE LAP 18X18 RF (DISPOSABLE) ×4 IMPLANT
STAPLER GUN LINEAR PROX 60 (STAPLE) ×2 IMPLANT
STAPLER PROXIMATE 75MM BLUE (STAPLE) ×2 IMPLANT
STAPLER VISISTAT 35W (STAPLE) ×2 IMPLANT
SUCTION POOLE HANDLE (INSTRUMENTS) ×2
SUT PDS AB 1 TP1 54 (SUTURE) ×4 IMPLANT
SUT VIC AB 2-0 SH 18 (SUTURE) ×2 IMPLANT
SUT VIC AB 3-0 SH 18 (SUTURE) ×2 IMPLANT
SUT VICRYL 0 UR6 27IN ABS (SUTURE) ×2 IMPLANT
SUT VICRYL AB 2 0 TIES (SUTURE) ×2 IMPLANT
SUT VICRYL AB 3 0 TIES (SUTURE) ×2 IMPLANT
SYS LAPSCP GELPORT 120MM (MISCELLANEOUS) ×2
SYSTEM LAPSCP GELPORT 120MM (MISCELLANEOUS) ×1 IMPLANT
TOWEL GREEN STERILE (TOWEL DISPOSABLE) ×4 IMPLANT
TRAY FOLEY MTR SLVR 16FR STAT (SET/KITS/TRAYS/PACK) ×2 IMPLANT
TROCAR XCEL BLUNT TIP 100MML (ENDOMECHANICALS) ×2 IMPLANT
TROCAR XCEL NON-BLD 11X100MML (ENDOMECHANICALS) ×2 IMPLANT
TROCAR XCEL NON-BLD 5MMX100MML (ENDOMECHANICALS) ×2 IMPLANT
TUBE CONNECTING 12X1/4 (SUCTIONS) ×4 IMPLANT
WATER STERILE IRR 1000ML POUR (IV SOLUTION) ×2 IMPLANT
YANKAUER SUCT BULB TIP NO VENT (SUCTIONS) ×4 IMPLANT

## 2018-09-09 NOTE — Op Note (Signed)
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  She was discharged home after that but returned with an abscess and underwent percutaneous drainage.  She slowly improved after that and drain was removed and she was in good health for about 3 weeks.  She was seen in the office and opted for observation over appendectomy.  4 days later she developed fever and a large abscess had reaccumulated.  She was readmitted and drainage was performed.  Study of the sinus tract showed continued communication with the cecum consistent with appendiceal cutaneous fistula.  We discussed surgical options.  We discussed the pros and cons of surgery versus continued drainage.  The likelihood of healing was low therefore surgery recommended.  We discussed laparoscopic and open techniques as well as expected postoperative recovery.  The procedure was discussed with the patient.  Laparoscopic partial colectomy discussed with the patient as well as non operative treatments. The risks of operative management include bleeding,  Infection,  Leak of anastamosis,  Ostomy formation, open procedure,  Sepsis,  Abcess,  Hernia,  DVT,  Pulmonary complications,  Cardiovascular  complications,  Injury to ureter,  Bladder,kidney,and anesthesia risks,  And death. The patient understands.  Questions answered.   The success of the procedure is 50-85 % for treating the patients symptoms. They agree to proceed.  Description of procedure: The  patient was met in the holding area and the review of the procedures performed.  She agreed to proceed.  She taken back the operating.  She is placed supine upon the operating table.  After induction of general esthesia Foley catheter was placed under sterile conditions both arms were tucked and she was prepped and draped in sterile fashion.  She was already antibiotics.  Timeout was done.  In the right upper quadrant a 5 mm incision was made for a 5 mm port.  Optiview was used for direct insertion to the abdominal cavity.  This was done under direct vision to the abdominal wall layers without injury.  Pneumoperitoneum was then created to 15 mmHg CO2 pressure.  I placed 2 additional ports in the left mid abdomen left lower quadrant.  The percutaneous drain was in place.  The cecum was tethered up against the anterior abdominal wall.  I carefully dissect this off the anterior abdominal wall with harmonic scalpel.  I then removed the drain.  This then fell back into the middle cavity.  We then began to mobilize the cecum along the white line of Toldt with harmonic scalpel.  This was densely adherent to the terminal ileum as well as a sigmoid colon.  After I mobilized the cecum HandPort was placed through a 10 cm midline incision.  The HandPort was used to help mobilize the cecum.  We did this but mesentery was extremely foreshortened and very difficult.  This would not pull through the HandPort easily.  I opted to go ahead remove the laparoscope instruments and extend my incision both superiorly and inferiorly.  This was done for 4 cm direction.  There is some anterior abdominal adhesions from previous abdominal surgery taken  down the were attached to the transverse colon.  I then placed a retractor.  I then was able to mobilize the colon further and divide the terminal ileum as it entered the cecum.  The sigmoid colon had walled off the abscess which was down in the mesentery and I divided the mesentery using the  harmonic scalpel to visualize this.  I was able to dissect the sigmoid colon away from the phlegmon.  It was carefully examined there is no injuries to it.  I then divided the cecum just distal to the area of inflammation.  The TTI was divided as well about 10 cm from the ileocecal valve.  This area of inflammation I was then resected using the LigaSure to take down the mesentery.  Both the small bowel and residual colon was soft and were amendable to anastomosis.  This was created using a GIA 87 stapler for a side-to-side functional end-to-end anastomosis of the terminal ileum to the ascending colon.  A TA 60 was used to close the common enterotomy.  The anastomosis was widely patent, under no tension and had no signs of bleeding.  The mesenteric defect was closed with 2-0 Vicryl.  Irrigation of the right upper quadrant was done.  This was clear.  Reexamine the small intestine.  There were some loose adhesions from previous surgery but no signs obstruction therefore these were left in place.  The colon was identified and carefully examined.  There was residual abscess cavity in the mesentery of the sigmoid colon I removed this.  Otherwise no signs of fistula or other bowel injury.  These bowel was replaced back in the abdominal cavity.  Copious amounts of irrigation was used.  We then closed the fascia with #1 PDS.  Skin packed open.  Port sites closed with staples.  Dry dressings applied.  All final counts were found to be correct.  EBL 200 cc.  The patient was awoke extubated taken recovery in satisfactory condition.

## 2018-09-09 NOTE — Progress Notes (Signed)
Subjective/Chief Complaint: none In good spirits  Discussed colon resection and procedure with her today  Questions answered    Objective: Vital signs in last 24 hours: Temp:  [97.8 F (36.6 C)-98.5 F (36.9 C)] 97.8 F (36.6 C) (08/19 0805) Pulse Rate:  [67-86] 86 (08/19 0805) Resp:  [17-18] 18 (08/19 0805) BP: (120-138)/(68-83) 138/77 (08/19 0805) SpO2:  [97 %-100 %] 99 % (08/19 0805) Last BM Date: 09/08/18  Intake/Output from previous day: 08/18 0701 - 08/19 0700 In: 3203.8 [P.O.:2600; I.V.:399.8; IV Piggyback:199] Out: 5 [Drains:5] Intake/Output this shift: No intake/output data recorded.  General appearance: alert and cooperative Cardio: regular rate and rhythm, S1, S2 normal, no murmur, click, rub or gallop GI: drain in place   Lab Results:  Recent Labs    09/07/18 0351  WBC 9.5  HGB 11.1*  HCT 35.9*  PLT 379   BMET Recent Labs    09/07/18 0351  NA 140  K 3.7  CL 106  CO2 26  GLUCOSE 97  BUN 16  CREATININE 0.91  CALCIUM 8.9   PT/INR No results for input(s): LABPROT, INR in the last 72 hours. ABG No results for input(s): PHART, HCO3 in the last 72 hours.  Invalid input(s): PCO2, PO2  Studies/Results: No results found.  Anti-infectives: Anti-infectives (From admission, onward)   Start     Dose/Rate Route Frequency Ordered Stop   09/08/18 1400  neomycin (MYCIFRADIN) tablet 1,000 mg     1,000 mg Oral 3 times per day 09/08/18 0812 09/08/18 2145   09/08/18 1400  metroNIDAZOLE (FLAGYL) tablet 1,000 mg     1,000 mg Oral 3 times per day 09/08/18 0812 09/08/18 2145   08/28/18 0000  amoxicillin-clavulanate (AUGMENTIN) 875-125 MG tablet     1 tablet Oral 2 times daily 08/28/18 1530 09/11/18 2359   08/26/18 0600  piperacillin-tazobactam (ZOSYN) IVPB 3.375 g     3.375 g 12.5 mL/hr over 240 Minutes Intravenous Every 8 hours 08/25/18 2141     08/25/18 2115  ceFEPIme (MAXIPIME) 2 g in sodium chloride 0.9 % 100 mL IVPB     2 g 200 mL/hr over 30  Minutes Intravenous  Once 08/25/18 2102 08/25/18 2229   08/25/18 2115  metroNIDAZOLE (FLAGYL) IVPB 500 mg     500 mg 100 mL/hr over 60 Minutes Intravenous  Once 08/25/18 2102 08/25/18 2257      Assessment/Plan:  Hx of gastric banding- placed >20 years ago in [place] as part of a study. Lap band noted to slip inferiorly on stomach on CT. Reviewed with Dr. Andrey CampanileWilson who advised follow up as this could put her at risk for ulcerations and perforation.  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 Tx with abx  - Readmitted 6/27 with right lower quadrant intra-abdominal abscess - IR drain placement 6/29. IR drain removed 7/23.  - CT 8/4 recurrent right lower quadrant abscess measuring5.4x 4.5 x 5.3 cm - IR replaced drain 8/5, now with minimal output and CT scan shows this abscess has resolved >> culture growing E coli, pip/tazo sensitive but ampicillin/sulbactam intermediate - CT 8/10 shows that the initial abscess that was drained has resolved, but there is persistent inflammatory changes in the RLQ and a new fluid collection5.7 x 1.5 x 3.4 cmalong the anterolateral peritoneal cavity abutting the anterior margin of the  cecum - drain injection study 8/12 shows fistula to cecum  FEN -IVF,CLD, NPO after MN VTE -SCDs, lovenox ID -Zosyn 8/5>> Foley - none Follow up - Dr. Brantley Stage, IR  Plan:OR TODAY  The procedure was discussed with the patient.  Laparoscopic partial colectomy discussed with the patient as well as non operative treatments. The risks of operative management include bleeding,  Infection,  Leak of anastamosis,  Ostomy formation, open procedure,  Sepsis,  Abcess,  Hernia,  DVT,  Pulmonary complications,  Cardiovascular  complications,  Injury to ureter,   Bladder,kidney,and anesthesia risks,  And death. The patient understands.  Questions answered.   The success of the procedure is 50-85 % for treating the patients symptoms. They agree to proceed..   LOS: 14 days    Julie Richards 09/09/2018

## 2018-09-09 NOTE — Progress Notes (Signed)
Order for Q shift dressing changes. RN spoke with on call MD, okay to start dressing changes in the AM.

## 2018-09-09 NOTE — Anesthesia Postprocedure Evaluation (Signed)
Anesthesia Post Note  Patient: Julie Richards  Procedure(s) Performed: LAPAROSCOPIC ASSISTED ILEOCECECTOMY (N/A Abdomen)     Patient location during evaluation: PACU Anesthesia Type: General Level of consciousness: awake and alert and oriented Pain management: pain level controlled Vital Signs Assessment: post-procedure vital signs reviewed and stable Respiratory status: spontaneous breathing, nonlabored ventilation and respiratory function stable Cardiovascular status: blood pressure returned to baseline Postop Assessment: no apparent nausea or vomiting Anesthetic complications: no    Last Vitals:  Vitals:   09/09/18 1350 09/09/18 1405  BP: 104/66 107/79  Pulse: 77 67  Resp: 18 16  Temp:    SpO2: 98% 99%    Last Pain:  Vitals:   09/09/18 1406  TempSrc:   PainSc: [place]

## 2018-09-09 NOTE — Transfer of Care (Signed)
Immediate Anesthesia Transfer of Care Note  Patient: Julie Richards  Procedure(s) Performed: LAPAROSCOPIC ASSISTED ILEOCECECTOMY (N/A Abdomen)  Patient Location: PACU  Anesthesia Type:General  Level of Consciousness: awake and drowsy  Airway & Oxygen Therapy: Patient Spontanous Breathing and Patient connected to face mask oxygen  Post-op Assessment: Report given to RN and Post -op Vital signs reviewed and stable  Post vital signs: Reviewed  Last Vitals:  Vitals Value Taken Time  BP 102/63 09/09/18 1337  Temp    Pulse 77 09/09/18 1342  Resp 11 09/09/18 1342  SpO2 100 % 09/09/18 1342  Vitals shown include unvalidated device data.  Last Pain:  Vitals:   09/09/18 0805  TempSrc: Oral  PainSc: 0-No pain      Patients Stated Pain Goal: 1 (69/62/95 2841)  Complications: No apparent anesthesia complications

## 2018-09-09 NOTE — Care Management Important Message (Signed)
Important Message  Patient Details  Name: Julie Richards MRN: [id number] Date of Birth: [birth year]   Medicare Important Message Given:  Yes     Laniece Hornbaker Montine Circle 09/09/2018, 8:52 AM

## 2018-09-09 NOTE — Anesthesia Procedure Notes (Signed)
Procedure Name: Intubation Date/Time: 09/09/2018 10:45 AM Performed by: Brennan Bailey, MD Pre-anesthesia Checklist: Patient identified, Emergency Drugs available, Suction available and Patient being monitored Patient Re-evaluated:Patient Re-evaluated prior to induction Oxygen Delivery Method: Circle System Utilized Preoxygenation: Pre-oxygenation with 100% oxygen Induction Type: IV induction Ventilation: Mask ventilation without difficulty Laryngoscope Size: Miller and 3 Grade View: Grade II Tube type: Oral Tube size: 7.0 mm Number of attempts: 2 Airway Equipment and Method: Stylet Placement Confirmation: ETT inserted through vocal cords under direct vision,  positive ETCO2 and breath sounds checked- equal and bilateral Secured at: 22 cm Tube secured with: Tape Dental Injury: Teeth and Oropharynx as per pre-operative assessment  Difficulty Due To: Difficulty was unanticipated Comments: First attempt by CRNA with Mac 3 blade, unable to view glottis. Second attempt by myself with Sabra Heck 3 blade, grade 2B view. Atraumatic intubation. Mask ventilated between attempts with SpO2 >98% throughout. Daiva Huge, MD

## 2018-09-10 ENCOUNTER — Encounter (HOSPITAL_COMMUNITY): Payer: Self-pay | Admitting: Surgery

## 2018-09-10 LAB — BASIC METABOLIC PANEL
Anion gap: 10 (ref 5–15)
BUN: 13 mg/dL (ref 8–23)
CO2: 22 mmol/L (ref 22–32)
Calcium: 8.7 mg/dL — ABNORMAL LOW (ref 8.9–10.3)
Chloride: 107 mmol/L (ref 98–111)
Creatinine, Ser: 1.03 mg/dL — ABNORMAL HIGH (ref 0.44–1.00)
GFR calc Af Amer: >60 mL/min (ref >60)
GFR calc non Af Amer: 57 mL/min — ABNORMAL LOW (ref >60)
Glucose, Bld: 138 mg/dL — ABNORMAL HIGH (ref 70–99)
Potassium: 4.1 mmol/L (ref 3.5–5.1)
Sodium: 139 mmol/L (ref 135–145)

## 2018-09-10 LAB — CBC
HCT: 39.6 % (ref 36.0–46.0)
Hemoglobin: 11.9 g/dL — ABNORMAL LOW (ref 12.0–15.0)
MCH: 27.4 pg (ref 26.0–34.0)
MCHC: 30.1 g/dL (ref 30.0–36.0)
MCV: 91.2 fL (ref 80.0–100.0)
Platelets: 323 10*3/uL (ref 150–400)
RBC: 4.34 MIL/uL (ref 3.87–5.11)
RDW: 17.1 % — ABNORMAL HIGH (ref 11.5–15.5)
WBC: 15.2 10*3/uL — ABNORMAL HIGH (ref 4.0–10.5)
nRBC: 0 % (ref 0.0–0.2)

## 2018-09-10 LAB — SURGICAL PCR SCREEN
MRSA, PCR: NEGATIVE
Staphylococcus aureus: NEGATIVE

## 2018-09-10 MED ORDER — MUPIROCIN 2 % EX OINT
1.0000 "application " | TOPICAL_OINTMENT | Freq: Two times a day (BID) | CUTANEOUS | Status: DC
  Filled 2018-09-10: qty 22

## 2018-09-10 NOTE — Progress Notes (Signed)
Spoke with nurse regarding blood draw.  Midline does not have blood return to draw labs, lab will need to collect.  Carolee Rota, RN

## 2018-09-10 NOTE — Progress Notes (Signed)
Nutrition Follow-up   RD working remotely.  DOCUMENTATION CODES:   Morbid obesity  INTERVENTION:  Once diet advances,   Continue Ensure Enlive po BID, each supplement provides 350 kcal and 20 grams of protein.  Continue Boost Breeze po BID, each supplement provides 250 kcal and 9 grams of protein.  NUTRITION DIAGNOSIS:   Increased nutrient needs related to acute illness(abdominal abscess) as evidenced by estimated needs; ongoing  GOAL:   Patient will meet greater than or equal to 90% of their needs; not met  MONITOR:   PO intake, Supplement acceptance, Skin, Weight trends, Labs, I & O's  REASON FOR ASSESSMENT:   Consult Assessment of nutrition requirement/status  ASSESSMENT:   68 year old woman who was just discharged from our service 1 month ago with a percutaneous drain in place for treatment of perforated appendicitis presents with abdominal pain. Fistula to cecum. 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 Per MD pt to remain NPO until return in bowel function. Once diet advances, continue Ensure and Boost Breeze supplementation to aid in caloric and protein needs as well as in wound healing.   Labs and medications reviewed.   Diet Order:   Diet Order            Diet NPO time specified Except for: Ice Chips, Sips with Meds  Diet effective midnight              EDUCATION NEEDS:   Education needs have been addressed  Skin:  Skin Assessment: Skin Integrity Issues: Skin Integrity Issues:: Incisions Incisions: abdomen  Last BM:  8/19  Height:   Ht Readings from Last 1 Encounters:  09/09/18 5' 9.02" (1.753 m)    Weight:   Wt Readings from Last 1 Encounters:  09/09/18 135 kg    Ideal Body Weight:  65.9 kg  BMI:  Body mass index is 43.93 kg/m.  Estimated Nutritional Needs:   Kcal:   1850-2050  Protein:  100-110 grams  Fluid:  1.8 -2 L/day    Corrin Parker, MS, RD, LDN Pager # [phone] After hours/ weekend pager # [phone]

## 2018-09-10 NOTE — Progress Notes (Signed)
Pt tolerated sitting up in the chair for 1 hour this afternoon. Used BSC with standby assist. ABD dressing changed per order. Pt tolerated well.

## 2018-09-10 NOTE — Progress Notes (Signed)
[facility] Progress Note  1 Day Post-Op  Subjective: CC-  Slept well last night. States that she has only used the PCA once. Mild bloating. No flatus or BM.  Objective: Vital signs in last 24 hours: Temp:  [96 F (35.6 C)-98.1 F (36.7 C)] 97.3 F (36.3 C) (08/20 0330) Pulse Rate:  [63-96] 88 (08/20 0330) Resp:  [13-22] 20 (08/20 0400) BP: (98-121)/(56-79) 105/65 (08/20 0330) SpO2:  [91 %-100 %] 100 % (08/20 0400) Weight:  [135 kg] 135 kg (08/19 0947) Last BM Date: 09/09/18  Intake/Output from previous day: 08/19 0701 - 08/20 0700 In: 3294.1 [P.O.:120; I.V.:2760.3; IV Piggyback:413.8] Out: 1900 [Urine:1750; Blood:150] Intake/Output this shift: No intake/output data recorded.  PE: Gen: Alert, NAD Pulm: rate and effort normal Ext:no BLE edema Psych: A&Ox3  Skin: no rashes noted, warm and dry WUJ:WJXBAbd:Soft, mild distension, hypoactive BS, mild diffuse tenderness, no peritonitis, lap incisions with cdi dressings in place, open midline wound clean/ no purulent drainage or cellulitis/ trace bloody oozing     Lab Results:  No results for input(s): WBC, HGB, HCT, PLT in the last 72 hours. BMET No results for input(s): NA, K, CL, CO2, GLUCOSE, BUN, CREATININE, CALCIUM in the last 72 hours. PT/INR No results for input(s): LABPROT, INR in the last 72 hours. CMP     Component Value Date/Time   NA 140 09/07/2018 0351   K 3.7 09/07/2018 0351   CL 106 09/07/2018 0351   CO2 26 09/07/2018 0351   GLUCOSE 97 09/07/2018 0351   BUN 16 09/07/2018 0351   CREATININE 0.91 09/07/2018 0351   CALCIUM 8.9 09/07/2018 0351   PROT 7.1 08/25/2018 1329   ALBUMIN 3.3 (L) 08/25/2018 1329   AST 18 08/25/2018 1329   ALT 24 08/25/2018 1329   ALKPHOS 88 08/25/2018 1329   BILITOT 1.6 (H) 08/25/2018 1329   GFRNONAA >60 09/07/2018 0351   GFRAA >60 09/07/2018 0351   Lipase     Component Value Date/Time   LIPASE 24 08/25/2018 1329       Studies/Results: No results found.   Anti-infectives: Anti-infectives (From admission, onward)   Start     Dose/Rate Route Frequency Ordered Stop   09/08/18 1400  neomycin (MYCIFRADIN) tablet 1,000 mg     1,000 mg Oral 3 times per day 09/08/18 0812 09/08/18 2145   09/08/18 1400  metroNIDAZOLE (FLAGYL) tablet 1,000 mg     1,000 mg Oral 3 times per day 09/08/18 0812 09/08/18 2145   08/28/18 0000  amoxicillin-clavulanate (AUGMENTIN) 875-125 MG tablet     1 tablet Oral 2 times daily 08/28/18 1530 09/11/18 2359   08/26/18 0600  piperacillin-tazobactam (ZOSYN) IVPB 3.375 g     3.375 g 12.5 mL/hr over 240 Minutes Intravenous Every 8 hours 08/25/18 2141     08/25/18 2115  ceFEPIme (MAXIPIME) 2 g in sodium chloride 0.9 % 100 mL IVPB     2 g 200 mL/hr over 30 Minutes Intravenous  Once 08/25/18 2102 08/25/18 2229   08/25/18 2115  metroNIDAZOLE (FLAGYL) IVPB 500 mg     500 mg 100 mL/hr over 60 Minutes Intravenous  Once 08/25/18 2102 08/25/18 2257       Assessment/Plan Hx of gastric banding- placed >20 years ago in [place] as part of a study. Lap band noted to slip inferiorly on stomach on CT. Reviewed with Dr. Andrey CampanileWilson who advised follow up as this could put her at risk for ulcerations and perforation.  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 Brantley Stage - POD#1 - surgical pathology pending - twice daily wet to dry dressing changes to midline wound  FEN -IVF,NPO except ice chips VTE -SCDs, lovenox ID -Zosyn 8/5>> Foley - d/c 8/20 Follow up - Dr. Brantley Stage  Plan: Labs pending. Keep NPO and await return in bowel function. Mobilize. PT consult. Continue PCA and scheduled IV tylenol and robaxin today. D/c foley.   LOS: 15 days    [name] , [facility] 09/10/2018, 8:15 AM Pager: [phone] Mon-Thurs 7:00 am-4:30 pm Fri 7:00 am -11:30 AM  Sat-Sun 7:00 am-11:30 am

## 2018-09-10 NOTE — Evaluation (Signed)
Physical Therapy Evaluation Patient Details Name: Julie Richards MRN: [id number] DOB: 22-May-1950 Today's Date: 09/10/2018   History of Present Illness  Pt is a 68 y/o female admitted secondary to increased RLQ pain. 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 Pt with recent admission secondary to perforated appendicitis s/p drain placement.   Clinical Impression  Pt s/p surgery above with deficits below. Pt requiring mod A to complete bed mobility tasks. Pt able to sit at EOB for ~5 min during session. Reported dizziness and increased pain, so further mobility deferred. Pt very concerned about returning home alone and does not feel she can care for herself. Feel she will require increased assist at d/c. Will continue to follow acutely to maximize functional mobility independence and safety.     Follow Up Recommendations SNF    Equipment Recommendations  None recommended by PT    Recommendations for Other Services       Precautions / Restrictions Precautions Precautions: Fall Restrictions Weight Bearing Restrictions: No      Mobility  Bed Mobility Overal bed mobility: Needs Assistance Bed Mobility: Rolling;Sidelying to Sit;Sit to Sidelying Rolling: Mod assist Sidelying to sit: Mod assist     Sit to sidelying: Min assist General bed mobility comments: Mod A for LE assist and trunk elevation. Increased time required to come to EOB. Was able to sit at EOB for ~5 mins. Reported dizziness and seeing 2-4 of therapist, so returned to supine.   Transfers                 General transfer comment: unable   Ambulation/Gait                Stairs            Wheelchair Mobility    Modified Rankin (Stroke Patients Only)       Balance Overall balance assessment: Needs assistance Sitting-balance support: No upper extremity supported;Feet supported Sitting balance-Leahy Scale:  Fair                                       Pertinent Vitals/Pain Pain Assessment: Faces Faces Pain Scale: Hurts whole lot Pain Location: Abdomen Pain Descriptors / Indicators: Grimacing;Guarding;Sharp Pain Intervention(s): Limited activity within patient's tolerance;Monitored during session;Repositioned;PCA encouraged    Home Living Family/patient expects to be discharged to:: Private residence Living Arrangements: Alone Available Help at Discharge: Family;Available PRN/intermittently Type of Home: House Home Access: Stairs to enter Entrance Stairs-Rails: None Entrance Stairs-Number of Steps: 2-3 Home Layout: Two level Home Equipment: Walker - 2 wheels      Prior Function Level of Independence: Independent with assistive device(s)         Comments: Had been using RW since previous surgery.      Hand Dominance        Extremity/Trunk Assessment   Upper Extremity Assessment Upper Extremity Assessment: Defer to OT evaluation    Lower Extremity Assessment Lower Extremity Assessment: Generalized weakness    Cervical / Trunk Assessment Cervical / Trunk Assessment: Other exceptions Cervical / Trunk Exceptions: s/p abdominal surgery  Communication   Communication: No difficulties  Cognition Arousal/Alertness: Awake/alert Behavior During Therapy: WFL for tasks assessed/performed Overall Cognitive Status: Within Functional Limits for tasks assessed  General Comments General comments (skin integrity, edema, etc.): Educated about using pillow to brace abdoment to assist with pain management.     Exercises     Assessment/Plan    PT Assessment Patient needs continued PT services  PT Problem List Decreased strength;Decreased balance;Decreased mobility;Decreased activity tolerance;Decreased knowledge of use of DME;Decreased knowledge of precautions;Pain       PT Treatment Interventions Gait  training;DME instruction;Functional mobility training;Therapeutic activities;Therapeutic exercise;Stair training;Balance training;Patient/family education    PT Goals (Current goals can be found in the Care Plan section)  Acute Rehab PT Goals Patient Stated Goal: to get stronger before going home PT Goal Formulation: With patient Time For Goal Achievement: 09/24/18 Potential to Achieve Goals: Good    Frequency Min 2X/week   Barriers to discharge Decreased caregiver support      Co-evaluation               AM-PAC PT "6 Clicks" Mobility  Outcome Measure Help needed turning from your back to your side while in a flat bed without using bedrails?: A Lot Help needed moving from lying on your back to sitting on the side of a flat bed without using bedrails?: A Lot Help needed moving to and from a bed to a chair (including a wheelchair)?: A Lot Help needed standing up from a chair using your arms (e.g., wheelchair or bedside chair)?: A Lot Help needed to walk in hospital room?: A Lot Help needed climbing 3-5 steps with a railing? : Total 6 Click Score: 11    End of Session Equipment Utilized During Treatment: Gait belt Activity Tolerance: Patient limited by pain;Treatment limited secondary to medical complications (Comment)(dizziness) Patient left: in bed;with call bell/phone within reach Nurse Communication: Mobility status PT Visit Diagnosis: Other abnormalities of gait and mobility (R26.89);Muscle weakness (generalized) (M62.81);Difficulty in walking, not elsewhere classified (R26.2);Pain Pain - part of body: (abdomen)    Time: 6283-1517 PT Time Calculation (min) (ACUTE ONLY): 19 min   Charges:   PT Evaluation $PT Eval Moderate Complexity: Jensen, PT, DPT  Acute Rehabilitation Services  Pager: [phone] Office: [phone]   Rudean Hitt 09/10/2018, 11:13 AM

## 2018-09-10 NOTE — Plan of Care (Deleted)
  Problem: Education: Goal: Knowledge of General Education information will improve Description: Including pain rating scale, medication(s)/side effects and non-pharmacologic comfort measures Outcome: Progressing   Problem: Activity: Goal: Risk for activity intolerance will decrease Outcome: Progressing   Problem: Pain Managment: Goal: General experience of comfort will improve Outcome: Progressing   

## 2018-09-10 NOTE — Plan of Care (Signed)
  Problem: Pain Managment: Goal: General experience of comfort will improve Outcome: Progressing   Problem: Skin Integrity: Goal: Risk for impaired skin integrity will decrease Outcome: Progressing   Problem: Skin Integrity: Goal: Risk for impaired skin integrity will decrease Outcome: Progressing   Problem: Elimination: Goal: Will not experience complications related to bowel motility Outcome: Progressing

## 2018-09-10 NOTE — Plan of Care (Signed)

## 2018-09-10 NOTE — Plan of Care (Signed)
  Problem: Education: Goal: Knowledge of General Education information will improve Description: Including pain rating scale, medication(s)/side effects and non-pharmacologic comfort measures Outcome: Progressing   Problem: Activity: Goal: Risk for activity intolerance will decrease 09/10/2018 1611 by Governor Rooks, RN Outcome: Progressing 09/10/2018 1235 by Governor Rooks, RN Outcome: Progressing   Problem: Elimination: Goal: Will not experience complications related to urinary retention Outcome: Completed/Met   Problem: Pain Managment: Goal: General experience of comfort will improve 09/10/2018 1611 by Governor Rooks, RN Outcome: Progressing 09/10/2018 1235 by Governor Rooks, RN Outcome: Progressing

## 2018-09-11 MED ORDER — METHOCARBAMOL 500 MG PO TABS
750.0000 mg | ORAL_TABLET | Freq: Three times a day (TID) | ORAL | Status: DC
  Administered 2018-09-11 – 2018-09-13 (×8): 750 mg via ORAL
  Filled 2018-09-11 (×9): qty 2

## 2018-09-11 MED ORDER — ACETAMINOPHEN 10 MG/ML IV SOLN
1000.0000 mg | Freq: Four times a day (QID) | INTRAVENOUS | Status: AC
  Administered 2018-09-11 – 2018-09-12 (×4): 1000 mg via INTRAVENOUS
  Filled 2018-09-11 (×4): qty 100

## 2018-09-11 NOTE — NC FL2 (Signed)
[place] MEDICAID FL2 LEVEL OF CARE SCREENING TOOL     IDENTIFICATION  Patient Name: Julie Richards Birthdate: 08-05-1950 Sex: female Admission Date (Current Location): 08/25/2018  [facility] and [place] Number:  Herbalist and Address:  [facility], [place] [address] [place], [place] [address]      Provider Number: [id number]  Attending Physician Name and Address:  Edison Pace, Md, MD  Relative Name and Phone Number:  [name] (GYIRSWNI)[phone]    Current Level of Care: Hospital Recommended Level of Care: [place] Prior Approval Number:    Date Approved/Denied:   PASRR Number: [id number]  Discharge Plan: SNF    Current Diagnoses: Patient Active Problem List   Diagnosis Date Noted  . Postprocedural intraabdominal abscess 08/25/2018  . Abscess of abdominal cavity ([place]) 07/18/2018  . Perforated appendicitis 07/09/2018    Orientation RESPIRATION BLADDER Height & Weight     Self, Time, Situation, Place  Normal Continent Weight: 297 lb 9.9 oz (135 kg) Height:  5' 9.02" (175.3 cm)  BEHAVIORAL SYMPTOMS/MOOD NEUROLOGICAL BOWEL NUTRITION STATUS      Continent Diet(see discharge summary)  AMBULATORY STATUS COMMUNICATION OF NEEDS Skin   Limited Assist Verbally Surgical wounds(right lower abdomen closed surgical incision)                       Personal Care Assistance Level of Assistance  Bathing, Dressing, Total care, Feeding Bathing Assistance: Limited assistance Feeding assistance: Independent Dressing Assistance: Limited assistance Total Care Assistance: Limited assistance   Functional Limitations Info  Sight, Speech, Hearing Sight Info: Adequate Hearing Info: Adequate Speech Info: Adequate    SPECIAL CARE FACTORS FREQUENCY  PT (By licensed PT), OT (By licensed OT)     PT Frequency: min 5x weekly OT Frequency: min 5x weekly            Contractures Contractures Info: Not present    Additional Factors  Info  Code Status, Allergies Code Status Info: Full Allergies Info: Oxycodone Hcl           Current Medications (09/11/2018):  This is the current hospital active medication list Current Facility-Administered Medications  Medication Dose Route Frequency Provider Last Rate Last Dose  . 0.9 %  sodium chloride infusion   Intravenous Continuous Focht, Jessica L, PA   Stopped at 09/10/18 2132  . acetaminophen (TYLENOL) tablet 650 mg  650 mg Oral Q6H PRN Focht, Jessica L, PA   650 mg at 08/28/18 1342   Or  . acetaminophen (TYLENOL) suppository 650 mg  650 mg Rectal Q6H PRN Focht, Jessica L, PA      . bisacodyl (DULCOLAX) suppository 10 mg  10 mg Rectal Daily PRN Romana Juniper A, MD   10 mg at 09/08/18 1436  . diphenhydrAMINE (BENADRYL) 12.5 MG/5ML elixir 12.5 mg  12.5 mg Oral Q6H PRN Focht, Jessica L, PA       Or  . diphenhydrAMINE (BENADRYL) injection 12.5 mg  12.5 mg Intravenous Q6H PRN Focht, Jessica L, PA   12.5 mg at 08/26/18 2130  . docusate sodium (COLACE) capsule 100 mg  100 mg Oral BID Romana Juniper A, MD   100 mg at 09/11/18 9678  . enoxaparin (LOVENOX) injection 40 mg  40 mg Subcutaneous Daily Focht, Jessica L, PA   40 mg at 09/11/18 0828  . feeding supplement (BOOST / RESOURCE BREEZE) liquid 1 Container  1 Container Oral BID BM Rolm Bookbinder, MD   1 Container at 09/08/18 1400  .  feeding supplement (ENSURE ENLIVE) (ENSURE ENLIVE) liquid 237 mL  237 mL Oral BID BM Emelia LoronWakefield, Matthew, MD   237 mL at 09/08/18 0700  . hydrALAZINE (APRESOLINE) injection 10 mg  10 mg Intravenous Q2H PRN Focht, Jessica L, PA      . HYDROmorphone (DILAUDID) 1 mg/mL PCA injection   Intravenous Q4H Focht, Jessica L, PA   30 mg at 09/09/18 1406  . HYDROmorphone (DILAUDID) injection 0.5 mg  0.5 mg Intravenous Q3H PRN Meuth, Brooke A, PA-C   0.5 mg at 08/28/18 0954  . lactated ringers infusion   Intravenous Continuous Focht, Jessica L, PA      . methocarbamol (ROBAXIN) 500 mg in dextrose 5 % 50 mL IVPB   500 mg Intravenous Q8H Focht, Jessica L, PA   Stopped at 09/10/18 2202  . methocarbamol (ROBAXIN) tablet 750 mg  750 mg Oral Q8H PRN Barnetta Chapelsborne, Kelly, PA-C   750 mg at 08/31/18 0113  . metoprolol tartrate (LOPRESSOR) injection 5 mg  5 mg Intravenous Q6H PRN Focht, Jessica L, PA      . naloxone (NARCAN) injection 0.4 mg  0.4 mg Intravenous PRN Focht, Jessica L, PA       And  . sodium chloride flush (NS) 0.9 % injection 9 mL  9 mL Intravenous PRN Focht, Jessica L, PA      . ondansetron (ZOFRAN-ODT) disintegrating tablet 4 mg  4 mg Oral Q6H PRN Focht, Jessica L, PA       Or  . ondansetron (ZOFRAN) injection 4 mg  4 mg Intravenous Q6H PRN Focht, Jessica L, PA   4 mg at 09/10/18 0944  . piperacillin-tazobactam (ZOSYN) IVPB 3.375 g  3.375 g Intravenous Q8H Focht, Jessica L, PA 12.5 mL/hr at 09/11/18 0518 3.375 g at 09/11/18 0518  . polyethylene glycol (MIRALAX / GLYCOLAX) packet 17 g  17 g Oral Daily PRN Meuth, Brooke A, PA-C      . sodium chloride flush (NS) 0.9 % injection 10-40 mL  10-40 mL Intracatheter Q12H Focht, Jessica L, PA   10 mL at 09/10/18 0924  . sodium chloride flush (NS) 0.9 % injection 10-40 mL  10-40 mL Intracatheter PRN Focht, Jessica L, PA   10 mL at 08/30/18 1434  . sodium chloride flush (NS) 0.9 % injection 5 mL  5 mL Intracatheter Q8H Wagner, Jaime, DO   5 mL at 09/10/18 1320  . traMADol (ULTRAM) tablet 50 mg  50 mg Oral Q6H PRN Meuth, Brooke A, PA-C   50 mg at 08/31/18 0113     Discharge Medications: Please see discharge summary for a list of discharge medications.  Relevant Imaging Results:  Relevant Lab Results:   Additional Information SSN: [id number]  Gildardo GriffesAshley M Lafayette Dunlevy, LCSW

## 2018-09-11 NOTE — Evaluation (Signed)
Occupational Therapy Evaluation Patient Details Name: Julie Richards MRN: [id number] DOB: Julie Richards Today's Date: 09/11/2018    History of Present Illness Pt is a 68 y/o female admitted secondary to increased RLQ pain. 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 Pt with recent admission secondary to perforated appendicitis s/p drain placement.    Clinical Impression   This 68 yo female admitted with above presents to acute OT with increased pain, decreased strength, decreased balance, decreased mobility all affecting her safety and independence with basic ADLs. She will benefit from acute OT with follow up at SNF to get to a Mod I to independent level to go home.    Follow Up Recommendations  SNF;Supervision/Assistance - 24 hour    Equipment Recommendations  Other (comment)(TBD next venue)       Precautions / Restrictions Precautions Precautions: Fall Precaution Comments: abdominal wound Restrictions Weight Bearing Restrictions: No      Mobility Bed Mobility Overal bed mobility: Needs Assistance Bed Mobility: Sit to Supine       Sit to supine: Min guard   General bed mobility comments: increased time, HOB up and use fo rails  Transfers Overall transfer level: Needs assistance Equipment used: None Transfers: Sit to/from [employer] CorporationStand;Stand Pivot Transfers Sit to Stand: Min guard Stand pivot transfers: Min guard       General transfer comment: min guard A sit<>stand (increased time)    Balance Overall balance assessment: Needs assistance Sitting-balance support: No upper extremity supported;Feet supported Sitting balance-Leahy Scale: Good     Standing balance support: Single extremity supported Standing balance-Leahy Scale: Poor                             ADL either performed or assessed with clinical judgement   ADL Overall ADL's : Needs  assistance/impaired Eating/Feeding: Independent Eating/Feeding Details (indicate cue type and reason): ice chips Grooming: Set up;Sitting   Upper Body Bathing: Minimal assistance;Sitting Upper Body Bathing Details (indicate cue type and reason): due to abdominal wound Lower Body Bathing: Maximal assistance Lower Body Bathing Details (indicate cue type and reason): min guard A sit<>stand (increased time) Upper Body Dressing : Minimal assistance;Sitting   Lower Body Dressing: Total assistance Lower Body Dressing Details (indicate cue type and reason): min guard A sit<>stand (increased time) Toilet Transfer: Min [employer] systems engineerguard;Stand-pivot Toilet Transfer Details (indicate cue type and reason): recliner to bed Toileting- Clothing Manipulation and Hygiene: Maximal assistance Toileting - Clothing Manipulation Details (indicate cue type and reason): min guard A sit<>stand (increased time)       General ADL Comments: Pt report she is using inspirometer 10 times every hour she is awake. Also encouaraged pt to purse lipped breathing (due to increase HR-122 and decrease sats with mobility--down from 94 to 91% on RA with movement; 94% on RA at end of session)     Vision Patient Visual Report: No change from baseline              Pertinent Vitals/Pain Pain Assessment: 0-10 Pain Score: 6  Pain Location: Abdomen Pain Descriptors / Indicators: Grimacing;Guarding Pain Intervention(s): Limited activity within patient's tolerance;Monitored during session;Repositioned;PCA encouraged(pt chose not to use PCA)     Hand Dominance  right   Extremity/Trunk Assessment Upper Extremity Assessment Upper Extremity Assessment: Overall WFL for tasks assessed           Communication  no issues   Cognition Arousal/Alertness: Awake/alert Behavior During Therapy:  WFL for tasks assessed/performed Overall Cognitive Status: Within Functional Limits for tasks assessed                                                 Home Living Family/patient expects to be discharged to:: Skilled nursing facility                                                 OT Problem List: Decreased range of motion;Decreased strength;Impaired balance (sitting and/or standing);Pain;Obesity;Decreased knowledge of use of DME or AE      OT Treatment/Interventions: Self-care/ADL training;DME and/or AE instruction;Energy conservation;Patient/family education;Balance training    OT Goals(Current goals can be found in the care plan section) Acute Rehab OT Goals Patient Stated Goal: to go to rehab then  home OT Goal Formulation: With patient Time For Goal Achievement: 09/25/18 Potential to Achieve Goals: Good  OT Frequency: Min 2X/week   Barriers to D/C: Decreased caregiver support             AM-PAC OT "6 Clicks" Daily Activity     Outcome Measure Help from another person eating meals?: None Help from another person taking care of personal grooming?: A Little Help from another person toileting, which includes using toliet, bedpan, or urinal?: A Lot Help from another person bathing (including washing, rinsing, drying)?: A Lot Help from another person to put on and taking off regular upper body clothing?: A Little Help from another person to put on and taking off regular lower body clothing?: A Lot 6 Click Score: 16   End of Session    Activity Tolerance: Patient limited by pain Patient left: in bed;with call bell/phone within reach;with bed alarm set  OT Visit Diagnosis: Unsteadiness on feet (R26.81);Other abnormalities of gait and mobility (R26.89);Muscle weakness (generalized) (M62.81);Pain Pain - part of body: (absomen)                Time: 3382-5053 OT Time Calculation (min): 21 min Charges:  OT General Charges $OT Visit: 1 Visit OT Evaluation $OT Eval Moderate Complexity: 1 Mod  [address] Circle, OTR/L Acute [employer] Pager [phone] Office  [phone]     Almon Register 09/11/2018, 12:24 PM

## 2018-09-11 NOTE — TOC Initial Note (Signed)
Transition of Care [facility]) - Initial/Assessment Note    Patient Details  Name: Julie Richards MRN: [id number] Date of Birth: 05-14-1950  [facility] of [facility]) CM/SW Contact:    Julie Richards, [place] Phone Number: [phone] 09/11/2018, 10:35 AM  Clinical Narrative:                  CSW consulted with patient regarding discharge planning and SNF recommendation for short term rehab. Patient is in agreement as she reports she lives alone and has no assistance at home currently. Patient reports preference for [facility] as this is close to her home. She reports second preference for [facility]. Patient gave CSW permission to fax out referrals to both facilities for bed offers. CSW will inform of bed acceptance once received.   Expected Discharge Plan: Skilled Nursing Facility Barriers to Discharge: Continued Medical Work up   Patient Goals and CMS Choice Patient states their goals for this hospitalization and ongoing recovery are:: to go to short term rehab CMS Medicare.gov Compare Post Acute Care list provided to:: Patient Choice offered to / list presented to : Patient  Expected Discharge Plan and Services Expected Discharge Plan: [place] Acute Care Choice: [place] arrangements for the past 2 months: Single Family Home Expected Discharge Date: 08/29/18                                    Prior Living Arrangements/Services Living arrangements for the past 2 months: Single Family Home Lives with:: Self Patient language and need for interpreter reviewed:: Yes Do you feel safe going back to the place where you live?: No   lives alone needs short term rehab  Need for Family Participation in Patient Care: No (Comment) Care giver support system in place?: Yes (comment)   Criminal Activity/Legal Involvement Pertinent to Current Situation/Hospitalization: No - Comment as needed  Activities of Daily Living Home  Assistive Devices/Equipment: Walker (specify type) ADL Screening (condition at time of admission) Patient's cognitive ability adequate to safely complete daily activities?: Yes Is the patient deaf or have difficulty hearing?: No Does the patient have difficulty seeing, even when wearing glasses/contacts?: No Does the patient have difficulty concentrating, remembering, or making decisions?: No Patient able to express need for assistance with ADLs?: Yes Does the patient have difficulty dressing or bathing?: No Independently performs ADLs?: Yes (appropriate for developmental age) Does the patient have difficulty walking or climbing stairs?: Yes Weakness of Legs: Both Weakness of Arms/Hands: None  Permission Sought/Granted Permission sought to share information with : Case Manager, Customer service manager, Family Supports    Share Information with NAME: Julie Richards  Permission granted to share info w AGENCY: SNFs  Permission granted to share info w Relationship: daughter  Permission granted to share info w Contact Information: [phone]  Emotional Assessment Appearance:: Appears stated age Attitude/Demeanor/Rapport: Gracious Affect (typically observed): Calm Orientation: : Oriented to Self, Oriented to Place, Oriented to  Time, Oriented to Situation Alcohol / Substance Use: Not Applicable Psych Involvement: No (comment)  Admission diagnosis:  Abdominal visceral abscess ([place]) [K65.1] Postprocedural intraabdominal abscess [T81.49XA] Patient Active Problem List   Diagnosis Date Noted  . Postprocedural intraabdominal abscess 08/25/2018  . Abscess of abdominal cavity ([place]) 07/18/2018  . Perforated appendicitis 07/09/2018   PCP:  Julie Ebbs, MD Pharmacy:   [place], [place]  [address] [facility] [address] [place] [id number] Phone: [phone] Fax: [phone]     Social Determinants of  Health (SDOH) Interventions    Readmission Risk Interventions No flowsheet data found.

## 2018-09-11 NOTE — Plan of Care (Signed)

## 2018-09-11 NOTE — Progress Notes (Signed)
[facility] Progress Note  2 Days Post-Op  Subjective: CC-  Overall doing ok. Abdominal pain well controlled with PCA, but still hurts a lot during dressing changes. Denies n/v. No flatus or BM yesterday. Denies abdominal bloating. She has already gotten up today and spent time sitting in the chair.  Objective: Vital signs in last 24 hours: Temp:  [97.7 F (36.5 C)-99.2 F (37.3 C)] 98.2 F (36.8 C) (08/21 0436) Pulse Rate:  [65-98] 98 (08/21 0436) Resp:  [16-25] 25 (08/21 0830) BP: (87-122)/(61-82) 118/74 (08/21 0436) SpO2:  [95 %-100 %] 95 % (08/21 0830) FiO2 (%):  [99 %] 99 % (08/21 0528) Last BM Date: 09/03/18  Intake/Output from previous day: 08/20 0701 - 08/21 0700 In: 3075 [P.O.:1120; I.V.:1283.9; IV Piggyback:671.1] Out: -  Intake/Output this shift: No intake/output data recorded.  PE: Gen: Alert, NAD Pulm: rate and effort normal Ext:no BLE edema Psych: A&Ox3  Skin: no rashes noted, warm and dry WUJ:WJXB, mild distension, hypoactive BS, mild diffuse tenderness, no peritonitis, lap incisions cdi with staples intact, open midline wound clean/ no purulent drainage or cellulitis/ trace bloody oozing from area just proximal to umbilicus     Lab Results:  Recent Labs    09/10/18 1028  WBC 15.2*  HGB 11.9*  HCT 39.6  PLT 323   BMET Recent Labs    09/10/18 1617  NA 139  K 4.1  CL 107  CO2 22  GLUCOSE 138*  BUN 13  CREATININE 1.03*  CALCIUM 8.7*   PT/INR No results for input(s): LABPROT, INR in the last 72 hours. CMP     Component Value Date/Time   NA 139 09/10/2018 1617   K 4.1 09/10/2018 1617   CL 107 09/10/2018 1617   CO2 22 09/10/2018 1617   GLUCOSE 138 (H) 09/10/2018 1617   BUN 13 09/10/2018 1617   CREATININE 1.03 (H) 09/10/2018 1617   CALCIUM 8.7 (L) 09/10/2018 1617   PROT 7.1 08/25/2018 1329   ALBUMIN 3.3 (L) 08/25/2018 1329   AST 18 08/25/2018 1329   ALT 24 08/25/2018 1329   ALKPHOS 88 08/25/2018 1329   BILITOT  1.6 (H) 08/25/2018 1329   GFRNONAA 57 (L) 09/10/2018 1617   GFRAA >60 09/10/2018 1617   Lipase     Component Value Date/Time   LIPASE 24 08/25/2018 1329       Studies/Results: No results found.  Anti-infectives: Anti-infectives (From admission, onward)   Start     Dose/Rate Route Frequency Ordered Stop   09/08/18 1400  neomycin (MYCIFRADIN) tablet 1,000 mg     1,000 mg Oral 3 times per day 09/08/18 0812 09/08/18 2145   09/08/18 1400  metroNIDAZOLE (FLAGYL) tablet 1,000 mg     1,000 mg Oral 3 times per day 09/08/18 0812 09/08/18 2145   08/28/18 0000  amoxicillin-clavulanate (AUGMENTIN) 875-125 MG tablet     1 tablet Oral 2 times daily 08/28/18 1530 09/11/18 2359   08/26/18 0600  piperacillin-tazobactam (ZOSYN) IVPB 3.375 g     3.375 g 12.5 mL/hr over 240 Minutes Intravenous Every 8 hours 08/25/18 2141     08/25/18 2115  ceFEPIme (MAXIPIME) 2 g in sodium chloride 0.9 % 100 mL IVPB     2 g 200 mL/hr over 30 Minutes Intravenous  Once 08/25/18 2102 08/25/18 2229   08/25/18 2115  metroNIDAZOLE (FLAGYL) IVPB 500 mg     500 mg 100 mL/hr over 60 Minutes Intravenous  Once 08/25/18 2102 08/25/18 2257  Assessment/Plan Hx of gastric banding- [place] of [place] study. Lap band noted to slip inferiorly on stomach on CT. Reviewed with Dr. Andrey CampanileWilson who advised follow up as this could put her at risk for ulcerations and perforation.  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 Luisa Hartornett - POD#2 - surgical pathology: PERICECAL ABSCESS WITH GRANULATION TISSUE AND FIBROSIS CONSISTENT WITH PERFORATION; ONE BENIGN REACTIVE LYMPH NODE; NO EVIDENCE OF MALIGNANCY - twice daily wet to dry dressing changes to midline wound - no flatus or BM  FEN -IVF,NPO except ice chips VTE -SCDs, lovenox ID -Zosyn 8/5>>  Foley - d/c 8/20 Follow up - Dr. Luisa Hartornett  Plan: Continue just ice chips and await return in bowel function. Mobilize. Continue therapies, currently recommending SNF. Continue PCA and scheduled IV tylenol and robaxin. Will discuss length of antibiotics with MD. Labs in AM.   LOS: 16 days    Franne FortsBrooke A  , [facility] 09/11/2018, 9:47 AM Pager: [phone] Mon-Thurs 7:00 am-4:30 pm Fri 7:00 am -11:30 AM Sat-Sun 7:00 am-11:30 am

## 2018-09-11 NOTE — Progress Notes (Signed)
Physical Therapy Treatment Patient Details Name: Julie Richards MRN: [id number] DOB: [birth year] Today's Date: 09/11/2018    History of Present Illness Pt is a 68 y/o female admitted secondary to increased RLQ pain. 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 Pt with recent admission secondary to perforated appendicitis s/p drain placement.     PT Comments    Pt performed gt training progression with min guard to min assistance.  She did require moderate assistance to return to bed to lift B LEs.  Pt eager to improve and motivated.  Plan for post acute rehab before returning home.  Educated patient to ambulate 3x daily tolerance with nursing staff to improve strength and endurance.     Follow Up Recommendations  SNF     Equipment Recommendations  None recommended by PT    Recommendations for Other Services       Precautions / Restrictions Precautions Precautions: Fall Precaution Comments: abdominal wound Restrictions Weight Bearing Restrictions: No    Mobility  Bed Mobility Overal bed mobility: Needs Assistance Bed Mobility: Sit to Supine       Sit to supine: Mod assist   General bed mobility comments: HOB elevated and increased time, required lifting of B LEs for back to bed.  Transfers Overall transfer level: Needs assistance Equipment used: Rolling walker (2 wheeled) Transfers: Sit to/from Stand Sit to Stand: Min guard         General transfer comment: min guard A sit<>stand (increased time).  Cues for hand placement to and from seated surface.  Placed gt belt on with pillow over abdomen for comfort.  Ambulation/Gait Ambulation/Gait assistance: Min guard Gait Distance (Feet): 210 Feet Assistive device: Rolling walker (2 wheeled) Gait Pattern/deviations: Step-through pattern;Trunk flexed     General Gait Details: Slow guarded gt but motivated to progress.  Cues for upper  trunk control and RW safety.   Stairs             Wheelchair Mobility    Modified Rankin (Stroke Patients Only)       Balance Overall balance assessment: Needs assistance Sitting-balance support: No upper extremity supported;Feet supported Sitting balance-Leahy Scale: Good       Standing balance-Leahy Scale: Poor                              Cognition Arousal/Alertness: Awake/alert Behavior During Therapy: WFL for tasks assessed/performed Overall Cognitive Status: Within Functional Limits for tasks assessed                                        Exercises      General Comments        Pertinent Vitals/Pain Pain Assessment: 0-10 Pain Score: 6  Pain Location: Abdomen Pain Descriptors / Indicators: Grimacing;Guarding Pain Intervention(s): Monitored during session    Home Living                      Prior Function            PT Goals (current goals can now be found in the care plan section) Acute Rehab PT Goals Patient Stated Goal: to go to rehab then  home Potential to Achieve Goals: Good Progress towards PT goals: Progressing toward goals    Frequency    Min 2X/week  PT Plan Current plan remains appropriate    Co-evaluation              AM-PAC PT "6 Clicks" Mobility   Outcome Measure  Help needed turning from your back to your side while in a flat bed without using bedrails?: A Little Help needed moving from lying on your back to sitting on the side of a flat bed without using bedrails?: A Little Help needed moving to and from a bed to a chair (including a wheelchair)?: A Little Help needed standing up from a chair using your arms (e.g., wheelchair or bedside chair)?: A Little Help needed to walk in hospital room?: A Little Help needed climbing 3-5 steps with a railing? : A Little 6 Click Score: 18    End of Session Equipment Utilized During Treatment: Gait belt Activity Tolerance:  Patient limited by pain;Treatment limited secondary to medical complications (Comment) Patient left: in bed;with call bell/phone within reach Nurse Communication: Mobility status PT Visit Diagnosis: Other abnormalities of gait and mobility (R26.89);Muscle weakness (generalized) (M62.81);Difficulty in walking, not elsewhere classified (R26.2);Pain Pain - part of body: (abdomen)     Time: [id number] PT Time Calculation (min) (ACUTE ONLY): 30 min  Charges:  $Gait Training: 8-22 mins $Therapeutic Activity: 8-22 mins                     Joycelyn RuaAimee Royann Wildasin, PTA Acute Rehabilitation Services Pager [phone] Office [phone]     Damyiah Moxley Artis DelayJ Mekhi Sonn 09/11/2018, 5:23 PM

## 2018-09-12 LAB — CBC
HCT: 36 % (ref 36.0–46.0)
Hemoglobin: 11.2 g/dL — ABNORMAL LOW (ref 12.0–15.0)
MCH: 26.9 pg (ref 26.0–34.0)
MCHC: 31.1 g/dL (ref 30.0–36.0)
MCV: 86.5 fL (ref 80.0–100.0)
Platelets: 267 10*3/uL (ref 150–400)
RBC: 4.16 MIL/uL (ref 3.87–5.11)
RDW: 16.6 % — ABNORMAL HIGH (ref 11.5–15.5)
WBC: 11.4 10*3/uL — ABNORMAL HIGH (ref 4.0–10.5)
nRBC: 0 % (ref 0.0–0.2)

## 2018-09-12 LAB — BASIC METABOLIC PANEL
Anion gap: 10 (ref 5–15)
BUN: 7 mg/dL — ABNORMAL LOW (ref 8–23)
CO2: 23 mmol/L (ref 22–32)
Calcium: 8.6 mg/dL — ABNORMAL LOW (ref 8.9–10.3)
Chloride: 106 mmol/L (ref 98–111)
Creatinine, Ser: 0.8 mg/dL (ref 0.44–1.00)
GFR calc Af Amer: >60 mL/min (ref >60)
GFR calc non Af Amer: >60 mL/min (ref >60)
Glucose, Bld: 101 mg/dL — ABNORMAL HIGH (ref 70–99)
Potassium: 3.1 mmol/L — ABNORMAL LOW (ref 3.5–5.1)
Sodium: 139 mmol/L (ref 135–145)

## 2018-09-12 LAB — MAGNESIUM: Magnesium: 1.8 mg/dL (ref 1.7–2.4)

## 2018-09-12 MED ORDER — MAGNESIUM SULFATE IN D5W 1-5 GM/100ML-% IV SOLN
1.0000 g | Freq: Once | INTRAVENOUS | Status: AC
  Administered 2018-09-12: 10:00:00 1 g via INTRAVENOUS
  Filled 2018-09-12: qty 100

## 2018-09-12 MED ORDER — POTASSIUM CHLORIDE 10 MEQ/100ML IV SOLN
10.0000 meq | INTRAVENOUS | Status: AC
  Administered 2018-09-12 (×3): 10 meq via INTRAVENOUS
  Filled 2018-09-12 (×4): qty 100

## 2018-09-12 MED ORDER — PIPERACILLIN-TAZOBACTAM 3.375 G IVPB
3.3750 g | Freq: Three times a day (TID) | INTRAVENOUS | Status: DC
  Administered 2018-09-12 – 2018-09-17 (×15): 3.375 g via INTRAVENOUS
  Filled 2018-09-12 (×15): qty 50

## 2018-09-12 MED ORDER — ACETAMINOPHEN 10 MG/ML IV SOLN
1000.0000 mg | Freq: Four times a day (QID) | INTRAVENOUS | Status: AC
  Administered 2018-09-12 – 2018-09-13 (×4): 1000 mg via INTRAVENOUS
  Filled 2018-09-12 (×4): qty 100

## 2018-09-12 MED ORDER — POTASSIUM CHLORIDE 10 MEQ/100ML IV SOLN
10.0000 meq | Freq: Once | INTRAVENOUS | Status: AC
  Administered 2018-09-13: 10 meq via INTRAVENOUS
  Filled 2018-09-12: qty 100

## 2018-09-12 MED ORDER — POTASSIUM CHLORIDE 10 MEQ/100ML IV SOLN
10.0000 meq | INTRAVENOUS | Status: AC
  Administered 2018-09-12 (×2): 10 meq via INTRAVENOUS
  Filled 2018-09-12: qty 100

## 2018-09-12 NOTE — Progress Notes (Signed)
Physical Therapy Treatment Patient Details Name: Julie PedMargie Richards MRN: [id number] DOB: 21-Feb-1950 Today's Date: 09/12/2018    History of Present Illness Pt is a 68 y/o female admitted secondary to increased RLQ pain. 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 Pt with recent admission secondary to perforated appendicitis s/p drain placement.     PT Comments    Pt agreeable to mobilize with physical therapy now that pain is under control. During ambulation pt's became tachycardic and SOB requiring 2 standing rest brakes for vitals to return to therapeutic levels. After gait training pt requesting assist for toileting.  She had her first BM since surgery and required total assist for peri care. Will continue to follow acutely for mobility progression.    Follow Up Recommendations  SNF     Equipment Recommendations  None recommended by PT    Recommendations for Other Services       Precautions / Restrictions Precautions Precautions: Fall Precaution Comments: abdominal wound Restrictions Weight Bearing Restrictions: No    Mobility  Bed Mobility               General bed mobility comments: in chair on arrival  Transfers Overall transfer level: Needs assistance Equipment used: Rolling walker (2 wheeled) Transfers: Sit to/from Stand Sit to Stand: Min guard Stand pivot transfers: Min guard       General transfer comment: min guard for safety and line management.  Ambulation/Gait Ambulation/Gait assistance: Min guard Gait Distance (Feet): 200 Feet Assistive device: Rolling walker (2 wheeled) Gait Pattern/deviations: Step-through pattern;Trunk flexed     General Gait Details: Pt with slow guarded gait secondary to post op pain. HR reaching 130 and RR reaching 40, requiring two standing rest brakes to return HR and RR to normal.   Stairs             Wheelchair Mobility     Modified Rankin (Stroke Patients Only)       Balance Overall balance assessment: Needs assistance Sitting-balance support: No upper extremity supported;Feet supported Sitting balance-Leahy Scale: Good     Standing balance support: Single extremity supported Standing balance-Leahy Scale: Poor                              Cognition Arousal/Alertness: Awake/alert Behavior During Therapy: WFL for tasks assessed/performed Overall Cognitive Status: Within Functional Limits for tasks assessed                                        Exercises      General Comments General comments (skin integrity, edema, etc.): Placed pillow between gait belt and abdomen for comfort      Pertinent Vitals/Pain Pain Assessment: Faces Faces Pain Scale: Hurts little more Pain Location: Abdomen Pain Descriptors / Indicators: Grimacing;Guarding Pain Intervention(s): Monitored during session;Limited activity within patient's tolerance    Home Living                      Prior Function            PT Goals (current goals can now be found in the care plan section) Acute Rehab PT Goals Patient Stated Goal: to go to rehab then  home PT Goal Formulation: With patient Time For Goal Achievement: 09/24/18 Potential to Achieve Goals: Good Progress towards PT  goals: Progressing toward goals    Frequency    Min 2X/week      PT Plan Current plan remains appropriate    Co-evaluation              AM-PAC PT "6 Clicks" Mobility   Outcome Measure  Help needed turning from your back to your side while in a flat bed without using bedrails?: A Little Help needed moving from lying on your back to sitting on the side of a flat bed without using bedrails?: A Little Help needed moving to and from a bed to a chair (including a wheelchair)?: A Little Help needed standing up from a chair using your arms (e.g., wheelchair or bedside chair)?: A Little Help needed  to walk in hospital room?: A Little Help needed climbing 3-5 steps with a railing? : A Little 6 Click Score: 18    End of Session Equipment Utilized During Treatment: Gait belt Activity Tolerance: Patient tolerated treatment well Patient left: with call bell/phone within reach;in chair Nurse Communication: Mobility status(pt had BM) PT Visit Diagnosis: Other abnormalities of gait and mobility (R26.89);Muscle weakness (generalized) (M62.81);Difficulty in walking, not elsewhere classified (R26.2);Pain Pain - part of body: (abdomen)     Time: 4431-5400 PT Time Calculation (min) (ACUTE ONLY): 33 min  Charges:  $Gait Training: 8-22 mins $Therapeutic Activity: 8-22 mins                     Benjiman Core, [place] Pager [phone] Acute Rehab  Allena Katz 09/12/2018, 4:12 PM

## 2018-09-12 NOTE — Plan of Care (Signed)

## 2018-09-12 NOTE — Progress Notes (Signed)
3 Days Post-Op  Subjective: CC: Abdominal pain Abdominal pain controlled with PCA but still complains of quite a bit of pain with dressing changes. Has occasional nausea but no emesis. No flatus or BM. Does not feel bloated. Walked in the halls twice yesterday and is working with therapies. Will try to spend some time in the chair this afternoon. Using IS.   Objective: Vital signs in last 24 hours: Temp:  [98.9 F (37.2 C)-99.2 F (37.3 C)] 99.2 F (37.3 C) (08/22 0538) Pulse Rate:  [92-95] 95 (08/22 0538) Resp:  [16-25] 16 (08/22 0538) BP: (140-145)/(83-84) 145/83 (08/22 0538) SpO2:  [95 %-99 %] 97 % (08/22 0538) FiO2 (%):  [99 %] 99 % (08/22 0454) Last BM Date: 09/03/18  Intake/Output from previous day: 08/21 0701 - 08/22 0700 In: 443.9 [I.V.:2.5; IV Piggyback:441.4] Out: 300 [Urine:300] Intake/Output this shift: No intake/output data recorded.  PE: Gen: Alert, NAD Heart: regular Lungs: CTA b/l. Ext:no BLE edema Psych: A&Ox3  Skin: no rashes noted, warm and dry WUJ:WJXB,JYNW distension, +BS, mild diffuse tenderness, no peritonitis, lap incisions cdi with staples intact, open midline wound clean/ no purulent drainage or cellulitis/ trace bloody oozing from area just proximal to umbilicus     Lab Results:  Recent Labs    09/10/18 1028 09/12/18 0444  WBC 15.2* 11.4*  HGB 11.9* 11.2*  HCT 39.6 36.0  PLT 323 267   BMET Recent Labs    09/10/18 1617 09/12/18 0444  NA 139 139  K 4.1 3.1*  CL 107 106  CO2 22 23  GLUCOSE 138* 101*  BUN 13 7*  CREATININE 1.03* 0.80  CALCIUM 8.7* 8.6*   PT/INR No results for input(s): LABPROT, INR in the last 72 hours. CMP     Component Value Date/Time   NA 139 09/12/2018 0444   K 3.1 (L) 09/12/2018 0444   CL 106 09/12/2018 0444   CO2 23 09/12/2018 0444   GLUCOSE 101 (H) 09/12/2018 0444   BUN 7 (L) 09/12/2018 0444   CREATININE 0.80 09/12/2018 0444   CALCIUM 8.6 (L) 09/12/2018 0444   PROT 7.1 08/25/2018 1329    ALBUMIN 3.3 (L) 08/25/2018 1329   AST 18 08/25/2018 1329   ALT 24 08/25/2018 1329   ALKPHOS 88 08/25/2018 1329   BILITOT 1.6 (H) 08/25/2018 1329   GFRNONAA >60 09/12/2018 0444   GFRAA >60 09/12/2018 0444   Lipase     Component Value Date/Time   LIPASE 24 08/25/2018 1329       Studies/Results: No results found.  Anti-infectives: Anti-infectives (From admission, onward)   Start     Dose/Rate Route Frequency Ordered Stop   09/08/18 1400  neomycin (MYCIFRADIN) tablet 1,000 mg     1,000 mg Oral 3 times per day 09/08/18 0812 09/08/18 2145   09/08/18 1400  metroNIDAZOLE (FLAGYL) tablet 1,000 mg     1,000 mg Oral 3 times per day 09/08/18 0812 09/08/18 2145   08/28/18 0000  amoxicillin-clavulanate (AUGMENTIN) 875-125 MG tablet     1 tablet Oral 2 times daily 08/28/18 1530 09/11/18 2359   08/26/18 0600  piperacillin-tazobactam (ZOSYN) IVPB 3.375 g     3.375 g 12.5 mL/hr over 240 Minutes Intravenous Every 8 hours 08/25/18 2141     08/25/18 2115  ceFEPIme (MAXIPIME) 2 g in sodium chloride 0.9 % 100 mL IVPB     2 g 200 mL/hr over 30 Minutes Intravenous  Once 08/25/18 2102 08/25/18 2229   08/25/18 2115  metroNIDAZOLE (FLAGYL) IVPB  500 mg     500 mg 100 mL/hr over 60 Minutes Intravenous  Once 08/25/18 2102 08/25/18 2257       Assessment/Plan Hx of gastric banding- [place] ago in [place] as part of a study. Lap band noted to slip inferiorly on stomach on CT. Reviewed with Dr. Andrey CampanileWilson who advised follow up as this could put her at risk for ulcerations and perforation.  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 Luisa Hartornett - POD#3 - Surgical pathology: PERICECAL ABSCESS WITH GRANULATION TISSUE AND FIBROSIS CONSISTENT WITH PERFORATION; ONE BENIGN REACTIVE LYMPH NODE; NO EVIDENCE OF MALIGNANCY - BID WTD dressing changes to  midline wound - No flatus or BM. Await bowel function.   FEN -IVF,NPO except ice chips. Replace K VTE -SCDs, lovenox ID -Zosyn 8/5>> (will need through 8/28) Foley -d/c 8/20 Follow up - Dr. Luisa Hartornett  Plan: Continue just ice chips and await return in bowel function. Mobilize. Continue therapies, currently recommending SNF. Continue PCA and scheduled IV tylenol and robaxin. Continue abx as above. Repeat labs. WBC improving.    LOS: 17 days    Jacinto HalimMichael M Janay Canan , [facility] 09/12/2018, 8:45 AM Pager: [phone]

## 2018-09-12 NOTE — Progress Notes (Signed)
PT Cancellation Note  Patient Details Name: [name] MRN: [id number] DOB: [birth year]   Cancelled Treatment:    Reason Eval/Treat Not Completed: Pain limiting ability to participate. Two attempts to see this AM. On first attempt pt reported abdominal pain was too significant to participate with therapy and requested PTA return later. On second attempt RN giving pt K+ and pt in pain from infusion. RN requesting PTA attempt back later. Will try again as time allows.   Benjiman Core, PTA Pager [phone] Acute Rehab   Allena Katz 09/12/2018, 11:26 AM

## 2018-09-13 LAB — CBC
HCT: 31.8 % — ABNORMAL LOW (ref 36.0–46.0)
Hemoglobin: 10 g/dL — ABNORMAL LOW (ref 12.0–15.0)
MCH: 26.8 pg (ref 26.0–34.0)
MCHC: 31.4 g/dL (ref 30.0–36.0)
MCV: 85.3 fL (ref 80.0–100.0)
Platelets: 246 10*3/uL (ref 150–400)
RBC: 3.73 MIL/uL — ABNORMAL LOW (ref 3.87–5.11)
RDW: 16.4 % — ABNORMAL HIGH (ref 11.5–15.5)
WBC: 8.3 10*3/uL (ref 4.0–10.5)
nRBC: 0 % (ref 0.0–0.2)

## 2018-09-13 LAB — BASIC METABOLIC PANEL
Anion gap: 12 (ref 5–15)
BUN: 5 mg/dL — ABNORMAL LOW (ref 8–23)
CO2: 21 mmol/L — ABNORMAL LOW (ref 22–32)
Calcium: 8.5 mg/dL — ABNORMAL LOW (ref 8.9–10.3)
Chloride: 105 mmol/L (ref 98–111)
Creatinine, Ser: 0.71 mg/dL (ref 0.44–1.00)
GFR calc Af Amer: >60 mL/min (ref >60)
GFR calc non Af Amer: >60 mL/min (ref >60)
Glucose, Bld: 80 mg/dL (ref 70–99)
Potassium: 3.3 mmol/L — ABNORMAL LOW (ref 3.5–5.1)
Sodium: 138 mmol/L (ref 135–145)

## 2018-09-13 LAB — MAGNESIUM: Magnesium: 1.8 mg/dL (ref 1.7–2.4)

## 2018-09-13 MED ORDER — HYDROMORPHONE HCL 2 MG PO TABS
2.0000 mg | ORAL_TABLET | ORAL | Status: DC | PRN
  Administered 2018-09-15 – 2018-09-16 (×3): 2 mg via ORAL
  Filled 2018-09-13 (×5): qty 1

## 2018-09-13 MED ORDER — GLUCERNA SHAKE PO LIQD
237.0000 mL | Freq: Three times a day (TID) | ORAL | Status: DC
  Administered 2018-09-13 – 2018-09-16 (×5): 237 mL via ORAL

## 2018-09-13 MED ORDER — KETOROLAC TROMETHAMINE 30 MG/ML IJ SOLN
30.0000 mg | Freq: Three times a day (TID) | INTRAMUSCULAR | Status: DC | PRN
  Administered 2018-09-15 – 2018-09-16 (×3): 30 mg via INTRAVENOUS
  Filled 2018-09-13 (×3): qty 1

## 2018-09-13 NOTE — Progress Notes (Signed)
Discontinued PCA per order  Wasted 22 mg / ml of dilaudid  Witness Iona Beard CN  Educated pt on alternative pain management methods, pt verbalizes understanding  Assisted in ordering pt breakfast  Will continue to monitor

## 2018-09-13 NOTE — Plan of Care (Signed)

## 2018-09-13 NOTE — Progress Notes (Signed)
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 Brantley Stage - POD#4 - Surgical pathology:PERICECAL ABSCESS WITH GRANULATION TISSUE AND FIBROSIS CONSISTENT WITH PERFORATION;ONE BENIGN REACTIVE LYMPH NODE;NO EVIDENCE OF MALIGNANCY - BID WTD dressing changes to midline wound - dc pca, add oral dilaudid and IV toradol  FEN -IVF,full liquids VTE -SCDs, lovenox ID -Zosyn 8/5>> (will need through 8/28) Foley -d/c 8/20 Follow up - Dr. Brantley Stage  Plan:dc pca, advance diet, add protein supplement, continue to ambulate/work with PC   LOS: 18 days  Chief Complaint/Subjective: Pain moderate over right side, +BM, no nausea  Objective: Vital signs in last 24 hours: Temp:  [98.3 F (36.8 C)-99.1 F (37.3 C)] 98.3 F (36.8 C) (08/23 0822) Pulse Rate:  [72-88] 72 (08/23 0822) Resp:  [15-20] 15 (08/23 0822) BP: (133-155)/(70-101) 133/86 (08/23 0822) SpO2:  [95 %-100 %] 95 % (08/23 0822) FiO2 (%):  [86 %-100 %] 96 % (08/23 0736) Last BM Date: 09/12/18  Intake/Output from previous day: 08/22 0701 - 08/23 0700 In: 1199.5 [I.V.:478.1; IV Piggyback:721.4] Out: -  Intake/Output this shift: No intake/output data recorded.  Lungs: nonlabored  Cardiovascular: RRR  Abd: soft, open midline incision with clean base  Extremities: no edema  Neuro: AOx4  Lab Results: CBC  Recent Labs    09/12/18 0444 09/13/18 0857  WBC 11.4* 8.3  HGB 11.2* 10.0*  HCT 36.0 31.8*  PLT 267 246   BMET Recent Labs    09/12/18 0444 09/13/18 0857  NA 139 138  K 3.1* 3.3*  CL 106 105  CO2 23 21*  GLUCOSE 101* 80  BUN 7* 5*  CREATININE 0.80 0.71  CALCIUM 8.6* 8.5*   PT/INR No results for input(s): LABPROT,  INR in the last 72 hours. ABG No results for input(s): PHART, HCO3 in the last 72 hours.  Invalid input(s): PCO2, PO2  Studies/Results:  Anti-infectives: Anti-infectives (From admission, onward)   Start     Dose/Rate Route Frequency Ordered Stop   09/12/18 1800  piperacillin-tazobactam (ZOSYN) IVPB 3.375 g     3.375 g 12.5 mL/hr over 240 Minutes Intravenous Every 8 hours 09/12/18 1457     09/08/18 1400  neomycin (MYCIFRADIN) tablet 1,000 mg     1,000 mg Oral 3 times per day 09/08/18 0812 09/08/18 2145   09/08/18 1400  metroNIDAZOLE (FLAGYL) tablet 1,000 mg     1,000 mg Oral 3 times per day 09/08/18 0812 09/08/18 2145   08/28/18 0000  amoxicillin-clavulanate (AUGMENTIN) 875-125 MG tablet     1 tablet Oral 2 times daily 08/28/18 1530 09/11/18 2359   08/26/18 0600  piperacillin-tazobactam (ZOSYN) IVPB 3.375 g  Status:  Discontinued     3.375 g 12.5 mL/hr over 240 Minutes Intravenous Every 8 hours 08/25/18 2141 09/12/18 1457   08/25/18 2115  ceFEPIme (MAXIPIME) 2 g in sodium chloride 0.9 % 100 mL IVPB     2 g 200 mL/hr over 30 Minutes Intravenous  Once 08/25/18 2102 08/25/18 2229   08/25/18 2115  metroNIDAZOLE (FLAGYL) IVPB 500 mg     500 mg 100 mL/hr over 60 Minutes Intravenous  Once 08/25/18 2102 08/25/18 2257      Medications: Scheduled Meds: . docusate sodium  100 mg Oral BID  . enoxaparin (LOVENOX) injection  40 mg Subcutaneous Daily  . feeding supplement  1 Container Oral BID BM  .  feeding supplement (ENSURE ENLIVE)  237 mL Oral BID BM  . feeding supplement (GLUCERNA SHAKE)  237 mL Oral TID BM  . methocarbamol  750 mg Oral Q8H  . sodium chloride flush  10-40 mL Intracatheter Q12H  . sodium chloride flush  5 mL Intracatheter Q8H   Continuous Infusions: . sodium chloride 75 mL/hr at 09/12/18 2215  . lactated ringers    . piperacillin-tazobactam (ZOSYN)  IV 3.375 g (09/13/18 0902)   PRN Meds:.acetaminophen **OR** acetaminophen, bisacodyl, diphenhydrAMINE **OR**  diphenhydrAMINE, hydrALAZINE, HYDROmorphone (DILAUDID) injection, HYDROmorphone, ketorolac, metoprolol tartrate, ondansetron **OR** ondansetron (ZOFRAN) IV, polyethylene glycol, sodium chloride flush, traMADol  Rodman PickleLuke Aaron Alora Gorey, MD [facility]

## 2018-09-14 MED ORDER — POTASSIUM CHLORIDE 10 MEQ/50ML IV SOLN
10.0000 meq | INTRAVENOUS | Status: AC
  Administered 2018-09-14 (×4): 10 meq via INTRAVENOUS
  Filled 2018-09-14 (×4): qty 50

## 2018-09-14 MED ORDER — MAGNESIUM SULFATE 2 GM/50ML IV SOLN
2.0000 g | Freq: Once | INTRAVENOUS | Status: AC
  Administered 2018-09-14: 2 g via INTRAVENOUS
  Filled 2018-09-14: qty 50

## 2018-09-14 MED ORDER — METHOCARBAMOL 500 MG PO TABS
750.0000 mg | ORAL_TABLET | Freq: Three times a day (TID) | ORAL | Status: DC | PRN
  Administered 2018-09-15 – 2018-09-16 (×3): 750 mg via ORAL
  Filled 2018-09-14 (×4): qty 2

## 2018-09-14 NOTE — Care Management Important Message (Signed)
Important Message  Patient Details  Name: Julie Richards MRN: [id number] Date of Birth: [birth year]   Medicare Important Message Given:  Yes     Memory Argue 09/14/2018, 2:50 PM

## 2018-09-14 NOTE — Progress Notes (Signed)
Physical Therapy Treatment Patient Details Name: Julie Richards MRN: [id number] DOB: October 31, 1950 Today's Date: 09/14/2018    History of Present Illness Pt is a 68 y/o female admitted secondary to increased RLQ pain. 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 Pt with recent admission secondary to perforated appendicitis s/p drain placement.     PT Comments    Pt performed transfer training and gt training with supervision for all aspects.  She was able to move away from RW today but halfway through trial patient fatigued and required IV pole for unilateral HHA.  Pt is progression well.  She continues to benefit from SNF placement to improve strength and function before returning home to private residence.  Her largest limiting factors are pain and endurance.     Follow Up Recommendations  SNF     Equipment Recommendations  None recommended by PT    Recommendations for Other Services       Precautions / Restrictions Precautions Precautions: Fall Precaution Comments: abdominal wound Restrictions Weight Bearing Restrictions: No    Mobility  Bed Mobility Overal bed mobility: Needs Assistance Bed Mobility: Supine to Sit Rolling: Supervision   Supine to sit: Supervision;HOB elevated     General bed mobility comments: Pt able to perform without assistance and supervision for technique only.  Transfers Overall transfer level: Needs assistance Equipment used: None Transfers: Sit to/from Stand Sit to Stand: Supervision         General transfer comment: Cues for hand placement and safety.  Pt able to come to standing without physical assistance.  Ambulation/Gait Ambulation/Gait assistance: Supervision Gait Distance (Feet): 500 Feet Assistive device: Rolling walker (2 wheeled) Gait Pattern/deviations: Step-through pattern;Trunk flexed     General Gait Details: Pt remains slow and  guarded and half way through trial she required use of IV poie for unilateral support.   Stairs             Wheelchair Mobility    Modified Rankin (Stroke Patients Only)       Balance Overall balance assessment: Needs assistance Sitting-balance support: No upper extremity supported;Feet supported Sitting balance-Leahy Scale: Good       Standing balance-Leahy Scale: Fair                              Cognition Arousal/Alertness: Awake/alert Behavior During Therapy: WFL for tasks assessed/performed Overall Cognitive Status: Within Functional Limits for tasks assessed                                        Exercises      General Comments        Pertinent Vitals/Pain Pain Assessment: Faces Faces Pain Scale: Hurts even more Pain Location: Abdomen, and LUE site at arm Pain Descriptors / Indicators: Grimacing;Guarding Pain Intervention(s): Monitored during session;Repositioned    Home Living                      Prior Function            PT Goals (current goals can now be found in the care plan section) Acute Rehab PT Goals Patient Stated Goal: to go to rehab then  home Potential to Achieve Goals: Good Progress towards PT goals: Progressing toward goals    Frequency    Min 2X/week  PT Plan Current plan remains appropriate    Co-evaluation              AM-PAC PT "6 Clicks" Mobility   Outcome Measure  Help needed turning from your back to your side while in a flat bed without using bedrails?: A Little Help needed moving from lying on your back to sitting on the side of a flat bed without using bedrails?: A Little Help needed moving to and from a bed to a chair (including a wheelchair)?: A Little Help needed standing up from a chair using your arms (e.g., wheelchair or bedside chair)?: A Little Help needed to walk in hospital room?: A Little Help needed climbing 3-5 steps with a railing? : A Little 6  Click Score: 18    End of Session Equipment Utilized During Treatment: Gait belt Activity Tolerance: Patient tolerated treatment well Patient left: with call bell/phone within reach;in chair Nurse Communication: Mobility status PT Visit Diagnosis: Other abnormalities of gait and mobility (R26.89);Muscle weakness (generalized) (M62.81);Difficulty in walking, not elsewhere classified (R26.2);Pain Pain - part of body: (abdomen)     Time: 1539-1600 PT Time Calculation (min) (ACUTE ONLY): 21 min  Charges:  $Gait Training: 8-22 mins                     Julie Richards, PTA Acute Rehabilitation Services Pager [phone] Office [phone]     Julie Sturtevant Artis DelayJ Phil Richards 09/14/2018, 4:11 PM

## 2018-09-14 NOTE — Progress Notes (Signed)
5 Days Post-Op   Subjective/Chief Complaint: Pain control is OK. No nausea but feels bloated with meals. Continues to have loose bowel movements, flatus x 1.   Objective: Vital signs in last 24 hours: Temp:  [98.1 F (36.7 C)-98.7 F (37.1 C)] 98.3 F (36.8 C) (08/24 0522) Pulse Rate:  [72-89] 84 (08/24 0522) Resp:  [15-19] 19 (08/24 0522) BP: (133-146)/(78-86) 145/86 (08/24 0522) SpO2:  [95 %-100 %] 98 % (08/24 0522) Last BM Date: 09/13/18  Intake/Output from previous day: 08/23 0701 - 08/24 0700 In: 2015.3 [P.O.:840; I.V.:1025.8; IV Piggyback:149.4] Out: -  Intake/Output this shift: No intake/output data recorded.  General appearance: alert and cooperative Resp: clear to auscultation bilaterally GI: soft, mildly distended, mildly/ appropriately tender. midline wound clean without infection Extremities: extremities normal, atraumatic, no cyanosis or edema Pulses: 2+ and symmetric Skin: Skin color, texture, turgor normal. No rashes or lesions Neurologic: Grossly normal  Lab Results:  Recent Labs    09/12/18 0444 09/13/18 0857  WBC 11.4* 8.3  HGB 11.2* 10.0*  HCT 36.0 31.8*  PLT 267 246   BMET Recent Labs    09/12/18 0444 09/13/18 0857  NA 139 138  K 3.1* 3.3*  CL 106 105  CO2 23 21*  GLUCOSE 101* 80  BUN 7* 5*  CREATININE 0.80 0.71  CALCIUM 8.6* 8.5*   PT/INR No results for input(s): LABPROT, INR in the last 72 hours. ABG No results for input(s): PHART, HCO3 in the last 72 hours.  Invalid input(s): PCO2, PO2  Studies/Results: No results found.  Anti-infectives: Anti-infectives (From admission, onward)   Start     Dose/Rate Route Frequency Ordered Stop   09/12/18 1800  piperacillin-tazobactam (ZOSYN) IVPB 3.375 g     3.375 g 12.5 mL/hr over 240 Minutes Intravenous Every 8 hours 09/12/18 1457     09/08/18 1400  neomycin (MYCIFRADIN) tablet 1,000 mg     1,000 mg Oral 3 times per day 09/08/18 0812 09/08/18 2145   09/08/18 1400  metroNIDAZOLE  (FLAGYL) tablet 1,000 mg     1,000 mg Oral 3 times per day 09/08/18 1610 09/08/18 2145   08/28/18 0000  amoxicillin-clavulanate (AUGMENTIN) 875-125 MG tablet     1 tablet Oral 2 times daily 08/28/18 1530 09/11/18 2359   08/26/18 0600  piperacillin-tazobactam (ZOSYN) IVPB 3.375 g  Status:  Discontinued     3.375 g 12.5 mL/hr over 240 Minutes Intravenous Every 8 hours 08/25/18 2141 09/12/18 1457   08/25/18 2115  ceFEPIme (MAXIPIME) 2 g in sodium chloride 0.9 % 100 mL IVPB     2 g 200 mL/hr over 30 Minutes Intravenous  Once 08/25/18 2102 08/25/18 2229   08/25/18 2115  metroNIDAZOLE (FLAGYL) IVPB 500 mg     500 mg 100 mL/hr over 60 Minutes Intravenous  Once 08/25/18 2102 08/25/18 2257      Assessment/Plan:  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 Brantley Stage -Surgical pathology:PERICECAL ABSCESS WITH GRANULATION TISSUE AND FIBROSIS CONSISTENT WITH PERFORATION;ONE BENIGN REACTIVE LYMPH NODE;NO EVIDENCE OF MALIGNANCY -BID WTDdressing changes to midline wound - continue multimodal pain control - continue fulls, has mild expected post-op ileus  FEN -IVF,full liquids. Hypokalemia 3.3 VTE -SCDs, lovenox ID -Zosyn 8/5>>(will need through 8/28) Foley -d/c 8/20 Follow up - Dr. Brantley Stage  Plan:replace k/mg, recheck labs tomorrow, continue mobilizing and pulm toilet   LOS: 19 days    Julie Richards 09/14/2018

## 2018-09-14 NOTE — Progress Notes (Signed)
Spoke with RN [place], patient has had 2 midlines and 2 PIV which have all stopped working. Patient at this time needs a PICC line or a central line placed to get the remaining IV medications needed. RN stated she would call and get orders

## 2018-09-15 LAB — BASIC METABOLIC PANEL
Anion gap: 8 (ref 5–15)
BUN: 6 mg/dL — ABNORMAL LOW (ref 8–23)
CO2: 26 mmol/L (ref 22–32)
Calcium: 8.8 mg/dL — ABNORMAL LOW (ref 8.9–10.3)
Chloride: 103 mmol/L (ref 98–111)
Creatinine, Ser: 0.88 mg/dL (ref 0.44–1.00)
GFR calc Af Amer: >60 mL/min (ref >60)
GFR calc non Af Amer: >60 mL/min (ref >60)
Glucose, Bld: 128 mg/dL — ABNORMAL HIGH (ref 70–99)
Potassium: 3.4 mmol/L — ABNORMAL LOW (ref 3.5–5.1)
Sodium: 137 mmol/L (ref 135–145)

## 2018-09-15 LAB — MAGNESIUM: Magnesium: 2 mg/dL (ref 1.7–2.4)

## 2018-09-15 LAB — NOVEL CORONAVIRUS, NAA (HOSP ORDER, SEND-OUT TO REF LAB; TAT 18-24 HRS)

## 2018-09-15 MED ORDER — DOCUSATE SODIUM 100 MG PO CAPS: 100.0000 mg | ORAL_CAPSULE | Freq: Every day | ORAL | Status: DC | PRN

## 2018-09-15 MED ORDER — POTASSIUM CHLORIDE CRYS ER 20 MEQ PO TBCR
20.0000 meq | EXTENDED_RELEASE_TABLET | Freq: Three times a day (TID) | ORAL | Status: AC
  Administered 2018-09-15 (×3): 20 meq via ORAL
  Filled 2018-09-15 (×3): qty 1

## 2018-09-15 NOTE — Plan of Care (Signed)

## 2018-09-15 NOTE — Progress Notes (Signed)
[facility] Progress Note  6 Days Post-Op  Subjective: CC: pain with dressing changes Patient reports pain with dressing changes and just feeling tired of all this. Tolerating FLD, denies nausea. Some bloating. Having loose BMs. Patient asking questions about when she will be transferred to rehab facility  Objective: Vital signs in last 24 hours: Temp:  [98 F (36.7 C)-98.6 F (37 C)] 98.6 F (37 C) (08/25 0824) Pulse Rate:  [69-91] 75 (08/25 0824) Resp:  [14-19] 14 (08/25 0824) BP: (118-145)/(66-76) 145/76 (08/25 0824) SpO2:  [97 %-99 %] 99 % (08/25 0824) Last BM Date: 09/13/18  Intake/Output from previous day: 08/24 0701 - 08/25 0700 In: 1799.9 [P.O.:1080; I.V.:249.4; IV Piggyback:470.5] Out: -  Intake/Output this shift: Total I/O In: 360 [P.O.:360] Out: -   PE: Gen:  Alert, NAD, pleasant Card:  Regular rate and rhythm, pedal pulses 2+ BL Pulm:  Normal effort, clear to auscultation bilaterally Abd: Soft, mildly diffusely tender, non-distended, +BS, dressing to midline c/d/i Skin: warm and dry, no rashes  Psych: A&Ox3   Lab Results:  Recent Labs    09/13/18 0857  WBC 8.3  HGB 10.0*  HCT 31.8*  PLT 246   BMET Recent Labs    09/13/18 0857  NA 138  K 3.3*  CL 105  CO2 21*  GLUCOSE 80  BUN 5*  CREATININE 0.71  CALCIUM 8.5*   PT/INR No results for input(s): LABPROT, INR in the last 72 hours. CMP     Component Value Date/Time   NA 138 09/13/2018 0857   K 3.3 (L) 09/13/2018 0857   CL 105 09/13/2018 0857   CO2 21 (L) 09/13/2018 0857   GLUCOSE 80 09/13/2018 0857   BUN 5 (L) 09/13/2018 0857   CREATININE 0.71 09/13/2018 0857   CALCIUM 8.5 (L) 09/13/2018 0857   PROT 7.1 08/25/2018 1329   ALBUMIN 3.3 (L) 08/25/2018 1329   AST 18 08/25/2018 1329   ALT 24 08/25/2018 1329   ALKPHOS 88 08/25/2018 1329   BILITOT 1.6 (H) 08/25/2018 1329   GFRNONAA >60 09/13/2018 0857   GFRAA >60 09/13/2018 0857   Lipase     Component Value Date/Time    LIPASE 24 08/25/2018 1329       Studies/Results: No results found.  Anti-infectives: Anti-infectives (From admission, onward)   Start     Dose/Rate Route Frequency Ordered Stop   09/12/18 1800  piperacillin-tazobactam (ZOSYN) IVPB 3.375 g     3.375 g 12.5 mL/hr over 240 Minutes Intravenous Every 8 hours 09/12/18 1457     09/08/18 1400  neomycin (MYCIFRADIN) tablet 1,000 mg     1,000 mg Oral 3 times per day 09/08/18 0812 09/08/18 2145   09/08/18 1400  metroNIDAZOLE (FLAGYL) tablet 1,000 mg     1,000 mg Oral 3 times per day 09/08/18 0812 09/08/18 2145   08/28/18 0000  amoxicillin-clavulanate (AUGMENTIN) 875-125 MG tablet     1 tablet Oral 2 times daily 08/28/18 1530 09/11/18 2359   08/26/18 0600  piperacillin-tazobactam (ZOSYN) IVPB 3.375 g  Status:  Discontinued     3.375 g 12.5 mL/hr over 240 Minutes Intravenous Every 8 hours 08/25/18 2141 09/12/18 1457   08/25/18 2115  ceFEPIme (MAXIPIME) 2 g in sodium chloride 0.9 % 100 mL IVPB     2 g 200 mL/hr over 30 Minutes Intravenous  Once 08/25/18 2102 08/25/18 2229   08/25/18 2115  metroNIDAZOLE (FLAGYL) IVPB 500 mg     500 mg 100 mL/hr over 60 Minutes Intravenous  Once 08/25/18 2102 08/25/18 2257       Assessment/Plan 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 Luisa Hartornett -Surgical pathology:PERICECAL ABSCESS WITH GRANULATION TISSUE AND FIBROSIS CONSISTENT WITH PERFORATION;ONE BENIGN REACTIVE LYMPH NODE;NO EVIDENCE OF MALIGNANCY -BID WTDdressing changes to midline wound - continue multimodal pain control - advance to soft diet   FEN -soft diet VTE -SCDs, lovenox ID -Zosyn 8/5>>(will need through 8/28) Foley -d/c 8/20 Follow up - Dr. Luisa Hartornett  Plan:repeat labs, advance diet. Working toward SNF placement  LOS: 20 days    Wells GuilesKelly Rayburn , [facility] 09/15/2018, 9:12 AM Pager:  [phone] Consults: [phone] 7:00 AM - 4:30 PM M, W-F 7:00 AM - 11:30 AM Tues, Sat, Sun

## 2018-09-15 NOTE — Plan of Care (Signed)

## 2018-09-16 LAB — BASIC METABOLIC PANEL
Anion gap: 9 (ref 5–15)
BUN: 5 mg/dL — ABNORMAL LOW (ref 8–23)
CO2: 23 mmol/L (ref 22–32)
Calcium: 8.9 mg/dL (ref 8.9–10.3)
Chloride: 107 mmol/L (ref 98–111)
Creatinine, Ser: 0.7 mg/dL (ref 0.44–1.00)
GFR calc Af Amer: >60 mL/min (ref >60)
GFR calc non Af Amer: >60 mL/min (ref >60)
Glucose, Bld: 88 mg/dL (ref 70–99)
Potassium: 3.6 mmol/L (ref 3.5–5.1)
Sodium: 139 mmol/L (ref 135–145)

## 2018-09-16 LAB — SARS CORONAVIRUS 2 (TAT 6-24 HRS): SARS Coronavirus 2: NEGATIVE

## 2018-09-16 MED ORDER — TRAMADOL HCL 50 MG PO TABS: 50.0000 mg | ORAL_TABLET | Freq: Four times a day (QID) | ORAL | 0 refills | Status: DC | PRN

## 2018-09-16 MED ORDER — ACETAMINOPHEN 325 MG PO TABS: 650.0000 mg | ORAL_TABLET | Freq: Four times a day (QID) | ORAL | Status: AC | PRN

## 2018-09-16 MED ORDER — METHOCARBAMOL 750 MG PO TABS: 750.0000 mg | ORAL_TABLET | Freq: Three times a day (TID) | ORAL | Status: DC | PRN

## 2018-09-16 NOTE — Progress Notes (Signed)
Nutrition Follow-up  DOCUMENTATION CODES:   Morbid obesity  INTERVENTION:  Continue Ensure Enlive po BID, each supplement provides 350 kcal and 20 grams of protein  Continue Boost Breeze po BID, each supplement provides 250 kcal and 9 grams of protein  Discontinue Glucerna shake due to poor tolerance.  NUTRITION DIAGNOSIS:   Increased nutrient needs related to acute illness(abdominal abscess) as evidenced by estimated needs; ongoing  GOAL:   Patient will meet greater than or equal to 90% of their needs; met  MONITOR:   PO intake, Supplement acceptance, Skin, Weight trends, Labs, I & O's  REASON FOR ASSESSMENT:   Consult Assessment of nutrition requirement/status  ASSESSMENT:   68 year old woman who was just discharged from our service 1 month ago with a percutaneous drain in place for treatment of perforated appendicitis presents with abdominal pain. Fistula to cecum. 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 Meal completion has been 50-75%. Tolerating diet. Pt currently has Ensure, Prostat, and Glucerna ordered. Pt reports Glucerna causes abdominal discomfort and diarrhea. RD to discontinue Glucerna. Pt medically stable per MD. SNF placement pending.   Labs and medications reviewed.   Diet Order:   Diet Order            DIET SOFT Room service appropriate? Yes; Fluid consistency: Thin  Diet effective now              EDUCATION NEEDS:   Education needs have been addressed  Skin:  Skin Assessment: Skin Integrity Issues: Skin Integrity Issues:: Incisions Incisions: abdomen  Last BM:  8/25  Height:   Ht Readings from Last 1 Encounters:  09/09/18 5' 9.02" (1.753 m)    Weight:   Wt Readings from Last 1 Encounters:  09/09/18 135 kg    Ideal Body Weight:  65.9 kg  BMI:  Body mass index is 43.93  kg/m.  Estimated Nutritional Needs:   Kcal:  1850-2050  Protein:  100-110 grams  Fluid:  1.8 -2 L/day    Corrin Parker, MS, RD, LDN Pager # [phone] After hours/ weekend pager # [phone]

## 2018-09-16 NOTE — Progress Notes (Signed)
Occupational Therapy Treatment Patient Details Name: Julie PedMargie Carvin MRN: [id number] DOB: [birth year] Today's Date: 09/16/2018    History of present illness Pt is a 68 y/o female admitted secondary to increased RLQ pain. 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 Pt with recent admission secondary to perforated appendicitis s/p drain placement.    OT comments  Pt very pleased with reacher and how it can help her with her ADL activity even while in the hospital.  Pt had pillow on abdomen with gait belt and stated this was very helpful!   Follow Up Recommendations  SNF;Supervision/Assistance - 24 hour    Equipment Recommendations  Other (comment)(TBD next venue)    Recommendations for Other Services      Precautions / Restrictions Precautions Precautions: Fall Precaution Comments: abdominal wound Restrictions Weight Bearing Restrictions: No       Mobility Bed Mobility Overal bed mobility: Needs Assistance Bed Mobility: Supine to Sit Rolling: Supervision   Supine to sit: Supervision;HOB elevated   Sit to sidelying: Modified independent (Device/Increase time) General bed mobility comments: Pt able to perform without assistance and supervision for technique only.  Transfers Overall transfer level: Needs assistance Equipment used: None Transfers: Sit to/from Stand Sit to Stand: Supervision         General transfer comment: Cues for hand placement and safety.  Pt able to come to standing without physical assistance.    Balance Overall balance assessment: Needs assistance Sitting-balance support: No upper extremity supported;Feet supported Sitting balance-Leahy Scale: Good       Standing balance-Leahy Scale: Fair                             ADL either performed or assessed with clinical judgement   ADL                       Lower Body Dressing: Moderate  assistance;Cueing for sequencing;With adaptive equipment;Cueing for safety;Cueing for compensatory techniques Lower Body Dressing Details (indicate cue type and reason): AE education .  Pt also able to use reacher to readjust sheets.  Pt very excited about reacher and unsure how she would obtain.  OT did issued pt a reacher.               General ADL Comments: AE education and reacher issued               Cognition Arousal/Alertness: Awake/alert Behavior During Therapy: WFL for tasks assessed/performed Overall Cognitive Status: Within Functional Limits for tasks assessed                                                     Pertinent Vitals/ Pain       Pain Score: 4  Pain Location: Abdomen Pain Descriptors / Indicators: Sore;Discomfort Pain Intervention(s): Limited activity within patient's tolerance;Monitored during session;Repositioned         Frequency  Min 2X/week        Progress Toward Goals  OT Goals(current goals can now be found in the care plan section)  Progress towards OT goals: Progressing toward goals     Plan Discharge plan remains appropriate       AM-PAC OT "6 Clicks" Daily Activity     Outcome Measure  Help from another person eating meals?: None Help from another person taking care of personal grooming?: A Little Help from another person toileting, which includes using toliet, bedpan, or urinal?: A Lot Help from another person bathing (including washing, rinsing, drying)?: A Lot Help from another person to put on and taking off regular upper body clothing?: A Little Help from another person to put on and taking off regular lower body clothing?: A Lot 6 Click Score: 16    End of Session    OT Visit Diagnosis: Unsteadiness on feet (R26.81);Other abnormalities of gait and mobility (R26.89);Muscle weakness (generalized) (M62.81);Pain Pain - part of body: (absomen)   Activity Tolerance Patient tolerated treatment well    Patient Left in bed;with call bell/phone within reach;with bed alarm set   Nurse Communication          Time: 1638-4536 OT Time Calculation (min): 14 min  Charges: OT General Charges $OT Visit: 1 Visit OT Treatments $Self Care/Home Management : 8-22 mins  Kari Baars, [place] [phone] Office- [place], Edwena Felty D 09/16/2018, 5:06 PM

## 2018-09-16 NOTE — Progress Notes (Signed)
7 Days Post-Op   Subjective/Chief Complaint: Feeling better every day. Was able to tolerate dressing changes much better last night thanks to her nurse. Tolerating soft diet and ensures, no more bloating.    Objective: Vital signs in last 24 hours: Temp:  [97.6 F (36.4 C)-98.5 F (36.9 C)] 98.5 F (36.9 C) (08/26 0828) Pulse Rate:  [74-78] 74 (08/26 0828) Resp:  [15-18] 18 (08/26 0828) BP: (123-138)/(73-86) 123/74 (08/26 0828) SpO2:  [96 %-100 %] 97 % (08/26 0828) Last BM Date: 09/15/18  Intake/Output from previous day: 08/25 0701 - 08/26 0700 In: 2906.5 [P.O.:1320; I.V.:1478.5; IV Piggyback:108] Out: 1 [Urine:1] Intake/Output this shift: Total I/O In: 717 [P.O.:717] Out: -   General appearance: alert and cooperative Resp: unlabored GI: soft, nontender, mildly distended. Midline wound dressing is clean and dry.    Lab Results:  No results for input(s): WBC, HGB, HCT, PLT in the last 72 hours. BMET Recent Labs    09/15/18 0847 09/16/18 0359  NA 137 139  K 3.4* 3.6  CL 103 107  CO2 26 23  GLUCOSE 128* 88  BUN 6* 5*  CREATININE 0.88 0.70  CALCIUM 8.8* 8.9   PT/INR No results for input(s): LABPROT, INR in the last 72 hours. ABG No results for input(s): PHART, HCO3 in the last 72 hours.  Invalid input(s): PCO2, PO2  Studies/Results: No results found.  Anti-infectives: Anti-infectives (From admission, onward)   Start     Dose/Rate Route Frequency Ordered Stop   09/12/18 1800  piperacillin-tazobactam (ZOSYN) IVPB 3.375 g     3.375 g 12.5 mL/hr over 240 Minutes Intravenous Every 8 hours 09/12/18 1457     09/08/18 1400  neomycin (MYCIFRADIN) tablet 1,000 mg     1,000 mg Oral 3 times per day 09/08/18 0812 09/08/18 2145   09/08/18 1400  metroNIDAZOLE (FLAGYL) tablet 1,000 mg     1,000 mg Oral 3 times per day 09/08/18 9702 09/08/18 2145   08/28/18 0000  amoxicillin-clavulanate (AUGMENTIN) 875-125 MG tablet     1 tablet Oral 2 times daily 08/28/18 1530  09/11/18 2359   08/26/18 0600  piperacillin-tazobactam (ZOSYN) IVPB 3.375 g  Status:  Discontinued     3.375 g 12.5 mL/hr over 240 Minutes Intravenous Every 8 hours 08/25/18 2141 09/12/18 1457   08/25/18 2115  ceFEPIme (MAXIPIME) 2 g in sodium chloride 0.9 % 100 mL IVPB     2 g 200 mL/hr over 30 Minutes Intravenous  Once 08/25/18 2102 08/25/18 2229   08/25/18 2115  metroNIDAZOLE (FLAGYL) IVPB 500 mg     500 mg 100 mL/hr over 60 Minutes Intravenous  Once 08/25/18 2102 08/25/18 2257      Assessment/Plan:  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 Brantley Stage -Surgical pathology:PERICECAL ABSCESS WITH GRANULATION TISSUE AND FIBROSIS CONSISTENT WITH PERFORATION;ONE BENIGN REACTIVE LYMPH NODE;NO EVIDENCE OF MALIGNANCY -BID WTDdressing changes to midline wound -continue multimodal pain control - Continue soft diet  FEN -soft diet VTE -SCDs, lovenox ID -Zosyn 8/5>>(will need through 8/28) Foley -d/c 8/20 Follow up - Dr. Brantley Stage  Plan: Working toward SNF placement. She is cleared medically for discharge when bed is available.    LOS: 21 days    Julie Richards 09/16/2018

## 2018-09-16 NOTE — Discharge Summary (Signed)
Physician Discharge Summary  Patient ID: Julie Richards MRN: [id number] DOB/AGE: [birth year] 68 y.o.  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 Stephenie Acres injection - 09/02/18 Dr. Anselm Pancoast  Laparoscopic assisted ileocecectomy - 09/09/18 Dr. Brantley Stage  HPI: Patient is a very pleasant 68 year old woman who was just discharged from general surgery service 1 month ago with a percutaneous drain in place for treatment of perforated appendicitis. She had been doing well with minimal drain output and the drain was removed in IR clinic on August 13, 2018. She followed up with Dr. Brantley Stage the following week and was doing well, however couple days ago began to have what she describes as a stitch in the right side and then persistent right lower quadrant pain beginning yesterday. This was associated with chills. Denies any change in appetite, nausea, emesis or diarrhea. Patient underwent workup which revealed recurrent abscess in the right lowe quadrant and inflammation of the right colon. Patient was admitted to the surgical service.  Hospital Course: IR consulted for drain placement in recurrent abscess, this was done 8/5. Patient was actually written for discharge 8/8 but did not leave because she was still having sharp abdominal pain. CT abdomen/pelvis repeated 8/10 and showed resolution in initial abscess but persistent inflammation and new fluid collection along anterior cecal margin. Pre-albumin checked 8/11 and was 10.7, calorie count initiated. IR did drain injection 8/12 to evaluate for fistula, fistula to cecum found. Patient worked to  improve nutrition with PO intake and protein shakes. 8/17 pre-albumin had increased to 21.4, bowel prep started 8/18. Patient taken to the OR as listed above 8/19 and tolerated procedure well. Surgical pathology showed perforated appendicitis with pericecal abscess and inflammatory tissue. Patient developed mild, expected post-operative ileus. Liquid diet initiated 8/23 with return in bowel function and advanced as tolerated from this point.   On 08/29/18 patient was tolerating a soft diet, having bowel function, pain well controlled, VSS and overall felt stable for discharge to SNF. Follow up is as outlined below. Twice daily dressing changes to midline wound to continue.  Please DC staples on 8/29 from midline wound and laparoscopic incisions.   I have personally looked this patient up in the [place] Controlled Substance Database and reviewed their medications.   Allergies as of 09/17/2018      Reactions   Oxycodone Hcl Nausea And Vomiting      Medication List    STOP taking these medications   ibuprofen 400 MG tablet Commonly known as: ADVIL   ondansetron 4 MG disintegrating tablet Commonly known as: ZOFRAN-ODT     TAKE these medications   acetaminophen 325 MG tablet Commonly known as: TYLENOL Take 2 tablets (650 mg total) by mouth every 6 (six) hours as needed for mild pain (or temp > 100). What changed: reasons to take this   gabapentin 100 MG capsule Commonly known as: NEURONTIN Take 2 capsules (200 mg total) by mouth 2 (two) times daily.   Melatonin 5 MG Tabs Take 5 mg by mouth every evening.   methocarbamol 750 MG tablet Commonly known as: ROBAXIN Take 1 tablet (750 mg total) by mouth every 8 (eight) hours as needed for muscle spasms.   multivitamin with minerals Tabs tablet Take 1 tablet by mouth daily.  OVER THE COUNTER MEDICATION Take 1 capsule by mouth daily. Mega Red   Potassium 99 MG Tabs Take 1 tablet by mouth daily.   traMADol 50 MG tablet Commonly known as:  ULTRAM Take 1-2 tablets (50-100 mg total) by mouth every 6 (six) hours as needed for moderate pain or severe pain.   VITAMIN D3 PO Take 1 capsule by mouth daily.         Contact information for follow-up providers    Harriette Bouillonornett, Thomas, MD. Go on 10/12/2018.   Specialty: General Surgery Why: Your appointment is 9/21 at 11:50 AM Please arrive 20 minutes prior to your appointment to check in. Contact information: [address] [place] [address] [phone]            Contact information for after-discharge care    Destination    [facility] .   Service: Skilled Nursing Contact information: [address] [place] [id number] [phone]                  Signed: Letha CapeKelly E Lilliahna Schubring , [facility] 09/17/2018, 9:28 AM Pager: [phone]

## 2018-09-17 ENCOUNTER — Non-Acute Institutional Stay (SKILLED_NURSING_FACILITY): Payer: Medicare Other | Admitting: Adult Health

## 2018-09-17 ENCOUNTER — Encounter: Payer: Self-pay | Admitting: Adult Health

## 2018-09-17 DIAGNOSIS — G4700 Insomnia, unspecified: Secondary | ICD-10-CM | POA: Diagnosis not present

## 2018-09-17 DIAGNOSIS — K383 Fistula of appendix: Secondary | ICD-10-CM | POA: Diagnosis not present

## 2018-09-17 DIAGNOSIS — R1084 Generalized abdominal pain: Secondary | ICD-10-CM

## 2018-09-17 DIAGNOSIS — R109 Unspecified abdominal pain: Secondary | ICD-10-CM | POA: Insufficient documentation

## 2018-09-17 DIAGNOSIS — E876 Hypokalemia: Secondary | ICD-10-CM | POA: Diagnosis not present

## 2018-09-17 MED ORDER — TRAMADOL HCL 50 MG PO TABS: 50.0000 mg | ORAL_TABLET | Freq: Four times a day (QID) | ORAL | 0 refills | Status: DC | PRN

## 2018-09-17 MED ORDER — TRAMADOL HCL 50 MG PO TABS: 50.0000 mg | ORAL_TABLET | Freq: Four times a day (QID) | ORAL | Status: DC | PRN

## 2018-09-17 MED ORDER — GABAPENTIN 100 MG PO CAPS: 200.0000 mg | ORAL_CAPSULE | Freq: Two times a day (BID) | ORAL | 0 refills | Status: DC

## 2018-09-17 MED ORDER — GABAPENTIN 100 MG PO CAPS
200.0000 mg | ORAL_CAPSULE | Freq: Two times a day (BID) | ORAL | Status: DC
  Administered 2018-09-17: 11:00:00 200 mg via ORAL
  Filled 2018-09-17: qty 2

## 2018-09-17 NOTE — Progress Notes (Signed)
Location:  [facility] Room Number: 308-A Place of Service:  SNF (31) Provider:  Kenard Gower, DNP, FNP-BC  Patient Care Team: Fleet Contras, MD as PCP - General (Internal Medicine)  Extended Emergency Contact Information Primary Emergency Contact: [facility] Phone: [phone] Relation: Daughter  Code Status:  Full Code  Goals of care: Advanced Directive information Advanced Directives 08/26/2018  Does Patient Have a Medical Advance Directive? No  Would patient like information on creating a medical advance directive? No - Patient declined     Chief Complaint  Patient presents with   Acute Visit    Patient is seen for hospital followup    HPI:  Pt is a 68 y.o. female seen today for hospital followup. She was admitted to [facility] on 09/17/18 for short-term rehabilitation following admission at [facility] 8/4-8/27/20 for a perforated appendix.  She has a percutaneous drain a month ago due to perforated appendicitis.  There was minimal drain output and the drain was removed in IR clinic on August 13, 2018.  She followed up the following week and was apparently doing well.  After a couple of days, she then began to have pain in the right side of her abdomen with associated chills.  Work-up revealed recurrent abscess in the right lower quadrant and inflammation of the right colon.  IR consulted for drain placement which was done on 8/5.  She continued to have sharp abdominal pain so CT abdomen/pelvis was repeated on 8/10 and showed resolution in the initial abscess but persistent inflammation and new fluid collection along the anterior cecal margin.  IR to drain injection 8/12 to evaluate for fistula, fistula of the cecum found.  She had a laparoscopic-assisted ileocecectomy on 09/09/2026.  Surgical pathology showed perforated appendicitis with pericecal abscess and inflammatory tissue.   She is now tolerating her diet and having  bowel function.  Dressing changes needed to midline wound twice a day and staples to be discontinued on 8/29 from midline wound and laparoscopic incisions.  She was seen in her room today.  She said that her abdominal pain is 3/10.  Abdominal dressing is intact.  History reviewed. No pertinent past medical history. Past Surgical History:  Procedure Laterality Date   IR RADIOLOGIST EVAL & MGMT  08/06/2018   IR RADIOLOGIST EVAL & MGMT  08/13/2018   IR SINUS/FIST TUBE CHK-NON GI  09/02/2018   LAPAROSCOPIC GASTRIC BANDING     LAPAROSCOPIC ILEOCECECTOMY N/A 09/09/2018   Procedure: LAPAROSCOPIC ASSISTED ILEOCECECTOMY;  Surgeon: Harriette Bouillon, MD;  Location: [facility] OR;  Service: General;  Laterality: N/A;    Allergies  Allergen Reactions   Oxycodone Hcl Nausea And Vomiting    Outpatient Encounter Medications as of 09/17/2018  Medication Sig   acetaminophen (TYLENOL) 325 MG tablet Take 2 tablets (650 mg total) by mouth every 6 (six) hours as needed for mild pain (or temp > 100).   Cholecalciferol (VITAMIN D3 PO) Take 1,000 Units by mouth daily.    gabapentin (NEURONTIN) 100 MG capsule Take 2 capsules (200 mg total) by mouth 2 (two) times daily.   Melatonin 5 MG TABS Take 5 mg by mouth every evening.   methocarbamol (ROBAXIN) 750 MG tablet Take 1 tablet (750 mg total) by mouth every 8 (eight) hours as needed for muscle spasms.   Multiple Vitamin (MULTIVITAMIN WITH MINERALS) TABS tablet Take 1 tablet by mouth daily.   OVER THE COUNTER MEDICATION Take 1 capsule by mouth daily. Mega Red   Potassium  99 MG TABS Take 1 tablet by mouth daily.   [DISCONTINUED] traMADol (ULTRAM) 50 MG tablet Take 1-2 tablets (50-100 mg total) by mouth every 6 (six) hours as needed for moderate pain or severe pain.   Facility-Administered Encounter Medications as of 09/17/2018  Medication   0.9 %  sodium chloride infusion   acetaminophen (TYLENOL) tablet 650 mg   Or   acetaminophen (TYLENOL) suppository  650 mg   bisacodyl (DULCOLAX) suppository 10 mg   diphenhydrAMINE (BENADRYL) 12.5 MG/5ML elixir 12.5 mg   Or   diphenhydrAMINE (BENADRYL) injection 12.5 mg   docusate sodium (COLACE) capsule 100 mg   enoxaparin (LOVENOX) injection 40 mg   feeding supplement (BOOST / RESOURCE BREEZE) liquid 1 Container   feeding supplement (ENSURE ENLIVE) (ENSURE ENLIVE) liquid 237 mL   gabapentin (NEURONTIN) capsule 200 mg   hydrALAZINE (APRESOLINE) injection 10 mg   HYDROmorphone (DILAUDID) injection 0.5 mg   HYDROmorphone (DILAUDID) tablet 2 mg   ketorolac (TORADOL) 30 MG/ML injection 30 mg   methocarbamol (ROBAXIN) tablet 750 mg   metoprolol tartrate (LOPRESSOR) injection 5 mg   ondansetron (ZOFRAN-ODT) disintegrating tablet 4 mg   Or   ondansetron (ZOFRAN) injection 4 mg   piperacillin-tazobactam (ZOSYN) IVPB 3.375 g   polyethylene glycol (MIRALAX / GLYCOLAX) packet 17 g   sodium chloride flush (NS) 0.9 % injection 10-40 mL   sodium chloride flush (NS) 0.9 % injection 10-40 mL   sodium chloride flush (NS) 0.9 % injection 5 mL   traMADol (ULTRAM) tablet 50-100 mg    Review of Systems  GENERAL: No change in appetite, no fatigue, no weight changes, no fever, chills or weakness MOUTH and THROAT: Denies oral discomfort, gingival pain or bleeding RESPIRATORY: no cough, SOB, DOE, wheezing, hemoptysis CARDIAC: No chest pain, edema or palpitations GI: No abdominal pain, diarrhea, constipation, heart burn, nausea or vomiting GU: Denies dysuria, frequency, hematuria, incontinence, or discharge NEUROLOGICAL: Denies dizziness, syncope, numbness, or headache PSYCHIATRIC: Denies feelings of depression or anxiety. No report of hallucinations, insomnia, paranoia, or agitation   There is no immunization history on file for this patient. Pertinent  Health Maintenance Due  Topic Date Due   PAP SMEAR-Modifier  10/08/1973   MAMMOGRAM  10/09/2002   COLONOSCOPY  10/09/2002   DEXA  SCAN  10/08/2017   PNA vac Low Risk Adult (1 of 2 - PCV13) 10/08/2017   INFLUENZA VACCINE  08/22/2018    Vitals:   09/17/18 1503  BP: 135/88  Pulse: 60  Resp: 18  Temp: (!) 97.5 F (36.4 C)  TempSrc: Oral  Weight: 297 lb 9.9 oz (135 kg)  Height: 5\' 9"  (1.753 m)   Body mass index is 43.95 kg/m.  Physical Exam  GENERAL APPEARANCE: Well nourished. In no acute distress. Morbidly obese. SKIN:  Abdominal surgical wound with dressing. MOUTH and THROAT: Lips are without lesions. Oral mucosa is moist and without lesions. Tongue is normal in shape, size, and color and without lesions RESPIRATORY: Breathing is even & unlabored, BS CTAB CARDIAC: RRR, no murmur,no extra heart sounds, no edema GI: Abdomen soft, normal BS EXTREMITIES:  Able to move X 4 extremities. NEUROLOGICAL: There is no tremor. Speech is clear. Alert and oriented X 3.  PSYCHIATRIC: Affect and behavior are appropriate  Labs reviewed: Recent Labs    09/12/18 0444 09/13/18 0857 09/15/18 0847 09/16/18 0359  NA 139 138 137 139  K 3.1* 3.3* 3.4* 3.6  CL 106 105 103 107  CO2 23 21* 26 23  GLUCOSE  101* 80 128* 88  BUN 7* 5* 6* 5*  CREATININE 0.80 0.71 0.88 0.70  CALCIUM 8.6* 8.5* 8.8* 8.9  MG 1.8 1.8 2.0  --    Recent Labs    07/22/18 0156 07/23/18 0530 08/25/18 1329  AST 12* 15 18  ALT 15 15 24   ALKPHOS 56 55 88  BILITOT 0.7 0.4 1.6*  PROT 6.3* 6.7 7.1  ALBUMIN 2.2* 2.2* 3.3*   Recent Labs    07/09/18 1348  07/18/18 1500  08/30/18 0506  09/10/18 1028 09/12/18 0444 09/13/18 0857  WBC 14.9*   < > 21.3*   < > 8.4   < > 15.2* 11.4* 8.3  NEUTROABS 12.0*  --  17.6*  --  5.8  --   --   --   --   HGB 14.0   < > 12.2   < > 10.6*   < > 11.9* 11.2* 10.0*  HCT 43.4   < > 38.0   < > 32.8*   < > 39.6 36.0 31.8*  MCV 85.4   < > 83.5   < > 84.1   < > 91.2 86.5 85.3  PLT 178   < > 326   < > 235   < > 323 267 246   < > = values in this interval not displayed.    Significant Diagnostic Results in last 30  days:  Ct Abdomen Pelvis W Contrast  Result Date: 08/31/2018 CLINICAL DATA:  Pt c/o continued presence of intermitted sharp stabbing abdominal pains. Reports that pain feels deep, not superficial at drain site. Denies sensation of bowel crampsHx of appy with infection and drains EXAM: CT ABDOMEN AND PELVIS WITH CONTRAST TECHNIQUE: Multidetector CT imaging of the abdomen and pelvis was performed using the standard protocol following bolus administration of intravenous contrast. CONTRAST:  OMNIPAQUE IOHEXOL 300 MG/ML  SOLN COMPARISON:  08/25/2018 FINDINGS: Lower chest: Mild dependent atelectasis.  No acute findings. Hepatobiliary: Liver normal in size. 17 mm low-density lesion, posterior right lobe, consistent with a cyst and stable. There are small additional more subtle low-density lesions in the right lobe that are also stable consistent with cysts. No other liver masses or lesions. Gallbladder is unremarkable. No bile duct dilation. Pancreas: Unremarkable. No pancreatic ductal dilatation or surrounding inflammatory changes. Spleen: Normal in size without focal abnormality. Adrenals/Urinary Tract: No adrenal masses. Kidneys are normal in size, orientation and position. There are 2 adjacent nonobstructing stones in the mid to upper pole of the right kidney. No other intrarenal stones. No masses. No hydronephrosis. Normal ureters. Bladder is unremarkable. Stomach/Bowel: Pigtail catheter has been placed through the right lower quadrant anterior abdominal wall. Pigtail curls along the medial margin of the cecum. There is adjacent inflammation. The fluid collection noted in this location on the prior CT has been evacuated. There is a small fluid collection that is now evident along the adjacent anterolateral peritoneal cavity, abutting the anterolateral cecum. This measures 5.7 cm from superior to inferior by 3.4 x 1.5 cm transversely. Inflammatory wall thickening is noted along the cecum and inferior ascending  colon. No other colonic wall thickening or adjacent inflammation. Small bowel is normal in caliber. No wall thickening or inflammation. The patient has a lap band, which lies approximately 6 cm below the gastroesophageal junction, and circling the upper stomach, unchanged compared to the prior CT. No stomach wall thickening or inflammation. Vascular/Lymphatic: No significant vascular findings are present. No enlarged abdominal or pelvic lymph nodes. Reproductive: Uterus normal in  size. Small calcified fibroid along the anterior uterine fundus. No adnexal masses. Other: No ascites or hernia. Musculoskeletal: No fracture or acute finding. No osteoblastic or osteolytic lesions. IMPRESSION: 1. Right lower quadrant pigtail catheter has evacuated the collection noted on the prior CT, along the medial margin of the cecum. 2. There is a smaller new collection along the anterolateral peritoneal cavity abutting the anterior margin of the cecum, measuring 5.7 x 1.5 x 3.4 cm. Persistent inflammatory changes are noted in the right lower quadrant adjacent to the pigtail catheter, cecum and lower ascending colon. 3. Lap band has slipped inferiorly on the stomach as described above. Electronically Signed   By: Lajean Manes M.D.   On: 08/31/2018 19:22   Ct Abdomen Pelvis W Contrast  Result Date: 08/25/2018 CLINICAL DATA:  Right lower quadrant pain. Peritonitis. EXAM: CT ABDOMEN AND PELVIS WITH CONTRAST TECHNIQUE: Multidetector CT imaging of the abdomen and pelvis was performed using the standard protocol following bolus administration of intravenous contrast. CONTRAST:  125mL OMNIPAQUE IOHEXOL 300 MG/ML  SOLN COMPARISON:  August 06, 2018 FINDINGS: Lower chest: No acute abnormality. Hepatobiliary: 1.7 cm cyst in the right lobe of the liver. At least 2 other too small to be actually characterized hypoattenuated lesions in the right lobe of the liver. Normal appearance of the gallbladder. Pancreas: Unremarkable. No pancreatic ductal  dilatation or surrounding inflammatory changes. Spleen: Normal in size without focal abnormality. Adrenals/Urinary Tract: Normal adrenal glands. Normal left kidney, ureters and urinary bladder. 8 mm nonobstructive calculus in the midpole region of the right kidney. Stomach/Bowel: Gastric band in stable position. No evidence of small-bowel obstruction. There is marked asymmetric circumferential thickening of the ascending colon with adjacent gas and fluid collection measuring 5.4 by 4.5 by 5.3 cm. Surrounding mesenteric stranding. Vascular/Lymphatic: No significant vascular findings are present. No enlarged abdominal or pelvic lymph nodes. Reproductive: Fibroid uterus. Normal adnexa. Other: No abdominal wall hernia or abnormality. No abdominopelvic ascites. Musculoskeletal: No acute or significant osseous findings. IMPRESSION: 1. Recurrent marked asymmetric circumferential thickening of the ascending colon with adjacent gas and fluid collection measuring 5.4 x 4.5 x 5.3 cm. 2. At least 2 too small to be actually characterized hypoattenuated lesions in the right lobe of the liver. 3. 8 mm nonobstructive right renal calculus. 4. Gastric band in stable position. 5. Fibroid uterus. Electronically Signed   By: Fidela Salisbury M.D.   On: 08/25/2018 20:49   Ir Sinus/fist Tube Chk-non Gi  Result Date: 09/02/2018 INDICATION: 68 year old with history of ruptured appendicitis and percutaneous drain. Evaluate for fistula connection. EXAM: SINUS TRACT INJECTION / FISTULOGRAM COMPARISON:  None. MEDICATIONS: None ANESTHESIA/SEDATION: None COMPLICATIONS: None immediate. CONTRAST:  5 mL Omnipaque 300 TECHNIQUE: Informed written consent was obtained from the patient after a thorough discussion of the procedural risks, benefits and alternatives. A timeout was performed prior to the initiation of the procedure. PROCEDURE: The existing drain was injected with contrast under fluoroscopic evaluation. Contrast was aspirated at the  end of the procedure and the catheter was flushed with saline and attached to gravity bag. FINDINGS: Drain in the right lower quadrant. Contrast fills around the pigtail catheter but there is no significant abscess collection. Some of the contrast drains around the tube to the skin surface. However, there is also a fistula tract to the cecum. 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 Electronically Signed   By: Markus Daft M.D.   On: 09/02/2018 12:15   Ct Image Guided Fluid Drain  By Catheter  Result Date: 08/27/2018 INDICATION: 68 year old female with a history ruptured appendicitis and recurrent abscess after drain removal EXAM: CT-GUIDED DRAINAGE RIGHT LOWER QUADRANT MEDICATIONS: The patient is currently admitted to the hospital and receiving intravenous antibiotics. The antibiotics were administered within an appropriate time frame prior to the initiation of the procedure. ANESTHESIA/SEDATION: Fentanyl 100 mcg IV; Versed .  2.0 mg IV Moderate Sedation Time:  17 minutes The patient was continuously monitored during the procedure by the interventional radiology nurse under my direct supervision. COMPLICATIONS: None PROCEDURE: Informed written consent was obtained from the patient after a thorough discussion of the procedural risks, benefits and alternatives. All questions were addressed. Maximal Sterile Barrier Technique was utilized including caps, mask, sterile gowns, sterile gloves, sterile drape, hand hygiene and skin antiseptic. A timeout was performed prior to the initiation of the procedure. Patient positioned supine position on CT gantry table. Scout CT was acquired for planning purposes. Using modified Seldinger technique, a 10 French drain was placed into periappendiceal abscess in the right lower quadrant. Aspiration of approximately 60 cc of purulent material was performed for culture. Drain was attached to bulb suction and sutured in position. Patient  tolerated the procedure well and remained hemodynamically stable throughout. No complications were encountered and no significant blood loss. IMPRESSION: Status post CT-guided right lower quadrant drain. Signed, Yvone NeuJaime S. Reyne DumasWagner, DO, RPVI [facility] Electronically Signed   By: Gilmer MorJaime  Wagner D.O.   On: 08/27/2018 07:27    Assessment/Plan  1. 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 Hypokalemia Lab Results  Component Value Date   K 3.6 09/16/2018  -Continue potassium 99 mg 1 tab daily  3. Generalized abdominal pain -We will continue tramadol 50 mg 1 tab every 6 hours PRN for pain, Robaxin 750 mg every 8 hours PRN and gabapentin 100 mg 2 capsules = 200 mg twice a day   4. Insomnia, unspecified type - will continue melatonin 5 mg 1 tab at bedtime    Family/ staff Communication: Discussed plan of care with resident.  Labs/tests ordered:  None  Goals of care:   Short-term care   Kenard GowerMonina Medina-Vargas, DNP, FNP-BC [facility] [phone] (Monday-Friday 8:00 a.m. - 5:00 p.m.) [phone] (after hours)

## 2018-09-17 NOTE — Progress Notes (Signed)
Physical Therapy Treatment Patient Details Name: Julie Richards MRN: [id number] DOB: 23-Mar-1950 Today's Date: 09/17/2018    History of Present Illness Pt is a 68 y/o female admitted secondary to increased RLQ pain. 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 Of note, pt with recent admission secondary to perforated appendicitis s/p drain placement.   PT Comments    Pt progressing with mobility. Remains limited by decreased activity tolerance, generalized weakness and pain; requires assist for ADLs. Continue to recommend SNF-level therapies to maximize functional mobility and independence prior to returning home alone. Pt remains motivated to participate and regain independent PLOF.   Follow Up Recommendations  SNF     Equipment Recommendations  None recommended by PT    Recommendations for Other Services       Precautions / Restrictions Precautions Precautions: Fall Precaution Comments: abdominal wound Restrictions Weight Bearing Restrictions: No    Mobility  Bed Mobility               General bed mobility comments: Received sitting EOB  Transfers Overall transfer level: Needs assistance Equipment used: None Transfers: Sit to/from Stand Sit to Stand: Supervision            Ambulation/Gait Ambulation/Gait assistance: Supervision Gait Distance (Feet): 300 Feet(+100) Assistive device: IV Pole Gait Pattern/deviations: Step-through pattern;Trunk flexed;Antalgic Gait velocity: Decreased   General Gait Details: Slow, guarded, antalgic gait with IV pole, with DOE 3/4 requiring prolonged seated rest break; SpO2 95% on RA   Stairs Stairs: Yes Stairs assistance: Min guard Stair Management: One rail Right;Step to pattern;Forwards Number of Stairs: 2 General stair comments: Ascend/descend 2 steps with RUE rail support; heavy reliance on rail due to significant pain   Wheelchair  Mobility    Modified Rankin (Stroke Patients Only)       Balance Overall balance assessment: Needs assistance Sitting-balance support: No upper extremity supported;Feet supported Sitting balance-Leahy Scale: Fair Sitting balance - Comments: Unable to reach bilateral feet due to pain with anterior weight translation   Standing balance support: Single extremity supported Standing balance-Leahy Scale: Fair                              Cognition Arousal/Alertness: Awake/alert Behavior During Therapy: WFL for tasks assessed/performed Overall Cognitive Status: Within Functional Limits for tasks assessed                                        Exercises      General Comments        Pertinent Vitals/Pain Pain Assessment: Faces Faces Pain Scale: Hurts even more Pain Location: Abdomen Pain Descriptors / Indicators: Sore;Discomfort Pain Intervention(s): Monitored during session;Limited activity within patient's tolerance;Other (comment)(pt wearing pillow on abdomen secured by gait belt)    Home Living                      Prior Function            PT Goals (current goals can now be found in the care plan section) Acute Rehab PT Goals Patient Stated Goal: to go to rehab then  home PT Goal Formulation: With patient Time For Goal Achievement: 09/24/18 Potential to Achieve Goals: Good Progress towards PT goals: Progressing toward goals    Frequency    Min 2X/week  PT Plan Current plan remains appropriate    Co-evaluation              AM-PAC PT "6 Clicks" Mobility   Outcome Measure  Help needed turning from your back to your side while in a flat bed without using bedrails?: A Little Help needed moving from lying on your back to sitting on the side of a flat bed without using bedrails?: A Little Help needed moving to and from a bed to a chair (including a wheelchair)?: A Little Help needed standing up from a chair  using your arms (e.g., wheelchair or bedside chair)?: A Little Help needed to walk in hospital room?: A Little Help needed climbing 3-5 steps with a railing? : A Little 6 Click Score: 18    End of Session Equipment Utilized During Treatment: Gait belt Activity Tolerance: Patient tolerated treatment well Patient left: in bed;with call bell/phone within reach Nurse Communication: Mobility status PT Visit Diagnosis: Other abnormalities of gait and mobility (R26.89);Muscle weakness (generalized) (M62.81);Difficulty in walking, not elsewhere classified (R26.2);Pain     Time: [id number] PT Time Calculation (min) (ACUTE ONLY): 15 min  Charges:  $Gait Training: 8-22 mins                    Ina HomesJaclyn Curtis Cain, PT, DPT Acute Rehabilitation Services  Pager [phone] Office [phone]  Malachy ChamberJaclyn L Lynley Killilea 09/17/2018, 12:40 PM

## 2018-09-17 NOTE — Plan of Care (Signed)
  Problem: Pain Managment: Goal: General experience of comfort will improve Outcome: Progressing   Problem: Safety: Goal: Ability to remain free from injury will improve Outcome: Progressing   Problem: Skin Integrity: Goal: Risk for impaired skin integrity will decrease Outcome: Progressing   

## 2018-09-17 NOTE — Discharge Instructions (Signed)
DC staples on 8/29 from midline wound and from laparoscopic incisions  [facility]      [facility], [place] [phone]  OPEN ABDOMINAL SURGERY: POST OP INSTRUCTIONS  Always review your discharge instruction sheet given to you by the facility where your surgery was performed.  IF YOU HAVE DISABILITY OR FAMILY LEAVE FORMS, YOU MUST BRING THEM TO THE OFFICE FOR PROCESSING.  PLEASE DO NOT GIVE THEM TO YOUR DOCTOR.  1. A prescription for pain medication may be given to you upon discharge.  Take your pain medication as prescribed, if needed.  If narcotic pain medicine is not needed, then you may take acetaminophen (Tylenol) or ibuprofen (Advil) as needed. 2. Take your usually prescribed medications unless otherwise directed. 3. If you need a refill on your pain medication, please contact your pharmacy. They will contact our office to request authorization.  Prescriptions will not be filled after 5pm or on week-ends. 4. You should follow a light diet the first few days after arrival home, such as soup and crackers, pudding, etc.unless your doctor has advised otherwise. A high-fiber, low fat diet can be resumed as tolerated.   Be sure to include lots of fluids daily. Most patients will experience some swelling and bruising on the chest and neck area.  Ice packs will help.  Swelling and bruising can take several days to resolve 5. Most patients will experience some swelling and bruising in the area of the incision. Ice pack will help. Swelling and bruising can take several days to resolve..  6. It is common to experience some constipation if taking pain medication after surgery.  Increasing fluid intake and taking a stool softener will usually help or prevent this problem from occurring.  A mild laxative (Milk of Magnesia or Miralax) should be taken according to package directions if there are no bowel movements after 48 hours. 7.  You may have steri-strips (small skin tapes) in place directly over the  incision.  These strips should be left on the skin for 7-10 days.  If your surgeon used skin glue on the incision, you may shower in 24 hours.  The glue will flake off over the next 2-3 weeks.  Any sutures or staples will be removed at the office during your follow-up visit. You may find that a light gauze bandage over your incision may keep your staples from being rubbed or pulled. You may shower and replace the bandage daily. 8. ACTIVITIES:  You may resume regular (light) daily activities beginning the next day--such as daily self-care, walking, climbing stairs--gradually increasing activities as tolerated.  You may have sexual intercourse when it is comfortable.  Refrain from any heavy lifting or straining until approved by your doctor. a. You may drive when you no longer are taking prescription pain medication, you can comfortably wear a seatbelt, and you can safely maneuver your car and apply brakes 9. You should see your doctor in the office for a follow-up appointment approximately two weeks after your surgery.  Make sure that you call for this appointment within a day or two after you arrive home to insure a convenient appointment time.  WHEN TO CALL YOUR DOCTOR: 1. Fever over 101.0 2. Inability to urinate 3. Nausea and/or vomiting 4. Extreme swelling or bruising 5. Continued bleeding from incision. 6. Increased pain, redness, or drainage from the incision. 7. Difficulty swallowing or breathing 8. Muscle cramping or spasms. 9. Numbness or tingling in hands or feet or around lips.  The clinic staff is  available to answer your questions during regular business hours.  Please dont hesitate to call and ask to speak to one of the nurses if you have concerns.  For further questions, please visit [email or web address]   MIDLINE WOUND CARE: - midline dressing to be changed twice daily - supplies: sterile saline, kerlix, scissors, ABD pads, tape  - remove dressing and all packing  carefully, moistening with sterile saline as needed to avoid packing/internal dressing sticking to the wound. - clean edges of skin around the wound with water/gauze, making sure there is no tape debris or leakage left on skin that could cause skin irritation or breakdown. - dampen and clean kerlix with sterile saline and pack wound from wound base to skin level, making sure to take note of any possible areas of wound tracking, tunneling and packing appropriately. Wound can be packed loosely. Trim kerlix to size if a whole kerlix is not required. - cover wound with a dry ABD pad and secure with tape.  - write the date/time on the dry dressing/tape to better track when the last dressing change occurred. - apply any skin protectant/powder recommended by clinician to protect skin/skin folds. - change dressing as needed if leakage occurs, wound gets contaminated, or patient requests to shower. - patient may shower daily with wound open and following the shower the wound should be dried and a clean dressing placed.

## 2018-09-17 NOTE — Progress Notes (Signed)
Patient ID: Julie Richards, female   DOB: [birth year], 68 y.o.   MRN: [id number]    8 Days Post-Op  Subjective: No new complaints.  Was unaware that she had a facility to go to.  Wants to speak to CM.  Otherwise tolerating her diet and moving her bowels well.  Objective: Vital signs in last 24 hours: Temp:  [97.5 F (36.4 C)-98.5 F (36.9 C)] 97.5 F (36.4 C) (08/27 0751) Pulse Rate:  [60-82] 60 (08/27 0751) Resp:  [16-18] 18 (08/27 0751) BP: (115-135)/(82-96) 135/88 (08/27 0751) SpO2:  [98 %-99 %] 99 % (08/27 0751) Last BM Date: 09/16/18  Intake/Output from previous day: 08/26 0701 - 08/27 0700 In: 947 [P.O.:947] Out: -  Intake/Output this shift: No intake/output data recorded.  PE: Abd: soft, appropriately tender, wound is clean and packed.  A couple of staples in place in midline incision and in laparoscopic incisions.  +BS  Lab Results:  No results for input(s): WBC, HGB, HCT, PLT in the last 72 hours. BMET Recent Labs    09/15/18 0847 09/16/18 0359  NA 137 139  K 3.4* 3.6  CL 103 107  CO2 26 23  GLUCOSE 128* 88  BUN 6* 5*  CREATININE 0.88 0.70  CALCIUM 8.8* 8.9   PT/INR No results for input(s): LABPROT, INR in the last 72 hours. CMP     Component Value Date/Time   NA 139 09/16/2018 0359   K 3.6 09/16/2018 0359   CL 107 09/16/2018 0359   CO2 23 09/16/2018 0359   GLUCOSE 88 09/16/2018 0359   BUN 5 (L) 09/16/2018 0359   CREATININE 0.70 09/16/2018 0359   CALCIUM 8.9 09/16/2018 0359   PROT 7.1 08/25/2018 1329   ALBUMIN 3.3 (L) 08/25/2018 1329   AST 18 08/25/2018 1329   ALT 24 08/25/2018 1329   ALKPHOS 88 08/25/2018 1329   BILITOT 1.6 (H) 08/25/2018 1329   GFRNONAA >60 09/16/2018 0359   GFRAA >60 09/16/2018 0359   Lipase     Component Value Date/Time   LIPASE 24 08/25/2018 1329       Studies/Results: No results found.  Anti-infectives: Anti-infectives (From admission, onward)   Start     Dose/Rate Route Frequency Ordered Stop   09/12/18  1800  piperacillin-tazobactam (ZOSYN) IVPB 3.375 g     3.375 g 12.5 mL/hr over 240 Minutes Intravenous Every 8 hours 09/12/18 1457     09/08/18 1400  neomycin (MYCIFRADIN) tablet 1,000 mg     1,000 mg Oral 3 times per day 09/08/18 0812 09/08/18 2145   09/08/18 1400  metroNIDAZOLE (FLAGYL) tablet 1,000 mg     1,000 mg Oral 3 times per day 09/08/18 0812 09/08/18 2145   08/28/18 0000  amoxicillin-clavulanate (AUGMENTIN) 875-125 MG tablet  Status:  Discontinued     1 tablet Oral 2 times daily 08/28/18 1530 09/16/18    08/26/18 0600  piperacillin-tazobactam (ZOSYN) IVPB 3.375 g  Status:  Discontinued     3.375 g 12.5 mL/hr over 240 Minutes Intravenous Every 8 hours 08/25/18 2141 09/12/18 1457   08/25/18 2115  ceFEPIme (MAXIPIME) 2 g in sodium chloride 0.9 % 100 mL IVPB     2 g 200 mL/hr over 30 Minutes Intravenous  Once 08/25/18 2102 08/25/18 2229   08/25/18 2115  metroNIDAZOLE (FLAGYL) IVPB 500 mg     500 mg 100 mL/hr over 60 Minutes Intravenous  Once 08/25/18 2102 08/25/18 2257       Assessment/Plan POD 8, 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 Luisa Hartornett -Surgical pathology:PERICECAL ABSCESS WITH GRANULATION TISSUE AND FIBROSIS CONSISTENT WITH PERFORATION;ONE BENIGN REACTIVE LYMPH NODE;NO EVIDENCE OF MALIGNANCY -BID WTDdressing changes to midline wound -continue multimodal pain control -Continue soft diet  FEN -soft diet VTE -SCDs, lovenox ID -Zosyn 8/5>>(will need through 8/28) Foley -d/c 8/20 Follow up - Dr. Luisa Hartornett  Dispo: COVID negative.  Stable for DC if CM has place for her   LOS: 22 days    Letha CapeKelly E Caydence Koenig , [facility] 09/17/2018, 9:29 AM Pager: [phone]

## 2018-09-17 NOTE — TOC Transition Note (Signed)
Transition of Care [facility]) - CM/SW Discharge Note   Patient Details  Name: Julie Richards MRN: [id number] Date of Birth: 12-13-1950  [facility] CM/SW Contact:  Eileen Stanford, LCSW Phone Number: 09/17/2018, 11:17 AM   Clinical Narrative:  Clinical Social Worker facilitated patient discharge including contacting patient family and facility to confirm patient discharge plans.  Clinical information faxed to facility and family agreeable with plan.  CSW arranged ambulance transport via [facility] to [facility] (room 308).  RN to call [phone] for report prior to discharge.   Final next level of care: Skilled Nursing Facility Barriers to Discharge: No Barriers Identified   Patient Goals and CMS Choice Patient states their goals for this hospitalization and ongoing recovery are:: to go to short term rehab CMS Medicare.gov Compare Post Acute Care list provided to:: Patient Choice offered to / list presented to : Patient  Discharge Placement              Patient chooses bed at: [facility] Patient to be transferred to facility by: [place] Name of family member notified: pt alert and oriented Patient and family notified of of transfer: 09/17/18  Discharge Plan and Services     Post Acute Care Choice: [place]                               Social Determinants of Health (SDOH) Interventions     Readmission Risk Interventions No flowsheet data found.

## 2018-09-17 NOTE — Progress Notes (Signed)
This Probation officer gave patient report to [employer]; gave update about patient's care and condition.Pt is in stable condition;not in respiratory distress.[facility] team to transport pt to [facility].

## 2018-09-17 NOTE — TOC Progression Note (Signed)
Transition of Care [facility]) - Progression Note    Patient Details  Name: Julie Richards MRN: [id number] Date of Birth: 04/18/1950  Transition of Care [facility]) CM/SW Contact  Eileen Stanford, LCSW Phone Number: 09/17/2018, 10:01 AM  Clinical Narrative:   CSW spoke with pt about d/c. Pt is understanding she will be going to [place] and is agreeable to transfer today.    Expected Discharge Plan: [place] Barriers to Discharge: Continued Medical Work up  Expected Discharge Plan and Services Expected Discharge Plan: [place] Choice: [place] arrangements for the past 2 months: Single Family Home Expected Discharge Date: 08/29/18                                     Social Determinants of Health (SDOH) Interventions    Readmission Risk Interventions No flowsheet data found.

## 2018-09-18 ENCOUNTER — Other Ambulatory Visit: Payer: Self-pay | Admitting: Adult Health

## 2018-09-18 ENCOUNTER — Non-Acute Institutional Stay (SKILLED_NURSING_FACILITY): Payer: Medicare Other | Admitting: Internal Medicine

## 2018-09-18 ENCOUNTER — Encounter: Payer: Self-pay | Admitting: Internal Medicine

## 2018-09-18 DIAGNOSIS — K383 Fistula of appendix: Secondary | ICD-10-CM

## 2018-09-18 DIAGNOSIS — Z9189 Other specified personal risk factors, not elsewhere classified: Secondary | ICD-10-CM

## 2018-09-18 DIAGNOSIS — K029 Dental caries, unspecified: Secondary | ICD-10-CM

## 2018-09-18 MED ORDER — TRAMADOL HCL 50 MG PO TABS: ORAL_TABLET | ORAL | 0 refills | Status: DC

## 2018-09-18 MED ORDER — TRAMADOL HCL 50 MG PO TABS: 50.0000 mg | ORAL_TABLET | Freq: Four times a day (QID) | ORAL | 0 refills | Status: DC | PRN

## 2018-09-18 NOTE — Progress Notes (Signed)
NURSING HOME LOCATION:  [facility] ROOM NUMBER:    CODE STATUS: Full Code  PCP:  Fleet ContrasEdwin Avbuere MD  This is a comprehensive admission note to [facility] performed on this date less than 30 days from date of admission. Included are preadmission medical/surgical history; reconciled medication list; family history; social history and comprehensive review of systems.  Corrections and additions to the records were documented. Comprehensive physical exam was also performed. Additionally a clinical summary was entered for each active diagnosis pertinent to this admission in the Problem List to enhance continuity of care.  HPI: The patient was hospitalized 8/4-8/27/2020 for perforated appendicitis with recurrent intra-abdominal abscesses. One month  prior to this admission she had been  discharged by general surgery with a percutaneous drain in place for the perforated appendix.  That drain was removed in Interventional Radiology clinic 7/23.  The following week general surgery follow-up found her to be stable; but several days prior to the second admission she began to have a "stitch" in the right side with subsequent persistent right lower quadrant pain beginning 8/3.  This was associated with chills.  Evaluation revealed recurrent abscess in the right lower quadrant and inflammation of the right colon prompting readmission 8/4. Course was complicated by appendiceal cutaneous fistula secondary to periappendiceal abscess, status post percutaneous drainage.  CT guided abscess drain placement was completed 8/5 by Dr. Loreta AveWagner; drain injection was completed 8/12 by Dr. Lowella DandyHenn with documentation of a fistula to the cecum.  On 8/19 Dr. Luisa Hartornett completed the laparoscopic assisted ileocecectomy. Suboptimal nutrition was found with a prealbumin of 10.7; this did improve to a value of 21.4 with increased p.o. intake and protein shakes. She was discharged to the SNF for rehab.  Wound care was to  complete wet-to-dry dressings twice daily.  Staples were to be removed 8/29.  Past medical and surgical history: She had lap band gastric surgery in approximately 2000.  She is gravida 2, para 3 (twins).  Social history: She is a nondrinker and non-smoker.  Family history: Maternal grandmother had lung cancer.  Family history is negative for diabetes, hypertension, MI, or stroke.   Review of systems: She validates that she is "on the road to recovery".  She denies significant abdominal pain.  She only took 1 tramadol prior to the wound dressing changed today.  She does have some tightness in the abdomen following meals.  She has had some loose stool but no frank diarrhea.  This is been a chronic issue worse with dairy intake.  She states that she has had intermittent depression and previously was on Zoloft.  She denies significant depression at this time.  She believes that she has apnea but has not been told this by another individual.  She was to have a sleep apnea evaluation but this was canceled as she was in the hospital.  She also realizes that she has dental caries; dental appointment was to be later this month but has been canceled.  She describes intermittent tingling in the lower extremities.  Constitutional: No fever,  fatigue  Eyes: No redness, discharge, pain, vision change ENT/mouth: No nasal congestion, purulent discharge, earache, change in hearing, sore throat  Cardiovascular: No chest pain, palpitations, paroxysmal nocturnal dyspnea, claudication, edema  Respiratory: No cough, sputum production, hemoptysis, DOE Gastrointestinal: No heartburn, dysphagia, nausea /vomiting, rectal bleeding, melena Genitourinary: No dysuria, hematuria, pyuria, incontinence, nocturia Musculoskeletal: No joint stiffness, joint swelling, weakness, pain Dermatologic: No rash, pruritus, change in appearance of skin Neurologic: No  dizziness, headache, syncope, seizures, numbness Endocrine: No change in  hair/skin/nails, excessive thirst, excessive hunger, excessive urination  Hematologic/lymphatic: No significant bruising, lymphadenopathy, abnormal bleeding Allergy/immunology: No itchy/watery eyes, significant sneezing, urticaria, angioedema  Physical exam:  Pertinent or positive findings: She is a very pleasant, articulate individual.  She appears younger than her stated age.  She has 2 carious teeth of the left mandible.  Breath sounds are decreased.  Wound was not visualized.  There is suggestion of slight clubbing of the fingernail beds.  General appearance: Adequately nourished; no acute distress, increased work of breathing is present.   Lymphatic: No lymphadenopathy about the head, neck, axilla. Eyes: No conjunctival inflammation or lid edema is present. There is no scleral icterus. Ears:  External ear exam shows no significant lesions or deformities.   Nose:  External nasal examination shows no deformity or inflammation. Nasal mucosa are pink and moist without lesions, exudates Oral exam: There is no oropharyngeal erythema or exudate. Neck:  No thyromegaly, masses, tenderness noted.    Heart:  Normal rate and regular rhythm. S1 and S2 normal without gallop, murmur, click, rub.  Lungs:  without wheezes, rhonchi, rales, rubs. Abdomen: Bowel sounds are normal.  GU: Deferred  Extremities:  No cyanosis, clubbing, edema. Neurologic exam:  Strength equal  in upper & lower extremities. Balance, Rhomberg, finger to nose testing could not be completed due to clinical state Skin: Warm & dry w/o tenting. No significant  rash.  See clinical summary under each active problem in the Problem List with associated updated therapeutic plan

## 2018-09-18 NOTE — Patient Instructions (Signed)
See assessment and plan under each diagnosis in the problem list and acutely for this visit 

## 2018-09-18 NOTE — Assessment & Plan Note (Signed)
Wet-to-dry dressings at the SNF by the Wound Care Nurse She stated that pain is improved and she only needed 1 tramadol pre-dressing change today.

## 2018-10-13 ENCOUNTER — Encounter: Payer: Self-pay | Admitting: Adult Health

## 2018-10-13 ENCOUNTER — Non-Acute Institutional Stay (SKILLED_NURSING_FACILITY): Payer: Medicare Other | Admitting: Adult Health

## 2018-10-13 DIAGNOSIS — E876 Hypokalemia: Secondary | ICD-10-CM | POA: Diagnosis not present

## 2018-10-13 DIAGNOSIS — R1084 Generalized abdominal pain: Secondary | ICD-10-CM

## 2018-10-13 DIAGNOSIS — G4700 Insomnia, unspecified: Secondary | ICD-10-CM | POA: Diagnosis not present

## 2018-10-13 DIAGNOSIS — K383 Fistula of appendix: Secondary | ICD-10-CM

## 2018-10-13 MED ORDER — POTASSIUM 99 MG PO TABS: 1.0000 | ORAL_TABLET | Freq: Every day | ORAL | 0 refills | Status: DC

## 2018-10-13 NOTE — Progress Notes (Signed)
Location:  [facility] Room Number: 220/A Place of Service:  SNF (31) Provider:  Kenard Gower, DNP, FNP-BC  Patient Care Team: Fleet Contras, MD as PCP - General (Internal Medicine)  Extended Emergency Contact Information Primary Emergency Contact: [facility] Phone: [phone] Relation: Daughter  Code Status:  Full Code  Goals of care: Advanced Directive information Advanced Directives 10/13/2018  Does Patient Have a Medical Advance Directive? Yes  Type of Advance Directive (No Data)  Does patient want to make changes to medical advance directive? No - Patient declined  Would patient like information on creating a medical advance directive? -     Chief Complaint  Patient presents with   Discharge Note    Discharge Visit    HPI:  Pt is a 68 y.o. female seen today for a discharge visit. She will have home health PT and OT.  She has been admitted to [facility] on 09/17/2018 following admission at [facility] 09/17/18 for a perforated appendix.  She had a percutaneous drain a month ago due to perforated appendicitis.  There  was a minimal drain output and the drain was removed in IR clinic on August 13, 2018.  She followed up with the following week and was apparently doing well.  After a couple of days, she then began to have pain in the right side upper abdomen with associated chills.  Work-up revealed recurrent abscess in the right lower quadrant and inflammation of the right colon.  IR consulted for drain placement which was done on 8/5.  She continued to have sharp abdominal pain so CT abdomen/pelvis was repeated on 8/10 and showed's resolution in the initial abscess but persistent inflammation and new fluid collection along the anterior cecal margin.  IR to drain injection 8/12 to evaluate for fistula, fistula of the cecum found.  She had a laparoscopic-assisted ileocecectomy on 09/09/2026.  Surgical pathology showed  perforated appendicitis with pericecal abscess and inflammatory tissue.   She now tolerates her diet and have bowel function.   Patient was admitted to this facility for short-term rehabilitation after the patient's recent hospitalization.  Patient has completed SNF rehabilitation and therapy has cleared the patient for discharge.   No past medical history on file. Past Surgical History:  Procedure Laterality Date   IR RADIOLOGIST EVAL & MGMT  08/06/2018   IR RADIOLOGIST EVAL & MGMT  08/13/2018   IR SINUS/FIST TUBE CHK-NON GI  09/02/2018   LAPAROSCOPIC GASTRIC BANDING     LAPAROSCOPIC ILEOCECECTOMY N/A 09/09/2018   Procedure: LAPAROSCOPIC ASSISTED ILEOCECECTOMY;  Surgeon: Harriette Bouillon, MD;  Location: [facility] OR;  Service: General;  Laterality: N/A;    Allergies  Allergen Reactions   Oxycodone Hcl Nausea And Vomiting    Outpatient Encounter Medications as of 10/13/2018  Medication Sig   acetaminophen (TYLENOL) 325 MG tablet Take 2 tablets (650 mg total) by mouth every 6 (six) hours as needed for mild pain (or temp > 100).   Alum & Mag Hydroxide-Simeth (MYLANTA PO) Give Mylanta 30cc p.o. every 2 hours PRN X 24 hour. Notify MD if indigestion is not relieved within 24 hours (Physician Order)   bisacodyl (DULCOLAX) 10 MG suppository If not relieved by MOM, give 10 mg Bisacodyl suppositiory rectally X 1 dose in 24 hours as needed (Do not use constipation standing orders for residents with renal failure/CFR less than 30. Contact MD for orders) (Physician Order)   Cholecalciferol (VITAMIN D3 PO) Take 1,000 Units by mouth daily.  magnesium hydroxide (MILK OF MAGNESIA) 400 MG/5ML suspension If no BM in 3 days, give 30 cc Milk of Magnesium p.o. x 1 dose in 24 hours as needed (Do not use standing constipation orders for residents with renal failure CFR less than 30. Contact MD for orders) (Physician Order)   Melatonin 5 MG TABS Take 5 mg by mouth every evening.   Multiple Vitamin  (MULTIVITAMIN WITH MINERALS) TABS tablet Take 1 tablet by mouth daily.   Potassium Gluconate 595 MG CAPS TAKE 1 TABLET BY MOUTH ONCE DAILY FOR SUPPLEMENT   Sodium Phosphates (RA SALINE ENEMA RE) If not relieved by Biscodyl suppository, give disposable Saline Enema rectally X 1 dose/24 hrs as needed (Do not use constipation standing orders for residents with renal failure/CFR less than 30. Contact MD for orders)(Physician Or   [DISCONTINUED] gabapentin (NEURONTIN) 100 MG capsule Take 2 capsules (200 mg total) by mouth 2 (two) times daily.   [DISCONTINUED] methocarbamol (ROBAXIN) 750 MG tablet Take 1 tablet (750 mg total) by mouth every 8 (eight) hours as needed for muscle spasms.   [DISCONTINUED] traMADol (ULTRAM) 50 MG tablet Take 1 tablet (50 mg total) by mouth every 6 (six) hours as needed for up to 60 doses.   [DISCONTINUED] OVER THE COUNTER MEDICATION Take 1 capsule by mouth daily. Mega Red   [DISCONTINUED] Potassium 99 MG TABS Take 1 tablet by mouth daily.   [DISCONTINUED] Potassium 99 MG TABS Take 1 tablet (99 mg total) by mouth daily.   No facility-administered encounter medications on file as of 10/13/2018.     Review of Systems  GENERAL: No change in appetite, no fatigue, no weight changes, no fever, chills or weakness MOUTH and THROAT: Denies oral discomfort, gingival pain or bleeding RESPIRATORY: no cough, SOB, DOE, wheezing, hemoptysis CARDIAC: No chest pain, edema or palpitations GI: No abdominal pain, diarrhea, constipation, heart burn, nausea or vomiting GU: Denies dysuria, frequency, hematuria, incontinence, or discharge NEUROLOGICAL: Denies dizziness, syncope, numbness, or headache PSYCHIATRIC: Denies feelings of depression or anxiety. No report of hallucinations, insomnia, paranoia, or agitation   There is no immunization history on file for this patient. Pertinent  Health Maintenance Due  Topic Date Due   MAMMOGRAM  10/09/2002   INFLUENZA VACCINE  11/12/2018  (Originally 08/22/2018)   DEXA SCAN  11/12/2018 (Originally 10/08/2017)   COLONOSCOPY  11/12/2018 (Originally 10/09/2002)   PNA vac Low Risk Adult (1 of 2 - PCV13) 11/12/2018 (Originally 10/08/2017)   No flowsheet data found.   Vitals:   10/13/18 1108  BP: 127/68  Pulse: 73  Resp: 18  Temp: (!) 97.1 F (36.2 C)  TempSrc: Oral  SpO2: 94%  Weight: 283 lb 8 oz (128.6 kg)  Height: 5\' 9"  (1.753 m)   Body mass index is 41.87 kg/m.  Physical Exam  GENERAL APPEARANCE: Well nourished. In no acute distress. Morbidly obese SKIN:  Mid abdomen surgical wound with dry dressing MOUTH and THROAT: Lips are without lesions. Oral mucosa is moist and without lesions. Tongue is normal in shape, size, and color and without lesions RESPIRATORY: Breathing is even & unlabored, BS CTAB CARDIAC: RRR, no murmur,no extra heart sounds, no edema GI: Abdomen soft, normal BS, no masses, no tenderness EXTREMITIES:  Able to move X 4 extremities NEUROLOGICAL: There is no tremor. Speech is clear. Alert and oriented X 3. PSYCHIATRIC:  Affect and behavior are appropriate  Labs reviewed: Recent Labs    09/12/18 0444 09/13/18 0857 09/15/18 0847 09/16/18 0359  NA 139 138 137 139  K 3.1* 3.3* 3.4* 3.6  CL 106 105 103 107  CO2 23 21* 26 23  GLUCOSE 101* 80 128* 88  BUN 7* 5* 6* 5*  CREATININE 0.80 0.71 0.88 0.70  CALCIUM 8.6* 8.5* 8.8* 8.9  MG 1.8 1.8 2.0  --    Recent Labs    07/22/18 0156 07/23/18 0530 08/25/18 1329  AST 12* 15 18  ALT 15 15 24   ALKPHOS 56 55 88  BILITOT 0.7 0.4 1.6*  PROT 6.3* 6.7 7.1  ALBUMIN 2.2* 2.2* 3.3*   Recent Labs    07/09/18 1348  07/18/18 1500  08/30/18 0506  09/10/18 1028 09/12/18 0444 09/13/18 0857  WBC 14.9*   < > 21.3*   < > 8.4   < > 15.2* 11.4* 8.3  NEUTROABS 12.0*  --  17.6*  --  5.8  --   --   --   --   HGB 14.0   < > 12.2   < > 10.6*   < > 11.9* 11.2* 10.0*  HCT 43.4   < > 38.0   < > 32.8*   < > 39.6 36.0 31.8*  MCV 85.4   < > 83.5   < > 84.1   < >  91.2 86.5 85.3  PLT 178   < > 326   < > 235   < > 323 267 246   < > = values in this interval not displayed.     Assessment/Plan  1. 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 Hypokalemia Lab Results  Component Value Date   K 3.6 09/16/2018   - continue Potassium gluconate 595 1 tab daily  3. Generalized abdominal pain - she verbalized having no pain and requested to discontinue Gabapentin, Robaxin and Tramadol, continue Acetaminophen PRN  4. Insomnia, unspecified type - continue Melatonin     I have filled out patient's discharge paperwork. Patient will have home health PT and OT.  DME provided: None Greater than 50% was spent in counseling and coordination of care.   Total discharge time: Greater than 30 minutes   Discharge time involved coordination of the discharge process with social worker, nursing staff and therapy department. Medical justification for home health services/DME verified.   Durenda Age, DNP, FNP-BC [facility] [phone] (Monday-Friday 8:00 a.m. - 5:00 p.m.) [phone] (after hours)

## 2019-03-19 ENCOUNTER — Ambulatory Visit: Payer: Medicare Other | Attending: Internal Medicine

## 2019-03-19 DIAGNOSIS — Z23 Encounter for immunization: Secondary | ICD-10-CM

## 2019-03-19 NOTE — Progress Notes (Signed)
   Covid-19 Vaccination Clinic  Name:  Julie Richards    MRN: [id number] DOB: 09/24/1950  03/19/2019  [name] was observed post Covid-19 immunization for 15 minutes without incidence. She was provided with Vaccine Information Sheet and instruction to access the V-Safe system.   [name] was instructed to call 911 with any severe reactions post vaccine: Marland Kitchen Difficulty breathing  . Swelling of your face and throat  . A fast heartbeat  . A bad rash all over your body  . Dizziness and weakness    Immunizations Administered    Name Date Dose VIS Date Route   Pfizer COVID-19 Vaccine 03/19/2019  3:08 PM 0.3 mL 01/01/2019 Intramuscular   Manufacturer: [employer], [employer]   Lot: [id number]   NDC: [address]-7

## 2019-04-14 ENCOUNTER — Ambulatory Visit: Payer: Medicare Other | Attending: Internal Medicine

## 2019-04-16 ENCOUNTER — Ambulatory Visit: Payer: Medicare Other | Attending: Internal Medicine

## 2019-04-16 DIAGNOSIS — Z23 Encounter for immunization: Secondary | ICD-10-CM

## 2019-04-16 NOTE — Progress Notes (Signed)
   Covid-19 Vaccination Clinic  Name:  Raja Caputi    MRN: [id number] DOB: 1950-10-16  04/16/2019  Ms. Maslowski was observed post Covid-19 immunization for 15 minutes without incident. She was provided with Vaccine Information Sheet and instruction to access the V-Safe system.   Ms. Christenson was instructed to call 911 with any severe reactions post vaccine: Marland Kitchen Difficulty breathing  . Swelling of face and throat  . A fast heartbeat  . A bad rash all over body  . Dizziness and weakness   Immunizations Administered    Name Date Dose VIS Date Route   Pfizer COVID-19 Vaccine 04/16/2019  1:23 PM 0.3 mL 01/01/2019 Intramuscular   Manufacturer: [employer], [employer]   Lot: [id number]   NDC: [address]-4

## 2019-05-10 ENCOUNTER — Other Ambulatory Visit: Payer: Self-pay | Admitting: Internal Medicine

## 2019-05-10 DIAGNOSIS — N6310 Unspecified lump in the right breast, unspecified quadrant: Secondary | ICD-10-CM

## 2019-05-19 ENCOUNTER — Other Ambulatory Visit: Payer: Self-pay

## 2019-05-19 ENCOUNTER — Ambulatory Visit
Admission: RE | Admit: 2019-05-19 | Discharge: 2019-05-19 | Disposition: A | Discharge status: Home / Self Care | Payer: Medicare Other | Source: Ambulatory Visit | Attending: Internal Medicine | Admitting: Internal Medicine

## 2019-05-19 DIAGNOSIS — N6310 Unspecified lump in the right breast, unspecified quadrant: Secondary | ICD-10-CM

## 2019-10-14 ENCOUNTER — Ambulatory Visit: Payer: Medicare Other | Admitting: Obstetrics and Gynecology

## 2020-02-14 NOTE — Progress Notes (Deleted)
Office Visit Note  Patient: Julie Richards             Date of Birth: 1950-11-16           MRN: [id number]             PCP: Fleet Contras, MD Referring: Fleet Contras, MD Visit Date: 02/28/2020 Occupation: @GUAROCC @  Subjective:  No chief complaint on file.   History of Present Illness: Julie Richards is a 70 y.o. female ***   Activities of Daily Living:  Patient reports morning stiffness for *** {minute/hour:19697}.   Patient {ACTIONS;DENIES/REPORTS:21021675::"Denies"} nocturnal pain.  Difficulty dressing/grooming: {ACTIONS;DENIES/REPORTS:21021675::"Denies"} Difficulty climbing stairs: {ACTIONS;DENIES/REPORTS:21021675::"Denies"} Difficulty getting out of chair: {ACTIONS;DENIES/REPORTS:21021675::"Denies"} Difficulty using hands for taps, buttons, cutlery, and/or writing: {ACTIONS;DENIES/REPORTS:21021675::"Denies"}  No Rheumatology ROS completed.   PMFS History:  Patient Active Problem List   Diagnosis Date Noted  . 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 Hypokalemia 09/17/2018  . Abdominal pain 09/17/2018  . Insomnia 09/17/2018  . Postprocedural intraabdominal abscess 08/25/2018  . Abscess of abdominal cavity (HCC) 07/18/2018  . Perforated appendicitis 07/09/2018    No past medical history on file.  No family history on file. Past Surgical History:  Procedure Laterality Date  . IR RADIOLOGIST EVAL & MGMT  08/06/2018  . IR RADIOLOGIST EVAL & MGMT  08/13/2018  . IR SINUS/FIST TUBE CHK-NON GI  09/02/2018  . LAPAROSCOPIC GASTRIC BANDING    . LAPAROSCOPIC ILEOCECECTOMY N/A 09/09/2018   Procedure: LAPAROSCOPIC ASSISTED ILEOCECECTOMY;  Surgeon: 09/11/2018, MD;  Location: [facility] OR;  Service: General;  Laterality: N/A;   Social History   Social History Narrative  . Not on file   Immunization History  Administered Date(s) Administered  . PFIZER(Purple Top)SARS-COV-2 Vaccination 03/19/2019, 04/16/2019      Objective: Vital Signs: There were no vitals taken for this visit.   Physical Exam   Musculoskeletal Exam: ***  CDAI Exam: CDAI Score: -- Patient Global: --; Provider Global: -- Swollen: --; Tender: -- Joint Exam 02/28/2020   No joint exam has been documented for this visit   There is currently no information documented on the homunculus. Go to the Rheumatology activity and complete the homunculus joint exam.  Investigation: No additional findings.  Imaging: No results found.  Recent Labs: Lab Results  Component Value Date   WBC 8.3 09/13/2018   HGB 10.0 (L) 09/13/2018   PLT 246 09/13/2018   NA 139 09/16/2018   K 3.6 09/16/2018   CL 107 09/16/2018   CO2 23 09/16/2018   GLUCOSE 88 09/16/2018   BUN 5 (L) 09/16/2018   CREATININE 0.70 09/16/2018   BILITOT 1.6 (H) 08/25/2018   ALKPHOS 88 08/25/2018   AST 18 08/25/2018   ALT 24 08/25/2018   PROT 7.1 08/25/2018   ALBUMIN 3.3 (L) 08/25/2018   CALCIUM 8.9 09/16/2018   GFRAA >60 09/16/2018    Speciality Comments: No specialty comments available.  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  No orders of the defined types were placed in this encounter.   Face-to-face time  spent with patient was *** minutes. Greater than 50% of time was spent in counseling and coordination of care.  Follow-Up Instructions: No follow-ups on file.   Gearldine Bienenstock, PA-C  Note - This record has been created using Dragon software.  Chart creation errors have been  sought, but may not always  have been located. Such creation errors do not reflect on  the standard of medical care.

## 2020-02-28 ENCOUNTER — Ambulatory Visit: Payer: Medicare Other | Admitting: Rheumatology

## 2020-02-28 DIAGNOSIS — G4709 Other insomnia: Secondary | ICD-10-CM

## 2020-02-28 DIAGNOSIS — K383 Fistula of appendix: Secondary | ICD-10-CM

## 2020-02-28 DIAGNOSIS — Z8669 Personal history of other diseases of the nervous system and sense organs: Secondary | ICD-10-CM

## 2020-02-28 DIAGNOSIS — T8143XA Infection following a procedure, organ and space surgical site, initial encounter: Secondary | ICD-10-CM

## 2020-02-28 DIAGNOSIS — R768 Other specified abnormal immunological findings in serum: Secondary | ICD-10-CM

## 2020-02-28 DIAGNOSIS — M170 Bilateral primary osteoarthritis of knee: Secondary | ICD-10-CM

## 2020-02-28 DIAGNOSIS — H04123 Dry eye syndrome of bilateral lacrimal glands: Secondary | ICD-10-CM

## 2020-03-16 ENCOUNTER — Ambulatory Visit (INDEPENDENT_AMBULATORY_CARE_PROVIDER_SITE_OTHER): Payer: Medicare Other | Admitting: Obstetrics and Gynecology

## 2020-03-16 ENCOUNTER — Other Ambulatory Visit: Payer: Self-pay

## 2020-03-16 ENCOUNTER — Encounter: Payer: Self-pay | Admitting: Obstetrics and Gynecology

## 2020-03-16 VITALS — BP 152/83 | HR 65 | Ht 69.0 in | Wt 306.6 lb

## 2020-03-16 DIAGNOSIS — Z01419 Encounter for gynecological examination (general) (routine) without abnormal findings: Secondary | ICD-10-CM

## 2020-03-16 DIAGNOSIS — Z13820 Encounter for screening for osteoporosis: Secondary | ICD-10-CM | POA: Diagnosis not present

## 2020-03-16 DIAGNOSIS — R102 Pelvic and perineal pain: Secondary | ICD-10-CM

## 2020-03-16 LAB — POCT URINALYSIS DIPSTICK
Bilirubin, UA: NEGATIVE
Glucose, UA: NEGATIVE
Ketones, UA: NEGATIVE
Nitrite, UA: NEGATIVE
Protein, UA: NEGATIVE
Spec Grav, UA: 1.025 (ref 1.010–1.025)
Urobilinogen, UA: 0.2 E.U./dL
pH, UA: 6 (ref 5.0–8.0)

## 2020-03-16 NOTE — Progress Notes (Signed)
Subjective:     Julie Richards is a 70 y.o. female postmenopausal with BMI 45 who is here for a comprehensive physical exam. The patient reports a 6 month history of intermittent lower abdominal pain with right side greater than left. Patient describes the pain as burning sensation which comes unpredictably and resolves with ibuprofen. Patient also states that the pain radiates to her lower back. Patient is not sexually active. She denies pelvic pain or abnormal discharge. She denies urinary symptoms. She reports a regular bowel movement. Patient admits to being very stressed and depressed. She has lost 72 family members and friends since the onset of covid-19 pandemic. Her daughter in law was recently diagnosed with stage 4 ovarian cancer. Patient is very concerned that her right sided pain is related to her ovaries. Patient denies any history of abnormal pap smear  Past Medical History:  Diagnosis Date  . Osteoarthritis    Past Surgical History:  Procedure Laterality Date  . IR RADIOLOGIST EVAL & MGMT  08/06/2018  . IR RADIOLOGIST EVAL & MGMT  08/13/2018  . IR SINUS/FIST TUBE CHK-NON GI  09/02/2018  . LAPAROSCOPIC GASTRIC BANDING    . LAPAROSCOPIC ILEOCECECTOMY N/A 09/09/2018   Procedure: LAPAROSCOPIC ASSISTED ILEOCECECTOMY;  Surgeon: Harriette Bouillon, MD;  Location: [facility] OR;  Service: General;  Laterality: N/A;   History reviewed. No pertinent family history.   Social History   Socioeconomic History  . Marital status: Widowed    Spouse name: Not on file  . Number of children: Not on file  . Years of education: Not on file  . Highest education level: Not on file  Occupational History  . Not on file  Tobacco Use  . Smoking status: Never Smoker  . Smokeless tobacco: Never Used  Vaping Use  . Vaping Use: Never used  Substance and Sexual Activity  . Alcohol use: Never  . Drug use: Never  . Sexual activity: Not Currently  Other Topics Concern  . Not on file  Social History Narrative   . Not on file   Social Determinants of Health   Financial Resource Strain: Not on file  Food Insecurity: Not on file  Transportation Needs: Not on file  Physical Activity: Not on file  Stress: Not on file  Social Connections: Not on file  Intimate Partner Violence: Not on file   Health Maintenance  Topic Date Due  . Hepatitis C Screening  Never done  . TETANUS/TDAP  Never done  . DEXA SCAN  Never done  . PNA vac Low Risk Adult (1 of 2 - PCV13) Never done  . INFLUENZA VACCINE  Never done  . MAMMOGRAM  05/18/2021  . COLONOSCOPY (Pts 45-72yrs Insurance coverage will need to be confirmed)  08/21/2028  . COVID-19 Vaccine  Completed       Review of Systems Pertinent items noted in HPI and remainder of comprehensive ROS otherwise negative.   Objective:  Blood pressure (!) 152/83, pulse 65, height 5\' 9"  (1.753 m), weight (!) 306 lb 9.6 oz (139.1 kg).     GENERAL: Well-developed, well-nourished female in no acute distress.  HEENT: Normocephalic, atraumatic. Sclerae anicteric.  NECK: Supple. Normal thyroid.  LUNGS: Clear to auscultation bilaterally.  HEART: Regular rate and rhythm. BREASTS: Symmetric in size. No palpable masses or lymphadenopathy, skin changes, or nipple drainage. ABDOMEN: Soft, nontender, nondistended. No organomegaly. PELVIC: Normal external female genitalia. Vagina is pale and atrophic.  Normal discharge. Normal appearing cervix. Uterus is normal in size. No adnexal mass or  tenderness. Bimanual exam limited secondary to body habitus EXTREMITIES: No cyanosis, clubbing, or edema, 2+ distal pulses.    Assessment:    Healthy female exam.      Plan:    Pap smear no longer indicated Patient with normal mammogram in 2021 Patient reports a normal colonoscopy in 2020 Bone density scan ordered Pelvic ultrasound ordered to evaluate pelvic pain Patient will be contacted with abnormal results RTC prn See After Visit Summary for Counseling Recommendations

## 2020-03-16 NOTE — Progress Notes (Signed)
Pt presents today for new patient exam. Last pap 2007, WNL. Pt c/o intermittent, pelvic pain, mainly on the right side. Pt has had BTL. States she is also concerned about kidneys. Denies any vaginal bleeding or problems with urination.

## 2020-03-20 ENCOUNTER — Ambulatory Visit: Payer: Medicare Other | Admitting: Rheumatology

## 2020-03-28 ENCOUNTER — Ambulatory Visit
Admission: RE | Admit: 2020-03-28 | Discharge: 2020-03-28 | Disposition: A | Discharge status: Home / Self Care | Payer: Medicare Other | Source: Ambulatory Visit | Attending: Obstetrics and Gynecology | Admitting: Obstetrics and Gynecology

## 2020-03-28 DIAGNOSIS — R102 Pelvic and perineal pain: Secondary | ICD-10-CM

## 2020-03-29 ENCOUNTER — Other Ambulatory Visit (HOSPITAL_BASED_OUTPATIENT_CLINIC_OR_DEPARTMENT_OTHER): Payer: Self-pay

## 2020-03-29 DIAGNOSIS — R5383 Other fatigue: Secondary | ICD-10-CM

## 2020-03-29 DIAGNOSIS — R0683 Snoring: Secondary | ICD-10-CM

## 2020-03-29 DIAGNOSIS — G4710 Hypersomnia, unspecified: Secondary | ICD-10-CM

## 2020-04-03 ENCOUNTER — Encounter: Payer: Self-pay | Admitting: Obstetrics and Gynecology

## 2020-04-03 ENCOUNTER — Other Ambulatory Visit: Payer: Self-pay

## 2020-04-03 ENCOUNTER — Ambulatory Visit (INDEPENDENT_AMBULATORY_CARE_PROVIDER_SITE_OTHER): Payer: Medicare Other | Admitting: Obstetrics and Gynecology

## 2020-04-03 ENCOUNTER — Ambulatory Visit: Payer: Medicare Other | Admitting: Obstetrics and Gynecology

## 2020-04-03 ENCOUNTER — Other Ambulatory Visit (HOSPITAL_COMMUNITY)
Admission: RE | Admit: 2020-04-03 | Discharge: 2020-04-03 | Disposition: A | Discharge status: Home / Self Care | Payer: Medicare Other | Source: Ambulatory Visit | Attending: Obstetrics and Gynecology | Admitting: Obstetrics and Gynecology

## 2020-04-03 VITALS — BP 160/89 | HR 105 | Ht 69.0 in | Wt 305.6 lb

## 2020-04-03 DIAGNOSIS — R9389 Abnormal findings on diagnostic imaging of other specified body structures: Secondary | ICD-10-CM

## 2020-04-03 NOTE — Progress Notes (Signed)
70 yo postmenopausal here to discuss results of the ultrasound. Julie Richards remains without complaints. Ultrasound report reviewed with the Julie Richards which demonstrates a thickened endometrium. Julie Richards denies any history of postmenopausal vaginal bleeding  Past Medical History:  Diagnosis Date  . Osteoarthritis    Past Surgical History:  Procedure Laterality Date  . IR RADIOLOGIST EVAL & MGMT  08/06/2018  . IR RADIOLOGIST EVAL & MGMT  08/13/2018  . IR SINUS/FIST TUBE CHK-NON GI  09/02/2018  . LAPAROSCOPIC GASTRIC BANDING    . LAPAROSCOPIC ILEOCECECTOMY N/A 09/09/2018   Procedure: LAPAROSCOPIC ASSISTED ILEOCECECTOMY;  Surgeon: Harriette Bouillon, MD;  Location: [facility] OR;  Service: General;  Laterality: N/A;   History reviewed. No pertinent family history. Social History   Tobacco Use  . Smoking status: Never Smoker  . Smokeless tobacco: Never Used  Vaping Use  . Vaping Use: Never used  Substance Use Topics  . Alcohol use: Never  . Drug use: Never   ROS See pertinent in HPI. All other systems reviewed and non contributory  Blood pressure (!) 160/89, pulse (!) 105, height 5\' 9"  (1.753 m), weight (!) 305 lb 9.6 oz (138.6 kg). GENERAL: Well-developed, well-nourished female in no acute distress.  ABDOMEN: Soft, nontender, nondistended. No organomegaly. PELVIC: Normal external female genitalia. Vagina is pale and atrophic.  Normal discharge. Normal appearing cervix. Uterus is normal in size. No adnexal mass or tenderness. EXTREMITIES: No cyanosis, clubbing, or edema, 2+ distal pulses.  PELVIC COMPLETE WITH TRANSVAGINAL  Result Date: 03/29/2020 CLINICAL DATA:  Pelvic pain EXAM: TRANSABDOMINAL AND TRANSVAGINAL ULTRASOUND OF PELVIS TECHNIQUE: Both transabdominal and transvaginal ultrasound examinations of the pelvis were performed. Transabdominal technique was performed for global imaging of the pelvis including uterus, ovaries, adnexal regions, and pelvic cul-de-sac. It was necessary to proceed with  endovaginal exam following the transabdominal exam to visualize the ovaries bilaterally and endometrium. COMPARISON:  None FINDINGS: Uterus Measurements: 9.3 x 6.4 x 6.0 cm = volume: 189 mL. Multiple partially calcified heterogeneously hypoechoic masses are seen within the uterine fundus most in keeping with multiple uterine fibroids, the largest measuring 4.0 x 2.5 x 2.9 cm. Endometrium Thickness: Up to 10 mm. The endometrial stripe is heterogeneously thickened, most notably within the right fundus, with areas of hypoechogenicity noted within the mid fundus possibly representing areas of necrosis. Right ovary Not optimally visualized due to depth of the structure and intervening bowel. Measurements: 3.3 x 1.6 x 2.7 cm = volume: 8 mL. No definite adnexal mass Left ovary Not optimally visualized due to depth of the structure an intervening bowel. Measurements: 2.6 x 1.9 x 2.1 cm = volume: 5 mL. No definite adnexal mass Other findings No abnormal free fluid. IMPRESSION: Limited examination due to the Julie Richards's body habitus. Heterogeneous thickening of the endometrium demonstrating heterogeneous echogenicity. This may reflect changes of endometrial hyperplasia or endometrial carcinoma in this postmenopausal Julie Richards. Endometrial sampling is recommended for further evaluation. Multiple partially calcified uterine masses most in keeping with involuted uterine fibroids. Electronically Signed   By: 05/29/2020 MD   On: 03/29/2020 02:08   A/P 70 yo with thickened endometrium - Discussed benefits of endometrial biopsy ENDOMETRIAL BIOPSY     The indications for endometrial biopsy were reviewed.   Risks of the biopsy including cramping, bleeding, infection, uterine perforation, inadequate specimen and need for additional procedures  were discussed. The Julie Richards states she understands and agrees to undergo procedure today. Consent was signed. Time out was performed. Urine HCG was negative. A sterile speculum was placed  in the Julie Richards's vagina and the cervix was prepped with Betadine. A single-toothed tenaculum was placed on the anterior lip of the cervix to stabilize it. The uterine cavity was sounded to a depth of 7 cm using the uterine sound. The 3 mm pipelle was introduced into the endometrial cavity without difficulty, 2 passes were made.  A  scant amount of tissue was  sent to pathology. The instruments were removed from the Julie Richards's vagina. Minimal bleeding from the cervix was noted. The Julie Richards tolerated the procedure well.  Routine post-procedure instructions were given to the Julie Richards. The Julie Richards will follow up in two weeks to review the results and for further management.

## 2020-04-03 NOTE — Progress Notes (Signed)
Patient presents to discuss u/s results and possible endometrial biopsy. Patient is very anxious about results.

## 2020-04-05 LAB — SURGICAL PATHOLOGY

## 2020-04-06 ENCOUNTER — Other Ambulatory Visit: Payer: Self-pay | Admitting: Obstetrics and Gynecology

## 2020-04-06 MED ORDER — MISOPROSTOL 200 MCG PO TABS: ORAL_TABLET | ORAL | 1 refills | Status: DC

## 2020-04-07 ENCOUNTER — Encounter: Payer: Self-pay | Admitting: *Deleted

## 2020-04-07 ENCOUNTER — Telehealth: Payer: Self-pay | Admitting: *Deleted

## 2020-04-07 NOTE — Telephone Encounter (Signed)
Call to patient: Surgery scheduled for 04-13-2020 at [facility].  Instructed to arrive by 1215. Advised Pre-Op Department will call with instructions.

## 2020-04-07 NOTE — Telephone Encounter (Signed)
-----   Message from Catalina Antigua, MD sent at 04/06/2020 10:58 AM EDT ----- Please schedule patient for dilatation and curettage with hysteroscopy due to thickened endometrium  Thanks  Peggy

## 2020-04-11 ENCOUNTER — Other Ambulatory Visit (HOSPITAL_COMMUNITY)
Admission: RE | Admit: 2020-04-11 | Discharge: 2020-04-11 | Disposition: A | Discharge status: Home / Self Care | Payer: Medicare Other | Source: Ambulatory Visit | Attending: Obstetrics and Gynecology | Admitting: Obstetrics and Gynecology

## 2020-04-11 DIAGNOSIS — Z01812 Encounter for preprocedural laboratory examination: Secondary | ICD-10-CM | POA: Diagnosis present

## 2020-04-11 DIAGNOSIS — Z20822 Contact with and (suspected) exposure to covid-19: Secondary | ICD-10-CM | POA: Diagnosis not present

## 2020-04-11 LAB — SARS CORONAVIRUS 2 (TAT 6-24 HRS): SARS Coronavirus 2: NEGATIVE

## 2020-04-12 ENCOUNTER — Encounter (HOSPITAL_COMMUNITY): Payer: Self-pay | Admitting: Obstetrics and Gynecology

## 2020-04-12 ENCOUNTER — Other Ambulatory Visit: Payer: Self-pay

## 2020-04-12 NOTE — Progress Notes (Signed)
PCP - Dr. Concepcion Elk Cardiologist - denies  Chest x-ray -  EKG -  Stress Test -  ECHO -  Cardiac Cath -   Sleep Study - pending 04/29/20  CPAP -   COVID TEST- 04/11/20  Anesthesia review: n/a  -------------  SDW INSTRUCTIONS:  Your procedure is scheduled on 04/13/20. Please report to [facility] Main Entrance "A" at 11:30 A.M., and check in at the Admitting office. Call this number if you have problems the morning of surgery: [phone]   Remember: Do not eat or drink after midnight the night before your surgery  Medications to take morning of surgery with a sip of water include: acetaminophen (TYLENOL) cetirizine (ZYRTEC)   As of today, STOP taking any Aspirin (unless otherwise instructed by your surgeon), Aleve, Naproxen, Ibuprofen, Motrin, Advil, Goody's, BC's, all herbal medications, fish oil, and all vitamins.    The Morning of Surgery Do not wear jewelry, make-up or nail polish. Do not wear lotions, powders, or perfumes, or deodorant Do not shave 48 hours prior to surgery.   Do not bring valuables to the hospital. [facility] is not responsible for any belongings or valuables. If you are a smoker, DO NOT Smoke 24 hours prior to surgery If you wear a CPAP at night please bring your mask the morning of surgery  Remember that you must have someone to transport you home after your surgery, and remain with you for 24 hours if you are discharged the same day. Please bring cases for contacts, glasses, hearing aids, dentures or bridgework because it cannot be worn into surgery.   Patients discharged the day of surgery will not be allowed to drive home.   Please shower the NIGHT BEFORE SURGERY and the MORNING OF SURGERY with DIAL Soap. Wear comfortable clothes the morning of surgery. Oral Hygiene is also important to reduce your risk of infection.  Remember - BRUSH YOUR TEETH THE MORNING OF SURGERY WITH YOUR REGULAR TOOTHPASTE  Patient denies shortness of breath, fever, cough  and chest pain.

## 2020-04-13 ENCOUNTER — Encounter (HOSPITAL_COMMUNITY)
Admission: RE | Disposition: A | Discharge status: Home / Self Care | Payer: Self-pay | Source: Home / Self Care | Attending: Obstetrics and Gynecology

## 2020-04-13 ENCOUNTER — Encounter (HOSPITAL_COMMUNITY): Payer: Self-pay | Admitting: Obstetrics and Gynecology

## 2020-04-13 ENCOUNTER — Ambulatory Visit (HOSPITAL_COMMUNITY): Payer: Medicare Other | Admitting: Anesthesiology

## 2020-04-13 ENCOUNTER — Ambulatory Visit (HOSPITAL_COMMUNITY)
Admission: RE | Admit: 2020-04-13 | Discharge: 2020-04-13 | Disposition: A | Discharge status: Home / Self Care | Payer: Medicare Other | Attending: Obstetrics and Gynecology | Admitting: Obstetrics and Gynecology

## 2020-04-13 DIAGNOSIS — R9389 Abnormal findings on diagnostic imaging of other specified body structures: Secondary | ICD-10-CM | POA: Insufficient documentation

## 2020-04-13 DIAGNOSIS — N840 Polyp of corpus uteri: Secondary | ICD-10-CM | POA: Diagnosis not present

## 2020-04-13 DIAGNOSIS — Z885 Allergy status to narcotic agent status: Secondary | ICD-10-CM | POA: Diagnosis not present

## 2020-04-13 DIAGNOSIS — Z9884 Bariatric surgery status: Secondary | ICD-10-CM | POA: Insufficient documentation

## 2020-04-13 DIAGNOSIS — Z780 Asymptomatic menopausal state: Secondary | ICD-10-CM | POA: Diagnosis not present

## 2020-04-13 HISTORY — PX: HYSTEROSCOPY WITH D & C: SHX1775

## 2020-04-13 LAB — ABO/RH: ABO/RH(D): B POS

## 2020-04-13 LAB — TYPE AND SCREEN
ABO/RH(D): B POS
Antibody Screen: NEGATIVE

## 2020-04-13 LAB — CBC
HCT: 42.7 % (ref 36.0–46.0)
Hemoglobin: 13.5 g/dL (ref 12.0–15.0)
MCH: 27.8 pg (ref 26.0–34.0)
MCHC: 31.6 g/dL (ref 30.0–36.0)
MCV: 88 fL (ref 80.0–100.0)
Platelets: 176 10*3/uL (ref 150–400)
RBC: 4.85 MIL/uL (ref 3.87–5.11)
RDW: 14.5 % (ref 11.5–15.5)
WBC: 8.1 10*3/uL (ref 4.0–10.5)
nRBC: 0 % (ref 0.0–0.2)

## 2020-04-13 SURGERY — DILATATION AND CURETTAGE /HYSTEROSCOPY
Anesthesia: General

## 2020-04-13 MED ORDER — CHLOROPROCAINE HCL 1 % IJ SOLN
INTRAMUSCULAR | Status: AC
  Filled 2020-04-13: qty 30

## 2020-04-13 MED ORDER — GLYCOPYRROLATE PF 0.2 MG/ML IJ SOSY
PREFILLED_SYRINGE | INTRAMUSCULAR | Status: AC
  Filled 2020-04-13: qty 1

## 2020-04-13 MED ORDER — LIDOCAINE HCL (CARDIAC) PF 100 MG/5ML IV SOSY
PREFILLED_SYRINGE | INTRAVENOUS | Status: DC | PRN
  Administered 2020-04-13: 100 mg via INTRATRACHEAL

## 2020-04-13 MED ORDER — OXYCODONE HCL 5 MG/5ML PO SOLN: 5.0000 mg | Freq: Once | ORAL | Status: DC | PRN

## 2020-04-13 MED ORDER — ONDANSETRON HCL 4 MG/2ML IJ SOLN: 4.0000 mg | Freq: Once | INTRAMUSCULAR | Status: DC | PRN

## 2020-04-13 MED ORDER — ONDANSETRON HCL 4 MG/2ML IJ SOLN
INTRAMUSCULAR | Status: DC | PRN
  Administered 2020-04-13: 4 mg via INTRAVENOUS

## 2020-04-13 MED ORDER — MIDAZOLAM HCL 2 MG/2ML IJ SOLN
INTRAMUSCULAR | Status: AC
  Filled 2020-04-13: qty 2

## 2020-04-13 MED ORDER — POVIDONE-IODINE 10 % EX SWAB: 2.0000 "application " | Freq: Once | CUTANEOUS | Status: DC

## 2020-04-13 MED ORDER — LACTATED RINGERS IV SOLN: INTRAVENOUS | Status: DC | PRN

## 2020-04-13 MED ORDER — FENTANYL CITRATE (PF) 250 MCG/5ML IJ SOLN
INTRAMUSCULAR | Status: DC | PRN
  Administered 2020-04-13: 50 ug via INTRAVENOUS

## 2020-04-13 MED ORDER — CHLOROPROCAINE HCL 1 % IJ SOLN
INTRAMUSCULAR | Status: DC | PRN
  Administered 2020-04-13: 30 mL

## 2020-04-13 MED ORDER — ORAL CARE MOUTH RINSE: 15.0000 mL | Freq: Once | OROMUCOSAL | Status: DC

## 2020-04-13 MED ORDER — ONDANSETRON HCL 4 MG/2ML IJ SOLN
INTRAMUSCULAR | Status: AC
  Filled 2020-04-13: qty 2

## 2020-04-13 MED ORDER — IBUPROFEN 600 MG PO TABS: 600.0000 mg | ORAL_TABLET | Freq: Four times a day (QID) | ORAL | 3 refills | Status: AC | PRN

## 2020-04-13 MED ORDER — OXYCODONE-ACETAMINOPHEN 5-325 MG PO TABS: 1.0000 | ORAL_TABLET | ORAL | 0 refills | Status: AC | PRN

## 2020-04-13 MED ORDER — OXYCODONE HCL 5 MG PO TABS: 5.0000 mg | ORAL_TABLET | Freq: Once | ORAL | Status: DC | PRN

## 2020-04-13 MED ORDER — CHLORHEXIDINE GLUCONATE 0.12 % MT SOLN: 15.0000 mL | Freq: Once | OROMUCOSAL | Status: DC

## 2020-04-13 MED ORDER — SODIUM CHLORIDE 0.9 % IR SOLN
Status: DC | PRN
  Administered 2020-04-13: 3000 mL

## 2020-04-13 MED ORDER — FENTANYL CITRATE (PF) 100 MCG/2ML IJ SOLN: 25.0000 ug | INTRAMUSCULAR | Status: DC | PRN

## 2020-04-13 MED ORDER — DEXAMETHASONE SODIUM PHOSPHATE 10 MG/ML IJ SOLN
INTRAMUSCULAR | Status: AC
  Filled 2020-04-13: qty 1

## 2020-04-13 MED ORDER — PROPOFOL 10 MG/ML IV BOLUS
INTRAVENOUS | Status: AC
  Filled 2020-04-13: qty 40

## 2020-04-13 MED ORDER — FENTANYL CITRATE (PF) 250 MCG/5ML IJ SOLN
INTRAMUSCULAR | Status: AC
  Filled 2020-04-13: qty 5

## 2020-04-13 MED ORDER — PROPOFOL 10 MG/ML IV BOLUS
INTRAVENOUS | Status: DC | PRN
  Administered 2020-04-13: 300 mg via INTRAVENOUS

## 2020-04-13 MED ORDER — AMISULPRIDE (ANTIEMETIC) 5 MG/2ML IV SOLN: 10.0000 mg | Freq: Once | INTRAVENOUS | Status: DC | PRN

## 2020-04-13 MED ORDER — GLYCOPYRROLATE 0.2 MG/ML IJ SOLN
INTRAMUSCULAR | Status: DC | PRN
  Administered 2020-04-13 (×2): .1 mg via INTRAVENOUS

## 2020-04-13 MED ORDER — LACTATED RINGERS IV SOLN: INTRAVENOUS | Status: DC

## 2020-04-13 MED ORDER — PROPOFOL 10 MG/ML IV BOLUS
INTRAVENOUS | Status: AC
  Filled 2020-04-13: qty 20

## 2020-04-13 MED ORDER — MIDAZOLAM HCL 2 MG/2ML IJ SOLN
INTRAMUSCULAR | Status: DC | PRN
  Administered 2020-04-13: 2 mg via INTRAVENOUS

## 2020-04-13 MED ORDER — LIDOCAINE 2% (20 MG/ML) 5 ML SYRINGE
INTRAMUSCULAR | Status: AC
  Filled 2020-04-13: qty 5

## 2020-04-13 MED ORDER — DEXAMETHASONE SODIUM PHOSPHATE 10 MG/ML IJ SOLN
INTRAMUSCULAR | Status: DC | PRN
  Administered 2020-04-13: 8 mg via INTRAVENOUS

## 2020-04-13 SURGICAL SUPPLY — 11 items
CATH ROBINSON RED A/P 16FR (CATHETERS) ×2 IMPLANT
GLOVE SURG SS PI 6.5 STRL IVOR (GLOVE) ×2 IMPLANT
GLOVE SURG UNDER POLY LF SZ6.5 (GLOVE) ×2 IMPLANT
GLOVE SURG UNDER POLY LF SZ7 (GLOVE) ×2 IMPLANT
GOWN STRL REUS W/ TWL LRG LVL3 (GOWN DISPOSABLE) ×2 IMPLANT
GOWN STRL REUS W/TWL LRG LVL3 (GOWN DISPOSABLE) ×2
KIT PROCEDURE FLUENT (KITS) ×2 IMPLANT
KIT TURNOVER KIT B (KITS) ×2 IMPLANT
PACK VAGINAL MINOR WOMEN LF (CUSTOM PROCEDURE TRAY) ×2 IMPLANT
PAD OB MATERNITY 4.3X12.25 (PERSONAL CARE ITEMS) ×2 IMPLANT
TOWEL GREEN STERILE FF (TOWEL DISPOSABLE) ×4 IMPLANT

## 2020-04-13 NOTE — Transfer of Care (Signed)
Immediate Anesthesia Transfer of Care Note  Patient: Julie Richards  Procedure(s) Performed: DILATATION AND CURETTAGE /HYSTEROSCOPY (N/A )  Patient Location: PACU  Anesthesia Type:General  Level of Consciousness: drowsy and patient cooperative  Airway & Oxygen Therapy: Patient Spontanous Breathing and Patient connected to face mask oxygen  Post-op Assessment: Report given to RN and Post -op Vital signs reviewed and stable  Post vital signs: Reviewed and stable  Last Vitals:  Vitals Value Taken Time  BP 118/65 04/13/20 1548  Temp    Pulse 101 04/13/20 1548  Resp 24 04/13/20 1548  SpO2 93 % 04/13/20 1548  Vitals shown include unvalidated device data.  Last Pain:  Vitals:   04/13/20 1148  TempSrc: Oral         Complications: No complications documented.

## 2020-04-13 NOTE — Anesthesia Procedure Notes (Signed)
Procedure Name: LMA Insertion Date/Time: 04/13/2020 3:02 PM Performed by: Modena Morrow, CRNA Pre-anesthesia Checklist: Patient identified, Emergency Drugs available, Suction available and Patient being monitored Patient Re-evaluated:Patient Re-evaluated prior to induction Oxygen Delivery Method: Circle system utilized Preoxygenation: Pre-oxygenation with 100% oxygen Induction Type: IV induction Ventilation: Mask ventilation without difficulty LMA: LMA inserted LMA Size: 5.0 Number of attempts: 1 Placement Confirmation: positive ETCO2 and breath sounds checked- equal and bilateral Tube secured with: Tape Dental Injury: Teeth and Oropharynx as per pre-operative assessment

## 2020-04-13 NOTE — Anesthesia Preprocedure Evaluation (Addendum)
Anesthesia Evaluation  Patient identified by MRN, date of birth, ID band Patient awake    Reviewed: Allergy & Precautions, NPO status , Patient's Chart, lab work & pertinent test results  History of Anesthesia Complications Negative for: history of anesthetic complications  Airway Mallampati: II  TM Distance: >3 FB Neck ROM: Full    Dental  (+) Missing   Pulmonary neg pulmonary ROS,    Pulmonary exam normal        Cardiovascular negative cardio ROS Normal cardiovascular exam     Neuro/Psych negative neurological ROS     GI/Hepatic Neg liver ROS, GERD  ,  Endo/Other  Morbid obesity  Renal/GU negative Renal ROS  negative genitourinary   Musculoskeletal  (+) Arthritis ,   Abdominal   Peds  Hematology negative hematology ROS (+)   Anesthesia Other Findings   Reproductive/Obstetrics Thickened endometrium                            Anesthesia Physical Anesthesia Plan  ASA: III  Anesthesia Plan: General   Post-op Pain Management:    Induction: Intravenous  PONV Risk Score and Plan: 3 and Ondansetron, Dexamethasone, Midazolam and Treatment may vary due to age or medical condition  Airway Management Planned: LMA  Additional Equipment: None  Intra-op Plan:   Post-operative Plan: Extubation in OR  Informed Consent: I have reviewed the patients History and Physical, chart, labs and discussed the procedure including the risks, benefits and alternatives for the proposed anesthesia with the patient or authorized representative who has indicated his/her understanding and acceptance.     Dental advisory given  Plan Discussed with:   Anesthesia Plan Comments:         Anesthesia Quick Evaluation

## 2020-04-13 NOTE — Discharge Instructions (Signed)
Hysteroscopy Hysteroscopy is a procedure used to look inside a woman's womb (uterus). This may be done for various reasons, including:  To look for tumors and other growths in the uterus.  To evaluate abnormal bleeding, fibroid tumors, polyps, scar tissue, or uterine cancer.  To determine why a woman is unable to get pregnant or has had repeated pregnancy losses.  To locate an IUD (intrauterine device).  To place a birth control device into the fallopian tubes. During this procedure, a thin, flexible tube with a small light and camera (hysteroscope) is used to examine the uterus. The camera sends images to a monitor in the room so that your health care provider can view the inside of your uterus. A hysteroscopy should be done right after a menstrual period. Tell a health care provider about:  Any allergies you have.  All medicines you are taking, including vitamins, herbs, eye drops, creams, and over-the-counter medicines.  Any problems you or family members have had with anesthetic medicines.  Any blood disorders you have.  Any surgeries you have had.  Any medical conditions you have.  Whether you are pregnant or may be pregnant.  Whether you have been diagnosed with an STI (sexually transmitted infection) or you think you have an STI. What are the risks? Generally, this is a safe procedure. However, problems may occur, including:  Excessive bleeding.  Infection.  Damage to the uterus or other structures or organs.  Allergic reaction to medicines or fluids that are used in the procedure. What happens before the procedure? Staying hydrated Follow instructions from your health care provider about hydration, which may include:  Up to 2 hours before the procedure - you may continue to drink clear liquids, such as water, clear fruit juice, black coffee, and plain tea. Eating and drinking restrictions Follow instructions from your health care provider about eating and  drinking, which may include:  8 hours before the procedure - stop eating solid foods and drink clear liquids only.  2 hours before the procedure - stop drinking clear liquids. Medicines  Ask your health care provider about: ? Changing or stopping your regular medicines. This is especially important if you are taking diabetes medicines or blood thinners. ? Taking medicines such as aspirin and ibuprofen. These medicines can thin your blood. Do not take these medicines unless your health care provider tells you to take them. ? Taking over-the-counter medicines, vitamins, herbs, and supplements.  Medicine may be placed in your cervix the day before the procedure. This medicine causes the cervix to open (dilate). The larger opening makes it easier for the hysteroscope to be inserted into the uterus during the procedure. General instructions  Ask your health care provider: ? What steps will be taken to help prevent infection. These steps may include:  Washing skin with a germ-killing soap.  Taking antibiotic medicine.  Do not use any products that contain nicotine or tobacco for at least 4 weeks before the procedure. These products include cigarettes, chewing tobacco, and vaping devices, such as e-cigarettes. If you need help quitting, ask your health care provider.  Plan to have a responsible adult take you home from the hospital or clinic.  Plan to have a responsible adult care for you for the time you are told after you leave the hospital or clinic. This is important.  Empty your bladder before the procedure begins. What happens during the procedure?  An IV will be inserted into one of your veins.  You may be given: ?   A medicine to help you relax (sedative). ? A medicine that numbs the area around the cervix (local anesthetic). ? A medicine to make you fall asleep (general anesthetic).  A hysteroscope will be inserted through your vagina and into your uterus.  Air or fluid will  be used to enlarge your uterus to allow your health care provider to see it better. The amount of fluid used will be carefully checked throughout the procedure.  In some cases, tissue may be gently scraped from inside the uterus and sent to a lab for testing (biopsy). The procedure may vary among health care providers and hospitals. What can I expect after the procedure?  Your blood pressure, heart rate, breathing rate, and blood oxygen level will be monitored until you leave the hospital or clinic.  You may have cramps. You may be given medicines for this.  You may have bleeding, which may vary from light spotting to menstrual-like bleeding. This is normal.  If you had a biopsy, it is up to you to get the results. Ask your health care provider, or the department that is doing the procedure, when your results will be ready. Follow these instructions at home: Activity  Rest as told by your health care provider.  Return to your normal activities as told by your health care provider. Ask your health care provider what activities are safe for you.  If you were given a sedative during the procedure, it can affect you for several hours. Do not drive or operate machinery until your health care provider says that it is safe. Medicines  Do not take aspirin or other NSAIDs during recovery, as told by your healthcare provider. It can increase the risk of bleeding.  Ask your health care provider if the medicine prescribed to you: ? Requires you to avoid driving or using machinery. ? Can cause constipation. You may need to take these actions to prevent or treat constipation:  Drink enough fluid to keep your urine pale yellow.  Take over-the-counter or prescription medicines.  Eat foods that are high in fiber, such as beans, whole grains, and fresh fruits and vegetables.  Limit foods that are high in fat and processed sugars, such as fried or sweet foods. General instructions  Do not douche,  use tampons, or have sex for 2 weeks after the procedure, or until your health care provider approves.  Do not take baths, swim, or use a hot tub until your health care provider approves. Take showers instead of baths for 2 weeks, or for as long as told by your health care provider.  Keep all follow-up visits. This is important. Contact a health care provider if:  You feel dizzy or lightheaded.  You feel nauseous.  You have abnormal vaginal discharge.  You have a rash.  You have pain that does not get better with medicine.  You have chills. Get help right away if:  You have bleeding that is heavier than a normal menstrual period.  You have a fever.  You have pain or cramps that get worse.  You develop new abdominal pain.  You faint.  You have pain in your shoulder.  You are short of breath. Summary  Hysteroscopy is a procedure that is used to look inside a woman's womb (uterus).  After the procedure, you may have bleeding, which varies from light spotting to menstrual-like bleeding. This is normal. You may also have cramps.  Do not douche, use tampons, or have sex for 2 weeks after   the procedure, or until your health care provider approves.  Plan to have a responsible adult take you home from the hospital or clinic. This information is not intended to replace advice given to you by your health care provider. Make sure you discuss any questions you have with your health care provider. Document Revised: 08/25/2019 Document Reviewed: 08/25/2019 Elsevier Patient Education  2021 [employer]  

## 2020-04-13 NOTE — H&P (Signed)
Julie Richards is an 70 y.o. female postmenopausal with thickened endometrium on ultrasound here for D&C with hysteroscopy. Patient denies any history of postmenopausal vaginal bleeding. Endometrial biopsy in the office was inconclusive. Patient is without any complaints  Pertinent Gynecological History: Menses: post-menopausal Last mammogram: normal Date: 04/2019   Menstrual History: No LMP recorded (lmp unknown). Patient is postmenopausal.    Past Medical History:  Diagnosis Date  . Osteoarthritis     Past Surgical History:  Procedure Laterality Date  . APPENDECTOMY  2020  . IR RADIOLOGIST EVAL & MGMT  08/06/2018  . IR RADIOLOGIST EVAL & MGMT  08/13/2018  . IR SINUS/FIST TUBE CHK-NON GI  09/02/2018  . LAPAROSCOPIC GASTRIC BANDING    . LAPAROSCOPIC ILEOCECECTOMY N/A 09/09/2018   Procedure: LAPAROSCOPIC ASSISTED ILEOCECECTOMY;  Surgeon: Harriette Bouillon, MD;  Location: [facility] OR;  Service: General;  Laterality: N/A;    History reviewed. No pertinent family history.  Social History:  reports that she has never smoked. She has never used smokeless tobacco. She reports that she does not drink alcohol and does not use drugs.  Allergies:  Allergies  Allergen Reactions  . Oxycodone Hcl Nausea And Vomiting    Medications Prior to Admission  Medication Sig Dispense Refill Last Dose  . acetaminophen (TYLENOL) 650 MG CR tablet Take 1,950 mg by mouth every 8 (eight) hours as needed for pain.     . calcium carbonate (TUMS EX) 750 MG chewable tablet Chew 1 tablet by mouth daily.     . carboxymethylcellulose (REFRESH TEARS) 0.5 % SOLN Place 2 drops into both eyes 2 (two) times daily.     . cetirizine (ZYRTEC) 10 MG tablet Take 10 mg by mouth daily as needed for allergies.     . Cholecalciferol (VITAMIN D3) 25 MCG (1000 UT) CAPS Take 2,000 Units by mouth daily.     . diclofenac Sodium (VOLTAREN) 1 % GEL Apply 2 g topically daily as needed (pain).     Boris Lown Oil (OMEGA-3) 500 MG CAPS Take 500 mg  by mouth daily. Omega Red     . Multiple Vitamin (MULTIVITAMIN WITH MINERALS) TABS tablet Take 1 tablet by mouth daily.     Marland Kitchen OVER THE COUNTER MEDICATION Take 1 Bottle by mouth daily as needed (on exersice days). Five hour energy     . Potassium 99 MG TABS Take 99 mg by mouth daily.     . Psyllium (METAMUCIL PO) Take 10 mLs by mouth daily as needed (constipation after 3 day). Added Mineral oil     . Zinc 50 MG CAPS Take 50 mg by mouth daily.     Marland Kitchen acetaminophen (TYLENOL) 325 MG tablet Take 2 tablets (650 mg total) by mouth every 6 (six) hours as needed for mild pain (or temp > 100). (Patient not taking: Reported on 04/07/2020)   Not Taking at Unknown time  . misoprostol (CYTOTEC) 200 MCG tablet Insert four tablets vaginally the night prior to your appointment (Patient not taking: Reported on 04/07/2020) 4 tablet 1 Not Taking at Unknown time    Review of Systems See pertinent in HPI. All other systems are reviewed and non contributory Blood pressure (!) 141/75, pulse 92, temperature 98.5 F (36.9 C), temperature source Oral, height 5\' 9"  (1.753 m), weight 136.1 kg, SpO2 97 %. Physical Exam GENERAL: Well-developed, well-nourished female in no acute distress.  LUNGS: Clear to auscultation bilaterally.  HEART: Regular rate and rhythm. ABDOMEN: Soft, nontender, nondistended. No organomegaly. PELVIC: Deferred to OR EXTREMITIES:  No cyanosis, clubbing, or edema, 2+ distal pulses.  No results found for this or any previous visit (from the past 24 hour(s)).  No results found. US PELVIC COMPLETE WITH TRANSVAGINAL  Result Date: 03/29/2020 CLINICAL DATA:  Pelvic pain EXAM: TRANSABDOMINAL AND TRANSVAGINAL ULTRASOUND OF PELVIS TECHNIQUE: Both transabdominal and transvaginal ultrasound examinations of the pelvis were performed. Transabdominal technique was performed for global imaging of the pelvis including uterus, ovaries, adnexal regions, and pelvic cul-de-sac. It was necessary to proceed with endovaginal  exam following the transabdominal exam to visualize the ovaries bilaterally and endometrium. COMPARISON:  None FINDINGS: Uterus Measurements: 9.3 x 6.4 x 6.0 cm = volume: 189 mL. Multiple partially calcified heterogeneously hypoechoic masses are seen within the uterine fundus most in keeping with multiple uterine fibroids, the largest measuring 4.0 x 2.5 x 2.9 cm. Endometrium Thickness: Up to 10 mm. The endometrial stripe is heterogeneously thickened, most notably within the right fundus, with areas of hypoechogenicity noted within the mid fundus possibly representing areas of necrosis. Right ovary Not optimally visualized due to depth of the structure and intervening bowel. Measurements: 3.3 x 1.6 x 2.7 cm = volume: 8 mL. No definite adnexal mass Left ovary Not optimally visualized due to depth of the structure an intervening bowel. Measurements: 2.6 x 1.9 x 2.1 cm = volume: 5 mL. No definite adnexal mass Other findings No abnormal free fluid. IMPRESSION: Limited examination due to the patient's body habitus. Heterogeneous thickening of the endometrium demonstrating heterogeneous echogenicity. This may reflect changes of endometrial hyperplasia or endometrial carcinoma in this postmenopausal patient. Endometrial sampling is recommended for further evaluation. Multiple partially calcified uterine masses most in keeping with involuted uterine fibroids. Electronically Signed   By: Helyn Numbers MD   On: 03/29/2020 02:08    Assessment/Plan: 70 yo with thickened endometrium in postmenopausal state here for [facility] with hysteroscopy - Risks, benefits and alternatives were explained including but not limited to risks of bleeding, infection, uterine perforation and damage to adjacent organs. Patient verbalized understanding and all questions were answered  Julie Richards 04/13/2020, 12:03 PM

## 2020-04-13 NOTE — Op Note (Signed)
PREOPERATIVE DIAGNOSIS:  Postmenopausal thickened endometrium. POSTOPERATIVE DIAGNOSIS: The same PROCEDURE: Hysteroscopy, Dilation and Curettage. SURGEON:  Dr. Catalina Antigua   INDICATIONS: 69 y.o. [id number]  here for scheduled surgery for thickened endometrium.   Risks of surgery were discussed with the patient including but not limited to: bleeding which may require transfusion; infection which may require antibiotics; injury to uterus or surrounding organs; intrauterine scarring which may impair future fertility; need for additional procedures including laparotomy or laparoscopy; and other postoperative/anesthesia complications. Written informed consent was obtained.    FINDINGS:  A 8 week size uterus.  Atrophic endometrium with small sub centimeter polyp in right cornua.  Normal ostia bilaterally.  ANESTHESIA:   General, paracervical block. INTRAVENOUS FLUIDS:  800 ml of LR FLUID DEFICITS:  of normal saline ESTIMATED BLOOD LOSS:  Less than 20 ml SPECIMENS: Endometrial curettings sent to pathology COMPLICATIONS:  None immediate.  PROCEDURE DETAILS:  The patient received intravenous antibiotics while in the preoperative area.  She was then taken to the operating room where general anesthesia was administered and was found to be adequate.  After an adequate timeout was performed, she was placed in the dorsal lithotomy position and examined; then prepped and draped in the sterile manner.   Her bladder was catheterized for an unmeasured amount of clear, yellow urine. A speculum was then placed in the patient's vagina and a single tooth tenaculum was applied to the anterior lip of the cervix.   A paracervical block using 10 ml of 0.5% Marcaine was administered.  The cervix was sounded to 8 cm and dilated manually with Hagar dilators to accommodate the 5 mm diagnostic hysteroscope.  Once the cervix was dilated, the hysteroscope was inserted under direct visualization using normal saline as a  suspension medium.  The uterine cavity was carefully examined, both ostia were recognized, and diffusely proliferative endometrium was noted.   After further careful visualization of the uterine cavity, the hysteroscope was removed under direct visualization.  A sharp curettage was then performed to obtain a moderate amount of endometrial curettings.  The tenaculum was removed from the anterior lip of the cervix and the vaginal speculum was removed after noting good hemostasis.  The patient tolerated the procedure well and was taken to the recovery area awake, extubated and in stable condition.

## 2020-04-14 ENCOUNTER — Encounter (HOSPITAL_COMMUNITY): Payer: Self-pay | Admitting: Obstetrics and Gynecology

## 2020-04-14 LAB — SURGICAL PATHOLOGY

## 2020-04-14 NOTE — Anesthesia Postprocedure Evaluation (Signed)
Anesthesia Post Note  Patient: Julie Richards  Procedure(s) Performed: DILATATION AND CURETTAGE /HYSTEROSCOPY (N/A )     Patient location during evaluation: PACU Anesthesia Type: General Level of consciousness: sedated and patient cooperative Pain management: pain level controlled Vital Signs Assessment: post-procedure vital signs reviewed and stable Respiratory status: spontaneous breathing Cardiovascular status: stable Anesthetic complications: no   No complications documented.  Last Vitals:  Vitals:   04/13/20 1603 04/13/20 1618  BP: (!) 144/87 (!) 154/97  Pulse: 81 75  Resp: 20 (!) 21  Temp:  36.6 C  SpO2: 93% 94%    Last Pain:  Vitals:   04/13/20 1618  TempSrc:   PainSc: 0-No pain                 Lewie Loron

## 2020-04-27 ENCOUNTER — Encounter: Payer: Medicare Other | Admitting: Obstetrics and Gynecology

## 2020-04-29 ENCOUNTER — Other Ambulatory Visit: Payer: Self-pay

## 2020-04-29 ENCOUNTER — Ambulatory Visit (HOSPITAL_BASED_OUTPATIENT_CLINIC_OR_DEPARTMENT_OTHER): Payer: Medicare Other | Attending: Internal Medicine | Admitting: Internal Medicine

## 2020-04-29 DIAGNOSIS — R5383 Other fatigue: Secondary | ICD-10-CM | POA: Insufficient documentation

## 2020-04-29 DIAGNOSIS — R0683 Snoring: Secondary | ICD-10-CM

## 2020-04-29 DIAGNOSIS — G4710 Hypersomnia, unspecified: Secondary | ICD-10-CM | POA: Insufficient documentation

## 2020-05-02 DIAGNOSIS — R0683 Snoring: Secondary | ICD-10-CM | POA: Diagnosis not present

## 2020-05-02 NOTE — Procedures (Signed)
   Patient Name: Julie Richards, Julie Richards Date: 04/29/2020 Gender: Female D.O.B: May 15, 1950 Age (years): 69 Referring Provider: Fleet Contras Height (inches): 69 Interpreting Physician: Jetty Duhamel MD, ABSM Weight (lbs): 304 RPSGT: Shelah Lewandowsky BMI: 45 MRN: [id number] Neck Size: 16.75  CLINICAL INFORMATION Sleep Study Type: NPSG Indication for sleep study: Excessive Daytime Sleepiness, Fatigue, Morning Headaches, Obesity, Snoring, Witnessed Apneas Epworth Sleepiness Score: 16  SLEEP STUDY TECHNIQUE As per the AASM Manual for the Scoring of Sleep and Associated Events v2.3 (April 2016) with a hypopnea requiring 4% desaturations.  The channels recorded and monitored were frontal, central and occipital EEG, electrooculogram (EOG), submentalis EMG (chin), nasal and oral airflow, thoracic and abdominal wall motion, anterior tibialis EMG, snore microphone, electrocardiogram, and pulse oximetry.  MEDICATIONS Medications self-administered by patient taken the night of the study : TUMS E-X  SLEEP ARCHITECTURE The study was initiated at 9:44:55 PM and ended at 4:51:34 AM.  Sleep onset time was 42.8 minutes and the sleep efficiency was 38.2%%. The total sleep time was 163 minutes.  Stage REM latency was 288.0 minutes.  The patient spent 14.1%% of the night in stage N1 sleep, 76.7%% in stage N2 sleep, 0.0%% in stage N3 and 9.2% in REM.  Alpha intrusion was absent.  Supine sleep was 47.24%.  RESPIRATORY PARAMETERS The overall apnea/hypopnea index (AHI) was 3.3 per hour. There were 0 total apneas, including 0 obstructive, 0 central and 0 mixed apneas. There were 9 hypopneas and 9 RERAs.  The AHI during Stage REM sleep was 28.0 per hour.  AHI while supine was 7.0 per hour.  The mean oxygen saturation was 93.7%. The minimum SpO2 during sleep was 85.0%.  moderate snoring was noted during this study.  CARDIAC DATA The 2 lead EKG demonstrated sinus rhythm. The mean heart rate was  70.4 beats per minute. Other EKG findings include: None.  LEG MOVEMENT DATA The total PLMS were 0 with a resulting PLMS index of 0.0. Associated arousal with leg movement index was 0.0 .  IMPRESSIONS - No significant obstructive sleep apnea occurred during this study (AHI = 3.3/h). - Mild oxygen desaturation was noted during this study (Min O2 = 85.0%). Mean O2 sat 93.7%. - The patient snored with moderate snoring volume. - No cardiac abnormalities were noted during this study. - Clinically significant periodic limb movements did not occur during sleep. No significant associated arousals. - The patient was spontaneously awake from 11:30 PM to 2:00 AM.  DIAGNOSIS - Primary Snoring  RECOMMENDATIONS - Manage for snoring andd consider if insomnia may be a significant issue. - Sleep hygiene should be reviewed to assess factors that may improve sleep quality. - Weight management and regular exercise should be initiated or continued if appropriate.  [Electronically signed] 05/02/2020 02:34 PM  Jetty Duhamel MD, ABSM Diplomate, American Board of Sleep Medicine   NPI: [id number]                          Jetty Duhamel Diplomate, American Board of Sleep Medicine  ELECTRONICALLY SIGNED ON:  05/02/2020, 2:29 PM [facility] PH: [phone]   FX: [phone] ACCREDITED BY THE AMERICAN ACADEMY OF SLEEP MEDICINE

## 2020-05-18 ENCOUNTER — Encounter: Payer: Self-pay | Admitting: Obstetrics and Gynecology

## 2020-05-18 ENCOUNTER — Other Ambulatory Visit: Payer: Self-pay

## 2020-05-18 ENCOUNTER — Ambulatory Visit (INDEPENDENT_AMBULATORY_CARE_PROVIDER_SITE_OTHER): Payer: Medicare Other | Admitting: Obstetrics and Gynecology

## 2020-05-18 VITALS — BP 152/89 | HR 80 | Ht 69.0 in | Wt 308.0 lb

## 2020-05-18 DIAGNOSIS — Z9889 Other specified postprocedural states: Secondary | ICD-10-CM | POA: Diagnosis not present

## 2020-05-18 NOTE — Progress Notes (Signed)
70 yo here for post op check. Patient reports feeling well following her D&C hysteroscopy, polypectomy. She denies any pelvic pain or vaginal bleeding  Past Medical History:  Diagnosis Date  . Osteoarthritis    Past Surgical History:  Procedure Laterality Date  . APPENDECTOMY  2020  . HYSTEROSCOPY WITH D & C N/A 04/13/2020   Procedure: DILATATION AND CURETTAGE /HYSTEROSCOPY;  Surgeon: Catalina Antigua, MD;  Location: [facility] OR;  Service: Gynecology;  Laterality: N/A;  . IR RADIOLOGIST EVAL & MGMT  08/06/2018  . IR RADIOLOGIST EVAL & MGMT  08/13/2018  . IR SINUS/FIST TUBE CHK-NON GI  09/02/2018  . LAPAROSCOPIC GASTRIC BANDING    . LAPAROSCOPIC ILEOCECECTOMY N/A 09/09/2018   Procedure: LAPAROSCOPIC ASSISTED ILEOCECECTOMY;  Surgeon: Harriette Bouillon, MD;  Location: [facility] OR;  Service: General;  Laterality: N/A;   History reviewed. No pertinent family history. Social History   Tobacco Use  . Smoking status: Never Smoker  . Smokeless tobacco: Never Used  Vaping Use  . Vaping Use: Never used  Substance Use Topics  . Alcohol use: Never  . Drug use: Never   ROS  See pertinent in HPI. All other systems reviewed and non contributory Blood pressure (!) 152/89, pulse 80, height 5\' 9"  (1.753 m), weight (!) 308 lb (139.7 kg). GENERAL: Well-developed, well-nourished female in no acute distress.  NEURO: alert and oriented x 3  A/P 70 yo s/p D&C hysteroscopy for thickened endometrium here for post op check - Patient is medically cleared to resume all activities of daily living - RTC in a year or prn - Follow up with PCP

## 2020-05-18 NOTE — Progress Notes (Signed)
Pt is here today for post op s/p hysteroscopy/d&c 04/13/20. Pt states she is doing well after surgery. She states there was bleeding for a week after the surgery and then spotting a week after that but has since stopped.

## 2020-05-24 ENCOUNTER — Other Ambulatory Visit: Payer: Self-pay | Admitting: Internal Medicine

## 2020-05-24 DIAGNOSIS — Z1231 Encounter for screening mammogram for malignant neoplasm of breast: Secondary | ICD-10-CM

## 2020-08-07 ENCOUNTER — Other Ambulatory Visit: Payer: Self-pay | Admitting: Internal Medicine

## 2020-08-07 DIAGNOSIS — R1084 Generalized abdominal pain: Secondary | ICD-10-CM

## 2020-08-18 ENCOUNTER — Ambulatory Visit: Payer: Medicare Other

## 2020-08-18 ENCOUNTER — Other Ambulatory Visit: Payer: Medicare Other

## 2020-08-22 ENCOUNTER — Ambulatory Visit
Admission: RE | Admit: 2020-08-22 | Discharge: 2020-08-22 | Disposition: A | Discharge status: Home / Self Care | Payer: Medicare Other | Source: Ambulatory Visit | Attending: Internal Medicine | Admitting: Internal Medicine

## 2020-08-22 ENCOUNTER — Other Ambulatory Visit: Payer: Self-pay

## 2020-08-22 DIAGNOSIS — R1084 Generalized abdominal pain: Secondary | ICD-10-CM

## 2020-08-22 MED ORDER — IOPAMIDOL (ISOVUE-370) INJECTION 76%
80.0000 mL | Freq: Once | INTRAVENOUS | Status: AC | PRN
  Administered 2020-08-22: 80 mL via INTRAVENOUS

## 2020-09-14 ENCOUNTER — Other Ambulatory Visit: Payer: Self-pay

## 2020-09-14 ENCOUNTER — Ambulatory Visit: Payer: Medicare Other

## 2020-09-14 ENCOUNTER — Ambulatory Visit
Admission: RE | Admit: 2020-09-14 | Discharge: 2020-09-14 | Disposition: A | Discharge status: Home / Self Care | Payer: Medicare Other | Source: Ambulatory Visit | Attending: Internal Medicine | Admitting: Internal Medicine

## 2020-09-14 DIAGNOSIS — Z1231 Encounter for screening mammogram for malignant neoplasm of breast: Secondary | ICD-10-CM

## 2021-05-08 IMAGING — CT CT IMAGE GUIDED DRAINAGE BY PERCUTANEOUS CATHETER
1 of 3 series · 12 of 32 positions shown, 18 images · non-contrast
Comparison: none

INDICATION: 67-year-old female with ruptured appendicitis and right lower
quadrant peri appendiceal abscess. She presents for CT-guided drain
placement.

[Series 2: i-spiral 5.0 b40f · axial · 0.97mm/px · z∈[-205,-72]mm · 12 of 46 slices shown, 18 images]
[im 4/46  soft-tissue]
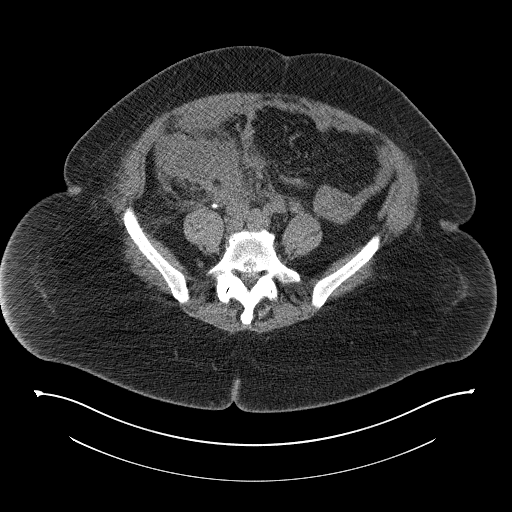
[im 4/46  bone]
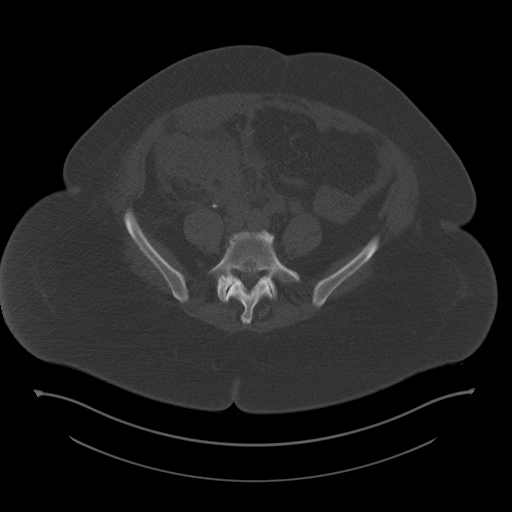
[im 7/46  soft-tissue]
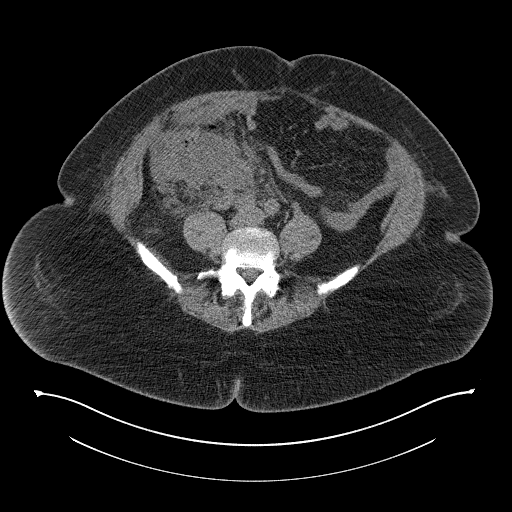
[im 11/46  soft-tissue]
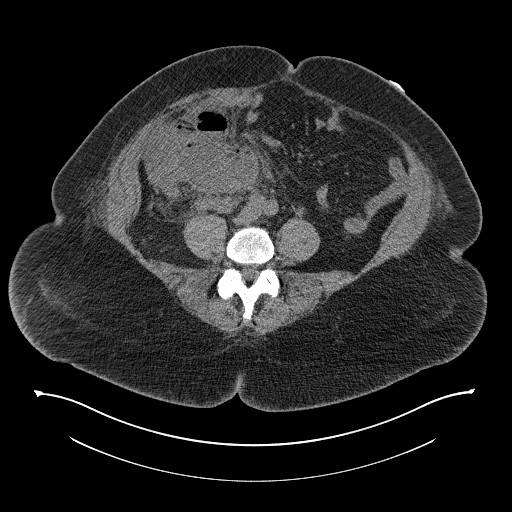
[im 14/46  soft-tissue]
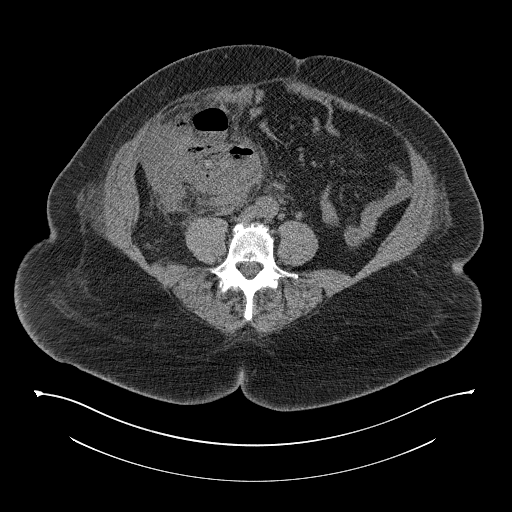
[im 18/46  soft-tissue]
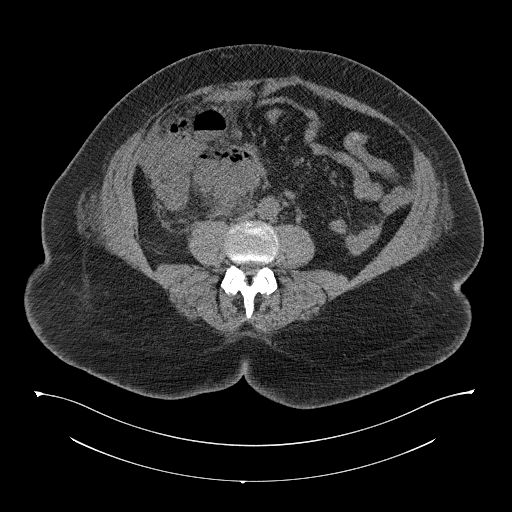
[im 21/46  soft-tissue]
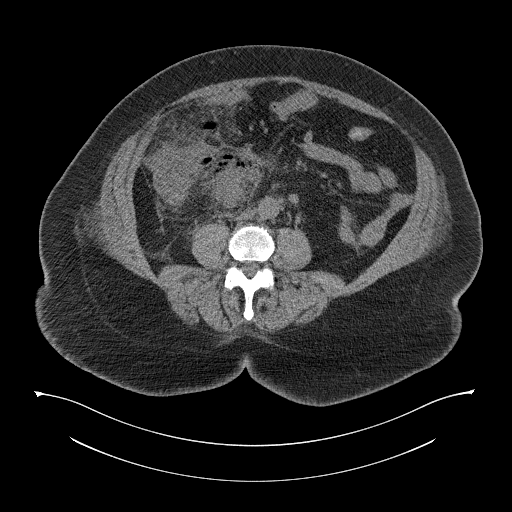
[im 25/46  soft-tissue]
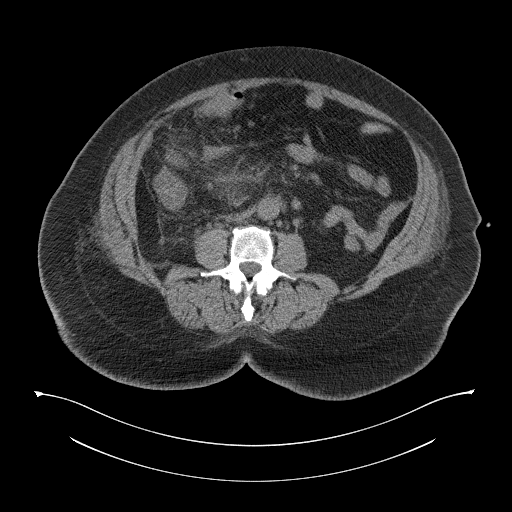
[im 28/46  soft-tissue]
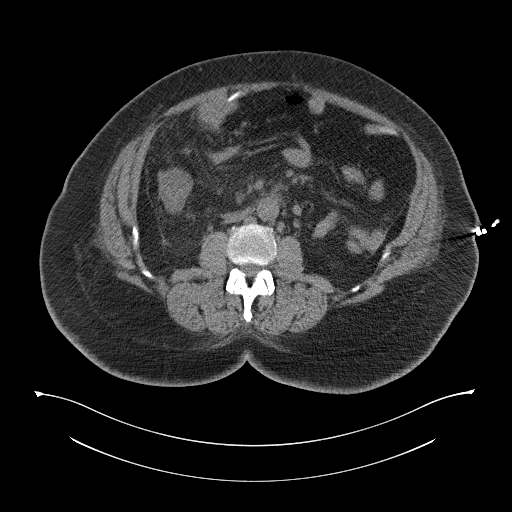
[im 32/46  soft-tissue]
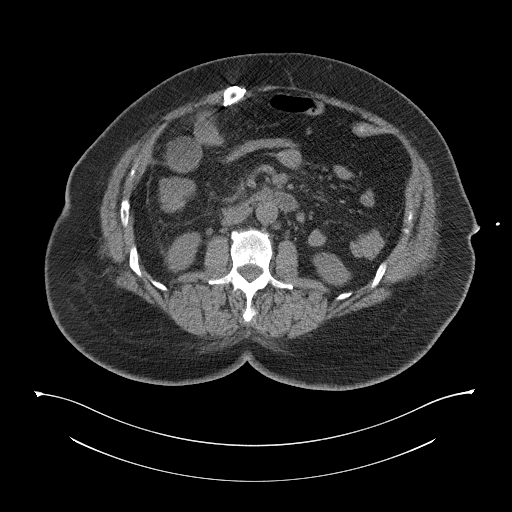
[im 32/46  lung]
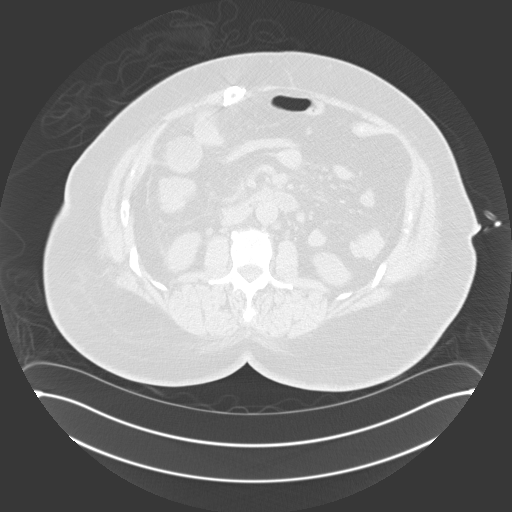
[im 32/46  bone]
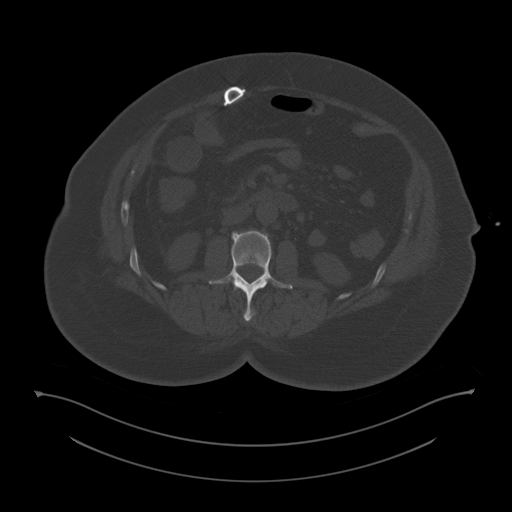
[im 35/46  soft-tissue]
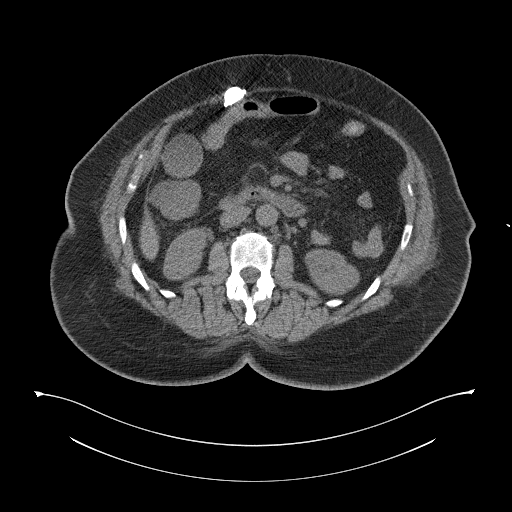
[im 35/46  lung]
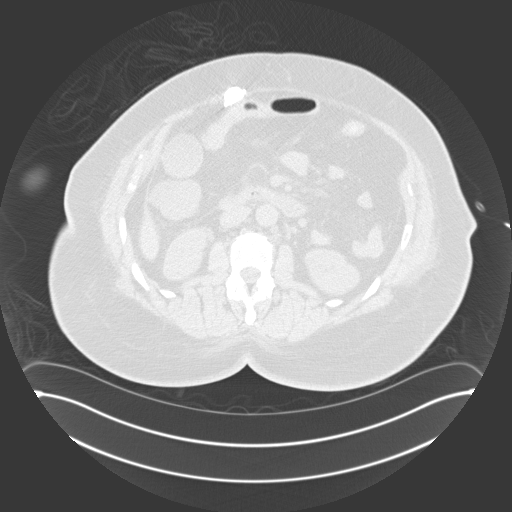
[im 39/46  soft-tissue]
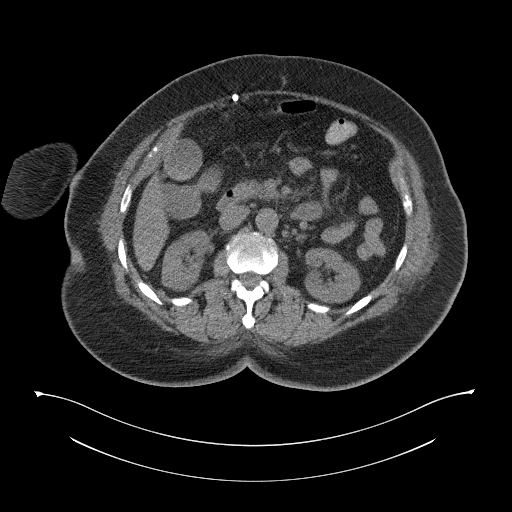
[im 39/46  lung]
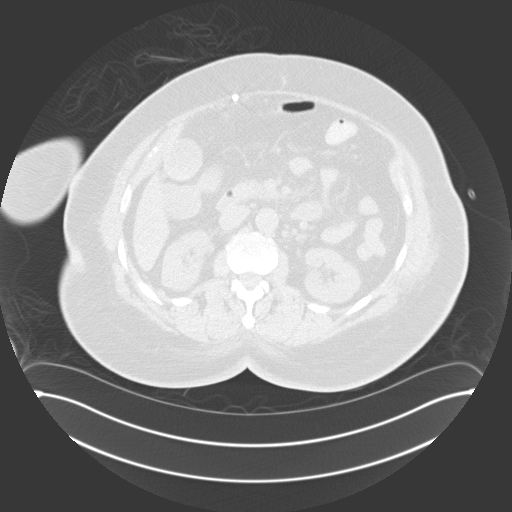
[im 42/46  soft-tissue]
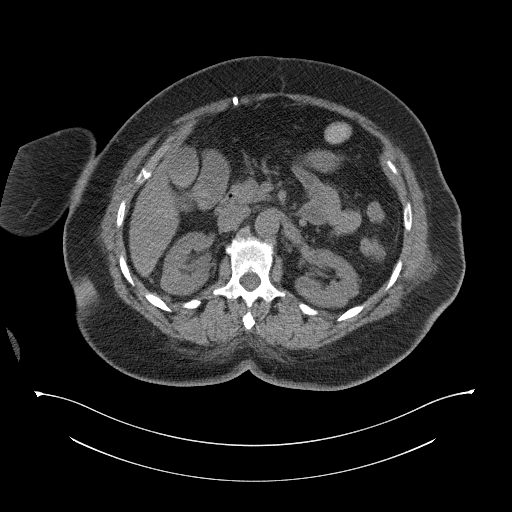
[im 42/46  lung]
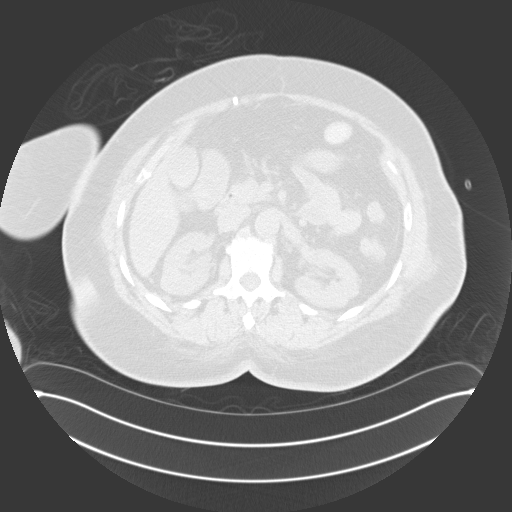

[12 of 32 positions shown; findings below may reference images not displayed]

EXAM:
CT-guided drain placement

MEDICATIONS:
The patient is currently admitted to the hospital and receiving
intravenous antibiotics. The antibiotics were administered within an
appropriate time frame prior to the initiation of the procedure.

ANESTHESIA/SEDATION:
Fentanyl 100 mcg IV; Versed 2 mg IV

Moderate Sedation Time:  23 minutes

The patient was continuously monitored during the procedure by the
interventional radiology nurse under my direct supervision.

COMPLICATIONS:
None immediate.

PROCEDURE:
Informed written consent was obtained from the patient after a
thorough discussion of the procedural risks, benefits and
alternatives. All questions were addressed. A timeout was performed
prior to the initiation of the procedure.

A planning axial CT scan was performed. The fluid and gas collection
in the right lower quadrant adjacent to the appendix was
successfully identified. A suitable skin entry site was selected and
marked. The overlying skin was sterilely prepped and draped in the
standard fashion using chlorhexidine skin prep. Local anesthesia was
attained by infiltration with 1% lidocaine.

Under intermittent CT guidance, an 18 gauge trocar needle was
carefully advanced through the right lower quadrant anterior
abdominal wall and into the fluid and gas collection. An Amplatz
wire was then coiled within the collection. A small dermatotomy was
made. The skin tract was dilated to 12 French. [REDACTED] French
drainage catheter was then advanced over the wire and formed in the
fluid collection. Aspiration yields nearly 200 mL of thick,
foul-smelling purulent fluid. A sample was sent for Gram stain and
culture. Additional CT imaging confirms excellent placement of the
drainage catheter and near-total resolution of the abscess cavity.

The abscess cavity was then lavaged with sterile saline in the drain
connected to JP bulb suction. The drain was then secured to the skin
with 0 Prolene suture. Sterile bandages were applied.
IMPRESSION: Technically successful placement of a 12 French drainage catheter
into the right lower quadrant peri-appendiceal abscess yielding
approximately 200 mL of thick, foul-smelling purulent fluid.

PLAN:
1. Maintain drain to JP bulb suction.
2. Flush at least once per shift.
3. Follow-up in drain clinic with CT scan of the abdomen and pelvis
and drain injection in 2 weeks.

## 2021-05-25 IMAGING — CT CT ABDOMEN AND PELVIS WITH CONTRAST
2 of 4 series · 13 of 46 positions shown, 15 images · IV contrast (iopamidol)
Comparison: 07/25/2018 and additional prior scans.

CLINICAL DATA: Perforated appendicitis with appendiceal abscess and
status post percutaneous catheter drainage abscess on 07/20/2018.
Recent follow-up study demonstrated resolution the appendiceal
abscess with some residual inflammation present as well as a tiny
fluid collection just deep to the right abdominal wall and anterior
to the gallbladder.

EXAM:
CT ABDOMEN AND PELVIS WITH CONTRAST
TECHNIQUE: Multidetector CT imaging of the abdomen and pelvis was performed
using the standard protocol following bolus administration of
intravenous contrast.
CONTRAST:  125mL ODLYRJ-HZZ IOPAMIDOL (ODLYRJ-HZZ) INJECTION 61%

[Series 2: abd pelvis 5.00 br40 s3 axial · axial · 0.78mm/px · z∈[+1135,+1535]mm · 10 of 96 slices shown, 12 images]
[im 8/96  soft-tissue]
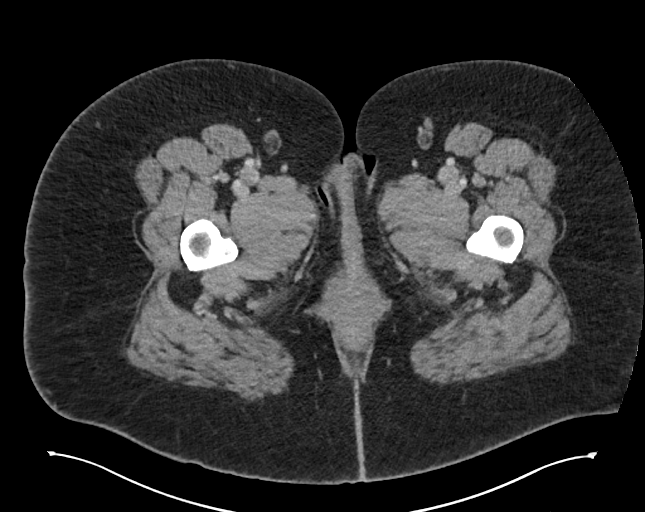
[im 8/96  bone]
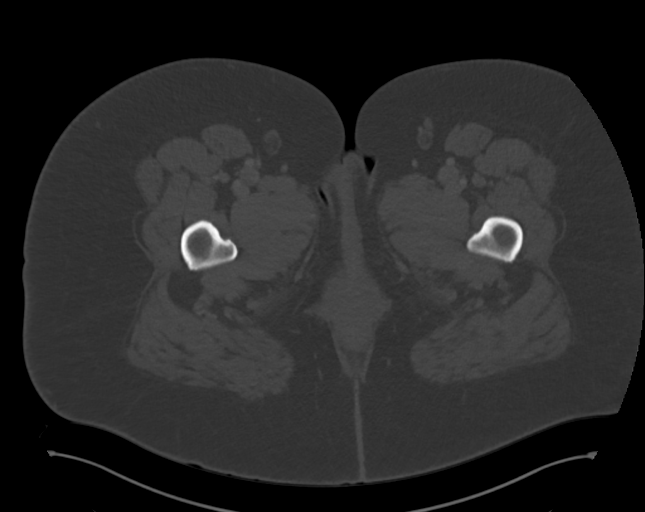
[im 16/96  soft-tissue]
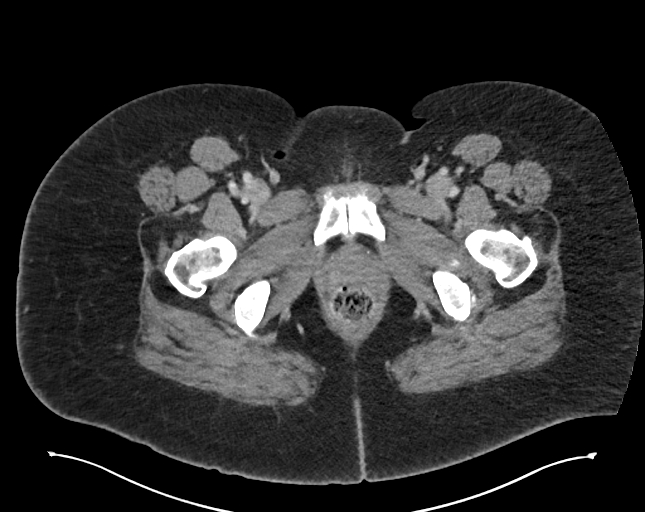
[im 24/96  soft-tissue]
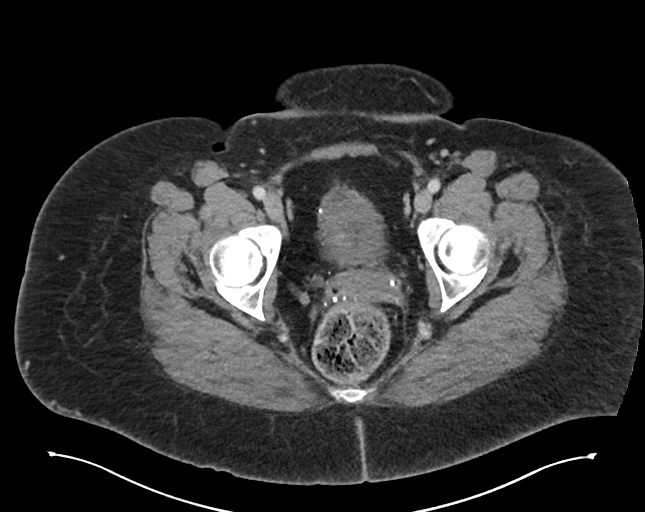
[im 36/96  soft-tissue]
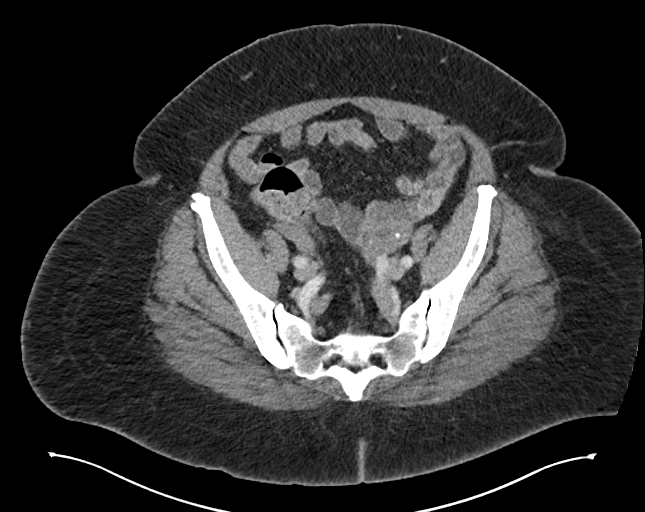
[im 44/96  soft-tissue]
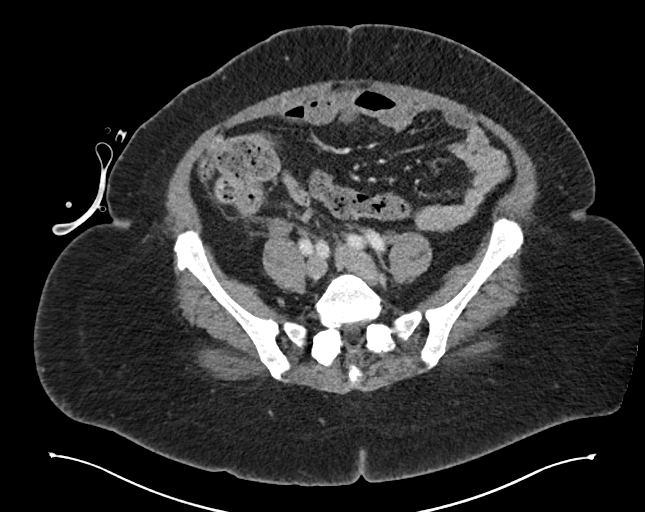
[im 52/96  soft-tissue]
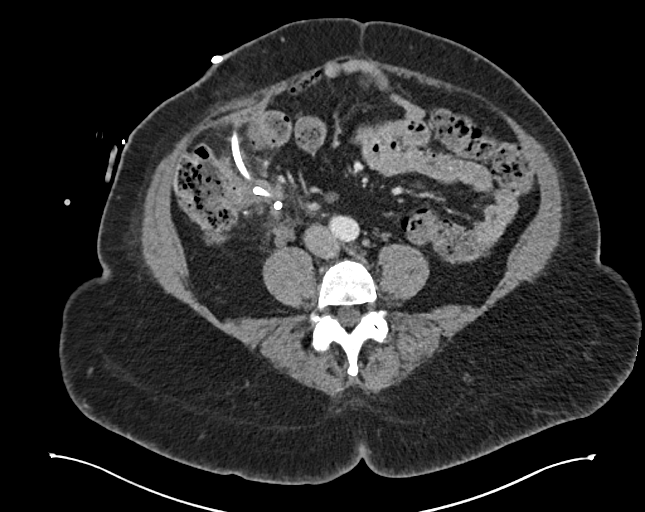
[im 60/96  soft-tissue]
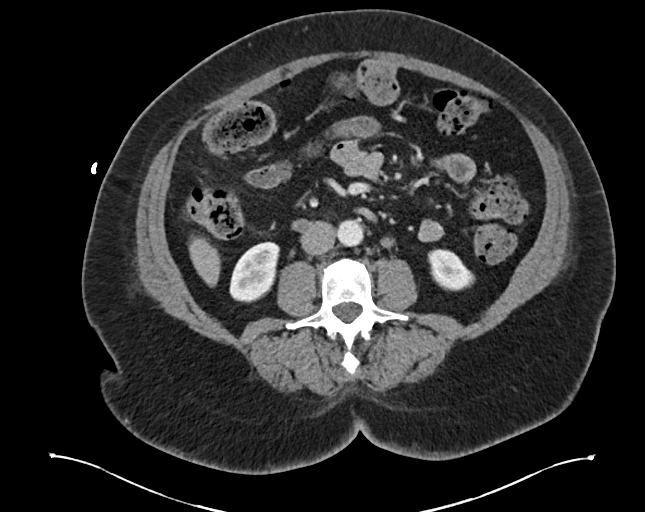
[im 72/96  soft-tissue]
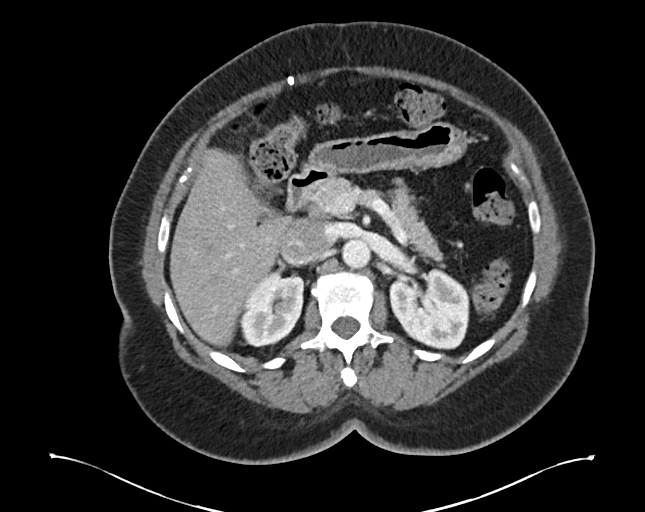
[im 80/96  soft-tissue]
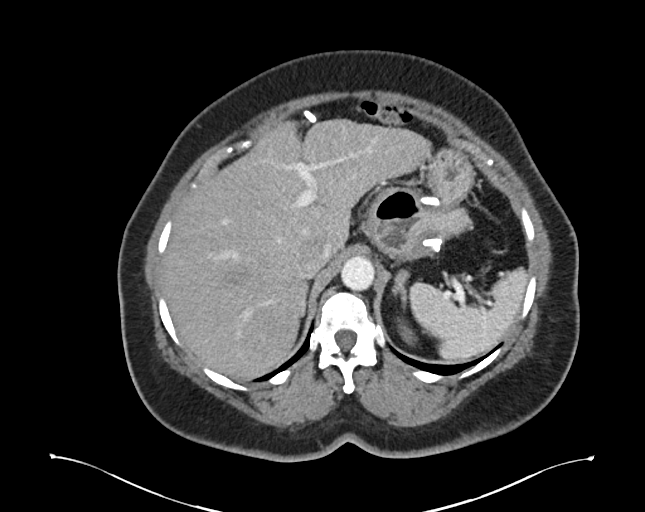
[im 80/96  bone]
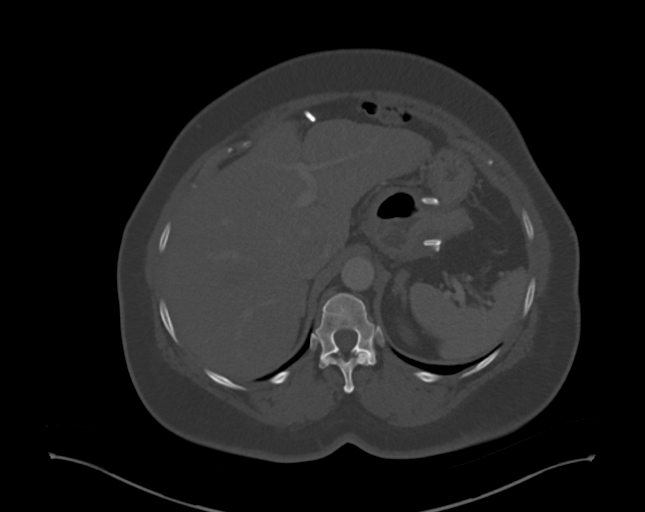
[im 88/96  soft-tissue]
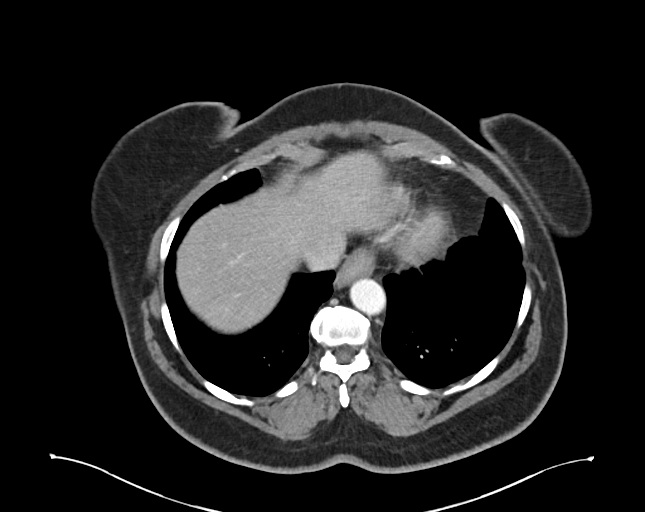

[Series 6: abd pelvis 2.00 br40 s3 cor · coronal · 0.94mm/px · 3 of 185 slices shown]
[im 62/185  soft-tissue]
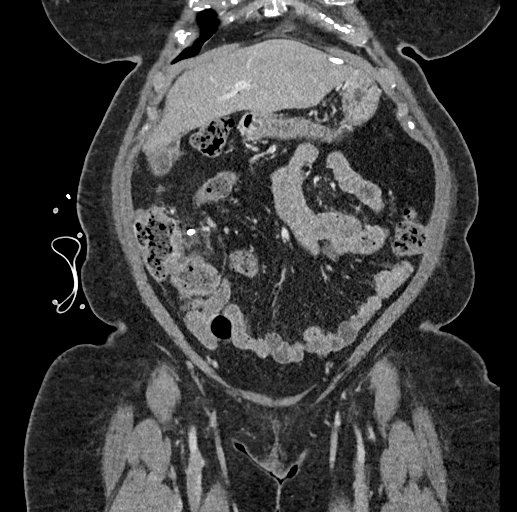
[im 82/185  soft-tissue]
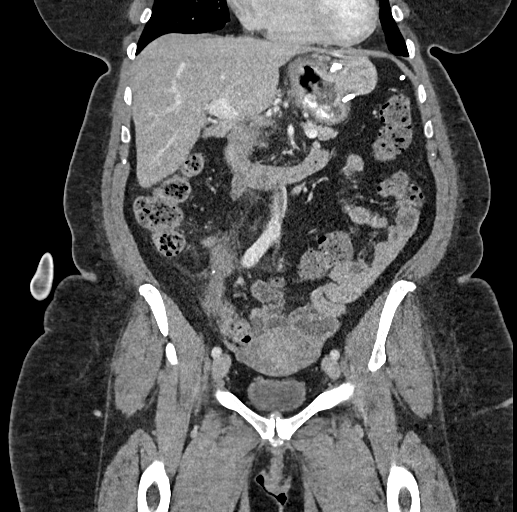
[im 103/185  soft-tissue]
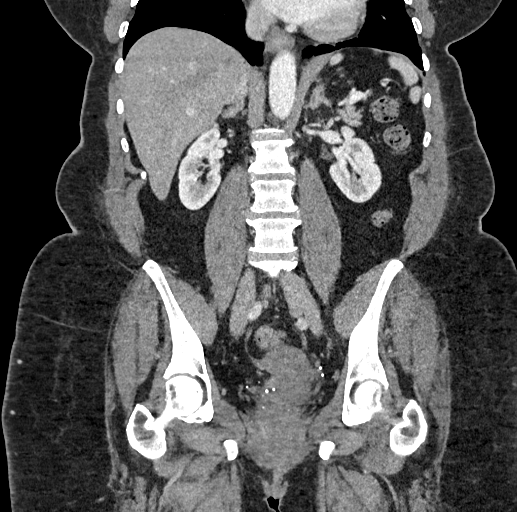

[13 of 46 positions shown; findings below may reference images not displayed]

FINDINGS: Lower chest: No acute abnormality.

Hepatobiliary: Stable cyst in posterior right lobe of the liver. The
gallbladder is contracted and unremarkable. No biliary ductal
dilatation.

Pancreas: Unremarkable. No pancreatic ductal dilatation or
surrounding inflammatory changes.

Spleen: Normal in size without focal abnormality.

Adrenals/Urinary Tract: Adrenal glands are unremarkable. Kidneys are
normal, without renal calculi, focal lesion, or hydronephrosis.
Bladder is unremarkable.

Stomach/Bowel: Bowel shows no evidence of obstruction, ileus or
significant inflammation. No free air identified. Inflammatory
thickening adjacent to the cecum and appendix in the right lower
quadrant has improved significantly since the prior scan with little
to no further acute appearing inflammation present. Stable
appearance of gastric band.

The appendix is very difficult to discretely identify but likely
lies very close to the percutaneous drain. The appendix is not
visibly grossly enlarged or thickened. There is no evidence of a
visible calcified appendicolith.

Vascular/Lymphatic: No significant vascular findings are present. No
enlarged abdominal or pelvic lymph nodes.

Reproductive: Uterus and bilateral adnexa are unremarkable.

Other: The tiny fluid collection deep to the right abdominal wall
and fairly close to the gallbladder has resolved since the prior
scan. No new abscess identified. At the level of the percutaneous
drain just medial to the cecum and adjacent to the appendix, no
further fluid collection is identified. There is no evidence of
extraluminal air or new abscess in this region.

Musculoskeletal: No acute or significant osseous findings.
IMPRESSION: 1. Resolved appendiceal abscess. Significant reduction in
inflammation adjacent to the cecum and involving the appendix. The
appendix is not visibly thickened and there is no evidence of an
appendicolith.
2. Resolved small fluid collection deep to the right abdominal wall
and anterior to the gallbladder on the prior scan 12 days ago. No
new fluid collections identified.

## 2021-09-05 DIAGNOSIS — R7303 Prediabetes: Secondary | ICD-10-CM | POA: Diagnosis not present

## 2021-09-05 DIAGNOSIS — Z1239 Encounter for other screening for malignant neoplasm of breast: Secondary | ICD-10-CM | POA: Diagnosis not present

## 2021-09-05 DIAGNOSIS — Z13220 Encounter for screening for lipoid disorders: Secondary | ICD-10-CM | POA: Diagnosis not present

## 2021-09-05 DIAGNOSIS — Z0000 Encounter for general adult medical examination without abnormal findings: Secondary | ICD-10-CM | POA: Diagnosis not present

## 2021-09-05 DIAGNOSIS — G5793 Unspecified mononeuropathy of bilateral lower limbs: Secondary | ICD-10-CM | POA: Diagnosis not present

## 2021-09-05 DIAGNOSIS — L700 Acne vulgaris: Secondary | ICD-10-CM | POA: Diagnosis not present

## 2021-09-05 DIAGNOSIS — E559 Vitamin D deficiency, unspecified: Secondary | ICD-10-CM | POA: Diagnosis not present

## 2021-09-05 DIAGNOSIS — N390 Urinary tract infection, site not specified: Secondary | ICD-10-CM | POA: Diagnosis not present

## 2021-10-18 ENCOUNTER — Other Ambulatory Visit: Payer: Self-pay | Admitting: Internal Medicine

## 2021-10-18 DIAGNOSIS — Z1231 Encounter for screening mammogram for malignant neoplasm of breast: Secondary | ICD-10-CM

## 2021-11-09 ENCOUNTER — Other Ambulatory Visit: Payer: Self-pay | Admitting: Internal Medicine

## 2021-11-09 DIAGNOSIS — R7303 Prediabetes: Secondary | ICD-10-CM | POA: Diagnosis not present

## 2021-11-09 DIAGNOSIS — N390 Urinary tract infection, site not specified: Secondary | ICD-10-CM | POA: Diagnosis not present

## 2021-11-09 DIAGNOSIS — Z23 Encounter for immunization: Secondary | ICD-10-CM | POA: Diagnosis not present

## 2021-11-09 DIAGNOSIS — F3489 Other specified persistent mood disorders: Secondary | ICD-10-CM | POA: Diagnosis not present

## 2021-11-12 LAB — URINE CULTURE
MICRO NUMBER:: 14079979
SPECIMEN QUALITY:: ADEQUATE

## 2021-11-20 ENCOUNTER — Ambulatory Visit
Admission: RE | Admit: 2021-11-20 | Discharge: 2021-11-20 | Disposition: A | Discharge status: Home / Self Care | Payer: Medicare HMO | Source: Ambulatory Visit | Attending: Internal Medicine | Admitting: Internal Medicine

## 2021-11-20 DIAGNOSIS — Z1231 Encounter for screening mammogram for malignant neoplasm of breast: Secondary | ICD-10-CM | POA: Diagnosis not present

## 2021-11-22 DIAGNOSIS — F0631 Mood disorder due to known physiological condition with depressive features: Secondary | ICD-10-CM | POA: Diagnosis not present

## 2021-11-22 DIAGNOSIS — F3489 Other specified persistent mood disorders: Secondary | ICD-10-CM | POA: Diagnosis not present

## 2022-01-01 ENCOUNTER — Other Ambulatory Visit: Payer: Self-pay | Admitting: Internal Medicine

## 2022-01-01 DIAGNOSIS — R7303 Prediabetes: Secondary | ICD-10-CM | POA: Diagnosis not present

## 2022-01-01 DIAGNOSIS — F3489 Other specified persistent mood disorders: Secondary | ICD-10-CM | POA: Diagnosis not present

## 2022-01-01 DIAGNOSIS — N390 Urinary tract infection, site not specified: Secondary | ICD-10-CM | POA: Diagnosis not present

## 2022-01-02 DIAGNOSIS — R7303 Prediabetes: Secondary | ICD-10-CM | POA: Diagnosis not present

## 2022-01-02 DIAGNOSIS — N390 Urinary tract infection, site not specified: Secondary | ICD-10-CM | POA: Diagnosis not present

## 2022-01-02 DIAGNOSIS — F3489 Other specified persistent mood disorders: Secondary | ICD-10-CM | POA: Diagnosis not present

## 2022-01-04 LAB — URINE CULTURE
MICRO NUMBER:: 14303497
SPECIMEN QUALITY:: ADEQUATE

## 2022-02-08 DIAGNOSIS — N3020 Other chronic cystitis without hematuria: Secondary | ICD-10-CM | POA: Diagnosis not present

## 2022-02-08 DIAGNOSIS — R102 Pelvic and perineal pain: Secondary | ICD-10-CM | POA: Diagnosis not present

## 2022-02-08 DIAGNOSIS — R8271 Bacteriuria: Secondary | ICD-10-CM | POA: Diagnosis not present

## 2022-02-15 DIAGNOSIS — R7303 Prediabetes: Secondary | ICD-10-CM | POA: Diagnosis not present

## 2022-02-15 DIAGNOSIS — F3489 Other specified persistent mood disorders: Secondary | ICD-10-CM | POA: Diagnosis not present

## 2022-03-01 DIAGNOSIS — F3489 Other specified persistent mood disorders: Secondary | ICD-10-CM | POA: Diagnosis not present

## 2022-03-18 DIAGNOSIS — G5793 Unspecified mononeuropathy of bilateral lower limbs: Secondary | ICD-10-CM | POA: Diagnosis not present

## 2022-03-18 DIAGNOSIS — F3489 Other specified persistent mood disorders: Secondary | ICD-10-CM | POA: Diagnosis not present

## 2022-09-19 ENCOUNTER — Ambulatory Visit: Payer: Medicare HMO | Admitting: Obstetrics and Gynecology

## 2022-11-19 ENCOUNTER — Ambulatory Visit: Payer: Medicare HMO | Admitting: Obstetrics and Gynecology

## 2024-02-26 ENCOUNTER — Encounter: Payer: Self-pay | Admitting: Gastroenterology
